# Patient Record
Sex: Male | Born: 1950 | Race: White | Hispanic: No | Smoking: Former smoker
Health system: Southern US, Community
[De-identification: ages and names within clinical notes are randomized; demographics above are authoritative.]

## PROBLEM LIST (undated history)

## (undated) DIAGNOSIS — I2510 Atherosclerotic heart disease of native coronary artery without angina pectoris: Secondary | ICD-10-CM

## (undated) DIAGNOSIS — Z9989 Dependence on other enabling machines and devices: Secondary | ICD-10-CM

## (undated) DIAGNOSIS — Z9981 Dependence on supplemental oxygen: Secondary | ICD-10-CM

## (undated) DIAGNOSIS — I255 Ischemic cardiomyopathy: Secondary | ICD-10-CM

## (undated) DIAGNOSIS — E785 Hyperlipidemia, unspecified: Secondary | ICD-10-CM

## (undated) DIAGNOSIS — E669 Obesity, unspecified: Secondary | ICD-10-CM

## (undated) DIAGNOSIS — I499 Cardiac arrhythmia, unspecified: Secondary | ICD-10-CM

## (undated) DIAGNOSIS — M1990 Unspecified osteoarthritis, unspecified site: Secondary | ICD-10-CM

## (undated) DIAGNOSIS — C801 Malignant (primary) neoplasm, unspecified: Secondary | ICD-10-CM

## (undated) DIAGNOSIS — Z720 Tobacco use: Secondary | ICD-10-CM

## (undated) DIAGNOSIS — K5090 Crohn's disease, unspecified, without complications: Secondary | ICD-10-CM

## (undated) DIAGNOSIS — E66812 Obesity, class 2: Secondary | ICD-10-CM

## (undated) DIAGNOSIS — G4733 Obstructive sleep apnea (adult) (pediatric): Secondary | ICD-10-CM

## (undated) DIAGNOSIS — Z951 Presence of aortocoronary bypass graft: Secondary | ICD-10-CM

## (undated) DIAGNOSIS — L409 Psoriasis, unspecified: Secondary | ICD-10-CM

## (undated) DIAGNOSIS — I4892 Unspecified atrial flutter: Secondary | ICD-10-CM

## (undated) DIAGNOSIS — Z9289 Personal history of other medical treatment: Secondary | ICD-10-CM

## (undated) DIAGNOSIS — I219 Acute myocardial infarction, unspecified: Secondary | ICD-10-CM

## (undated) DIAGNOSIS — I10 Essential (primary) hypertension: Secondary | ICD-10-CM

## (undated) DIAGNOSIS — J449 Chronic obstructive pulmonary disease, unspecified: Secondary | ICD-10-CM

## (undated) DIAGNOSIS — R7303 Prediabetes: Secondary | ICD-10-CM

## (undated) DIAGNOSIS — K219 Gastro-esophageal reflux disease without esophagitis: Secondary | ICD-10-CM

## (undated) HISTORY — DX: Dependence on other enabling machines and devices: Z99.89

## (undated) HISTORY — DX: Obesity, unspecified: E66.9

## (undated) HISTORY — DX: Tobacco use: Z72.0

## (undated) HISTORY — DX: Ischemic cardiomyopathy: I25.5

## (undated) HISTORY — DX: Psoriasis, unspecified: L40.9

## (undated) HISTORY — PX: XI ROBOTIC ASSISTED SIMPLE PROSTATECTOMY: SHX6713

## (undated) HISTORY — DX: Obstructive sleep apnea (adult) (pediatric): G47.33

## (undated) HISTORY — DX: Obesity, class 2: E66.812

## (undated) HISTORY — DX: Unspecified osteoarthritis, unspecified site: M19.90

## (undated) HISTORY — PX: CYSTOSCOPY: SHX5120

## (undated) HISTORY — DX: Presence of aortocoronary bypass graft: Z95.1

## (undated) HISTORY — DX: Hyperlipidemia, unspecified: E78.5

## (undated) HISTORY — DX: Atherosclerotic heart disease of native coronary artery without angina pectoris: I25.10

---

## 2000-04-04 ENCOUNTER — Ambulatory Visit (HOSPITAL_COMMUNITY): Admission: RE | Admit: 2000-04-04 | Discharge: 2000-04-04 | Payer: Self-pay | Admitting: Endocrinology

## 2000-04-04 ENCOUNTER — Encounter: Payer: Self-pay | Admitting: Endocrinology

## 2000-11-16 ENCOUNTER — Inpatient Hospital Stay (HOSPITAL_COMMUNITY): Admission: EM | Admit: 2000-11-16 | Discharge: 2000-11-17 | Payer: Self-pay | Admitting: Cardiology

## 2000-11-17 ENCOUNTER — Encounter: Payer: Self-pay | Admitting: Cardiology

## 2000-11-24 ENCOUNTER — Encounter: Payer: Self-pay | Admitting: Emergency Medicine

## 2000-11-24 ENCOUNTER — Inpatient Hospital Stay (HOSPITAL_COMMUNITY): Admission: EM | Admit: 2000-11-24 | Discharge: 2000-11-28 | Payer: Self-pay | Admitting: Emergency Medicine

## 2000-11-25 ENCOUNTER — Encounter: Payer: Self-pay | Admitting: *Deleted

## 2001-01-31 ENCOUNTER — Encounter: Payer: Self-pay | Admitting: *Deleted

## 2001-01-31 ENCOUNTER — Ambulatory Visit (HOSPITAL_COMMUNITY): Admission: RE | Admit: 2001-01-31 | Discharge: 2001-01-31 | Payer: Self-pay | Admitting: *Deleted

## 2002-07-08 ENCOUNTER — Encounter: Payer: Self-pay | Admitting: Emergency Medicine

## 2002-07-08 ENCOUNTER — Inpatient Hospital Stay (HOSPITAL_COMMUNITY): Admission: EM | Admit: 2002-07-08 | Discharge: 2002-07-12 | Payer: Self-pay | Admitting: Emergency Medicine

## 2002-07-09 ENCOUNTER — Encounter: Payer: Self-pay | Admitting: Internal Medicine

## 2005-04-30 DIAGNOSIS — Z951 Presence of aortocoronary bypass graft: Secondary | ICD-10-CM

## 2005-04-30 HISTORY — DX: Presence of aortocoronary bypass graft: Z95.1

## 2006-01-21 ENCOUNTER — Encounter: Admission: RE | Admit: 2006-01-21 | Discharge: 2006-01-21 | Payer: Self-pay | Admitting: *Deleted

## 2006-01-25 ENCOUNTER — Ambulatory Visit (HOSPITAL_COMMUNITY): Admission: RE | Admit: 2006-01-25 | Discharge: 2006-01-25 | Payer: Self-pay | Admitting: *Deleted

## 2006-02-14 ENCOUNTER — Inpatient Hospital Stay (HOSPITAL_COMMUNITY): Admission: RE | Admit: 2006-02-14 | Discharge: 2006-02-20 | Payer: Self-pay | Admitting: Cardiothoracic Surgery

## 2006-02-14 HISTORY — PX: CORONARY ARTERY BYPASS GRAFT: SHX141

## 2006-03-15 ENCOUNTER — Encounter: Admission: RE | Admit: 2006-03-15 | Discharge: 2006-03-15 | Payer: Self-pay | Admitting: Cardiothoracic Surgery

## 2006-03-28 ENCOUNTER — Encounter (HOSPITAL_COMMUNITY): Admission: RE | Admit: 2006-03-28 | Discharge: 2006-05-30 | Payer: Self-pay | Admitting: *Deleted

## 2006-04-28 ENCOUNTER — Inpatient Hospital Stay (HOSPITAL_COMMUNITY): Admission: EM | Admit: 2006-04-28 | Discharge: 2006-05-01 | Payer: Self-pay | Admitting: Emergency Medicine

## 2006-04-28 ENCOUNTER — Ambulatory Visit: Payer: Self-pay | Admitting: Internal Medicine

## 2006-04-30 DIAGNOSIS — C801 Malignant (primary) neoplasm, unspecified: Secondary | ICD-10-CM

## 2006-04-30 DIAGNOSIS — I219 Acute myocardial infarction, unspecified: Secondary | ICD-10-CM

## 2006-04-30 HISTORY — DX: Malignant (primary) neoplasm, unspecified: C80.1

## 2006-04-30 HISTORY — DX: Acute myocardial infarction, unspecified: I21.9

## 2006-05-30 ENCOUNTER — Inpatient Hospital Stay (HOSPITAL_COMMUNITY): Admission: RE | Admit: 2006-05-30 | Discharge: 2006-06-01 | Payer: Self-pay | Admitting: Urology

## 2006-07-04 ENCOUNTER — Ambulatory Visit (HOSPITAL_COMMUNITY): Admission: RE | Admit: 2006-07-04 | Discharge: 2006-07-04 | Payer: Self-pay | Admitting: *Deleted

## 2006-07-04 HISTORY — PX: CARDIAC CATHETERIZATION: SHX172

## 2010-05-21 ENCOUNTER — Encounter: Payer: Self-pay | Admitting: Cardiothoracic Surgery

## 2010-08-22 ENCOUNTER — Encounter (HOSPITAL_COMMUNITY): Payer: Self-pay

## 2010-08-29 ENCOUNTER — Ambulatory Visit (HOSPITAL_COMMUNITY)
Admission: RE | Admit: 2010-08-29 | Discharge: 2010-08-29 | Disposition: A | Discharge status: Home / Self Care | Source: Ambulatory Visit | Attending: Cardiovascular Disease | Admitting: Cardiovascular Disease

## 2010-08-29 DIAGNOSIS — R0602 Shortness of breath: Secondary | ICD-10-CM | POA: Insufficient documentation

## 2010-08-30 ENCOUNTER — Encounter (HOSPITAL_COMMUNITY): Payer: Self-pay

## 2012-04-10 ENCOUNTER — Other Ambulatory Visit (HOSPITAL_COMMUNITY): Payer: Self-pay | Admitting: Cardiovascular Disease

## 2012-04-10 DIAGNOSIS — I2510 Atherosclerotic heart disease of native coronary artery without angina pectoris: Secondary | ICD-10-CM

## 2012-05-01 ENCOUNTER — Ambulatory Visit (HOSPITAL_COMMUNITY)
Admission: RE | Admit: 2012-05-01 | Discharge: 2012-05-01 | Disposition: A | Discharge status: Home / Self Care | Source: Ambulatory Visit | Attending: Cardiovascular Disease | Admitting: Cardiovascular Disease

## 2012-05-01 DIAGNOSIS — I2510 Atherosclerotic heart disease of native coronary artery without angina pectoris: Secondary | ICD-10-CM | POA: Insufficient documentation

## 2012-05-01 MED ORDER — TECHNETIUM TC 99M SESTAMIBI GENERIC - CARDIOLITE
31.0000 | Freq: Once | INTRAVENOUS | Status: AC | PRN
  Administered 2012-05-01: 31 via INTRAVENOUS

## 2012-05-01 MED ORDER — REGADENOSON 0.4 MG/5ML IV SOLN
0.4000 mg | Freq: Once | INTRAVENOUS | Status: AC
  Administered 2012-05-01: 0.4 mg via INTRAVENOUS

## 2012-05-01 MED ORDER — TECHNETIUM TC 99M SESTAMIBI GENERIC - CARDIOLITE
10.5000 | Freq: Once | INTRAVENOUS | Status: AC | PRN
  Administered 2012-05-01: 11 via INTRAVENOUS

## 2012-05-01 NOTE — Procedures (Addendum)
[name] [address] [place] [address] [phone]  Cardiology Nuclear Med Study  Jay [name] is a 62 y.o. male     MRN : [id number]     DOB: 17-Mar-1951  Procedure Date: 05/01/2012  Nuclear Med Background Indication for Stress Test:  Graft Patency and Stent Patency History:   MI 2000, Stent 2000 and 2004, CABG 2007, CHF Cardiac Risk Factors: History of Smoking, Hypertension, Lipids and Obesity  Symptoms:  none   Nuclear Pre-Procedure Caffeine/Decaff Intake:  8:00pm NPO After: 6:00am   IV Site: R Antecubital  IV 0.9% NS with Angio Cath:  22g  Chest Size (in):  54 IV Started by: Koren Shiver, CNMT  Height: 5\' 9"  (1.753 m)  Cup Size: n/a  BMI:  Body mass index is 34.85 kg/(m^2). Weight:  236 lb (107.049 kg)   Tech Comments:  n/a    Nuclear Med Study 1 or 2 day study: 1 day  Stress Test Type:  Lexiscan  Order Authorizing Provider:  Susa Griffins, MD   Resting Radionuclide: Technetium 45m Sestamibi  Resting Radionuclide Dose: 10.5 mCi   Stress Radionuclide:  Technetium 22m Sestamibi  Stress Radionuclide Dose:31.0 mCi           Stress Protocol Rest HR: 52 Stress HR: 71  Rest BP: 129/76 Stress BP: 133/71  Exercise Time (min): n/a METS: n/a   Predicted Max HR: 161 bpm % Max HR: 44.1 bpm Rate Pressure Product: 9940   Dose of Adenosine (mg):  n/a Dose of Lexiscan: 0.4 mg  Dose of Atropine (mg): n/a Dose of Dobutamine: n/a mcg/kg/min (at max HR)  Stress Test Technologist: Esperanza Sheets, CCT Nuclear Technologist: Gonzella Lex, CNMT   Rest Procedure:  Myocardial perfusion imaging was performed at rest 45 minutes following the intravenous administration of Technetium 41m Sestamibi. Stress Procedure:  The patient received IV Lexiscan 0.4 mg over 15-seconds.  Technetium 48m Sestamibi injected at 30-seconds.  There were no significant changes with Lexiscan.  Quantitative spect images were obtained after a  45 minute delay.  Transient Ischemic Dilatation (Normal <1.22):  1.08 Lung/Heart Ratio (Normal <0.45):  0.27 QGS EDV: 157 ml QGS ESV:  96 ml LV Ejection Fraction: 39%  Signed by: Granjeno Cellar Vear Clock, CNMT  PHYSICIAN INTERPRETATION:  Rest ECG: NSR - Normal EKG  Stress ECG: No significant change from baseline ECG  QPS Raw Data Images:  There is interference from nuclear activity from structures below the diaphragm. This does not affect the ability to read the study. Patient motion noted.  Increased RV tracer uptake, suggests elevated pulmonary pressures. Stress Images:  There is decreased uptake in the lateral wall. -- basal to apical There is decreased uptake in the inferior - inferolateral wall mid to apical.  Moderate to large defect with moderate intensity. Rest Images:  Comparison with the stress images reveals no significant change.  Subtraction (SDS):  There is a fixed defect that is most consistent with a previous infarction. with perhaps trivial to mild peri-infarct ischemia in the apical inferolateral wall.  Impression Exercise Capacity:  Lexiscan with no exercise. BP Response:  Normal blood pressure response. Clinical Symptoms:  "Feeling a Bit Strange"; no Chest pain or SOB ECG Impression:  No significant ST segment change suggestive of ischemia. Comparison with Prior Nuclear Study: No significant change from previous study  Overall Impression:  Low risk stress nuclear study.  LV Wall Motion:  Global Hypokinesis with EF estimated at ~40%, decreased thickening  with mild to moderate hypokinesis in the lateral as well as apical inferior-inferolateral wall.   [name], M.D., M.S. [facility] [address] [place], [place]  [address]  [phone] Pager # [phone] 05/01/2012 1:55 PM

## 2012-08-13 ENCOUNTER — Ambulatory Visit (HOSPITAL_COMMUNITY)
Admission: RE | Admit: 2012-08-13 | Discharge: 2012-08-13 | Disposition: A | Discharge status: Home / Self Care | Source: Ambulatory Visit | Attending: Cardiovascular Disease | Admitting: Cardiovascular Disease

## 2012-08-13 ENCOUNTER — Other Ambulatory Visit (HOSPITAL_COMMUNITY): Payer: Self-pay | Admitting: Cardiovascular Disease

## 2012-08-13 DIAGNOSIS — R0602 Shortness of breath: Secondary | ICD-10-CM

## 2012-08-13 DIAGNOSIS — I509 Heart failure, unspecified: Secondary | ICD-10-CM

## 2012-08-13 DIAGNOSIS — I25810 Atherosclerosis of coronary artery bypass graft(s) without angina pectoris: Secondary | ICD-10-CM | POA: Insufficient documentation

## 2012-08-13 NOTE — Progress Notes (Signed)
Jay [name]   2D echo completed 08/13/2012.   Cindy Tashiba Timoney, RDCS  

## 2012-08-29 ENCOUNTER — Other Ambulatory Visit (HOSPITAL_COMMUNITY): Payer: Self-pay | Admitting: Cardiovascular Disease

## 2012-08-29 ENCOUNTER — Encounter: Payer: Self-pay | Admitting: Cardiovascular Disease

## 2012-08-29 DIAGNOSIS — I714 Abdominal aortic aneurysm, without rupture: Secondary | ICD-10-CM

## 2012-09-05 ENCOUNTER — Ambulatory Visit (HOSPITAL_COMMUNITY)
Admission: RE | Admit: 2012-09-05 | Discharge: 2012-09-05 | Disposition: A | Discharge status: Home / Self Care | Source: Ambulatory Visit | Attending: Cardiovascular Disease | Admitting: Cardiovascular Disease

## 2012-09-05 DIAGNOSIS — I714 Abdominal aortic aneurysm, without rupture, unspecified: Secondary | ICD-10-CM | POA: Insufficient documentation

## 2012-09-05 NOTE — Progress Notes (Signed)
Aorta Duplex Completed. Jay [name]  

## 2012-10-02 ENCOUNTER — Other Ambulatory Visit: Payer: Self-pay | Admitting: Cardiovascular Disease

## 2012-10-02 LAB — TSH: TSH: 0.421 u[IU]/mL (ref 0.350–4.500)

## 2013-01-22 ENCOUNTER — Encounter: Payer: Self-pay | Admitting: Cardiovascular Disease

## 2013-02-24 ENCOUNTER — Ambulatory Visit (INDEPENDENT_AMBULATORY_CARE_PROVIDER_SITE_OTHER): Admitting: Physician Assistant

## 2013-02-24 ENCOUNTER — Encounter: Payer: Self-pay | Admitting: Physician Assistant

## 2013-02-24 VITALS — BP 102/70 | HR 52 | Ht 69.0 in | Wt 250.0 lb

## 2013-02-24 DIAGNOSIS — R6889 Other general symptoms and signs: Secondary | ICD-10-CM

## 2013-02-24 DIAGNOSIS — I5041 Acute combined systolic (congestive) and diastolic (congestive) heart failure: Secondary | ICD-10-CM | POA: Insufficient documentation

## 2013-02-24 DIAGNOSIS — I255 Ischemic cardiomyopathy: Secondary | ICD-10-CM | POA: Insufficient documentation

## 2013-02-24 DIAGNOSIS — G4730 Sleep apnea, unspecified: Secondary | ICD-10-CM | POA: Insufficient documentation

## 2013-02-24 DIAGNOSIS — E669 Obesity, unspecified: Secondary | ICD-10-CM | POA: Insufficient documentation

## 2013-02-24 DIAGNOSIS — I5043 Acute on chronic combined systolic (congestive) and diastolic (congestive) heart failure: Secondary | ICD-10-CM

## 2013-02-24 DIAGNOSIS — I2589 Other forms of chronic ischemic heart disease: Secondary | ICD-10-CM

## 2013-02-24 DIAGNOSIS — I2510 Atherosclerotic heart disease of native coronary artery without angina pectoris: Secondary | ICD-10-CM

## 2013-02-24 DIAGNOSIS — Z87891 Personal history of nicotine dependence: Secondary | ICD-10-CM

## 2013-02-24 DIAGNOSIS — IMO0001 Reserved for inherently not codable concepts without codable children: Secondary | ICD-10-CM

## 2013-02-24 DIAGNOSIS — E785 Hyperlipidemia, unspecified: Secondary | ICD-10-CM

## 2013-02-24 DIAGNOSIS — Z951 Presence of aortocoronary bypass graft: Secondary | ICD-10-CM

## 2013-02-24 MED ORDER — POTASSIUM CHLORIDE ER 10 MEQ PO TBCR: 20.0000 meq | EXTENDED_RELEASE_TABLET | Freq: Two times a day (BID) | ORAL | Status: DC

## 2013-02-24 NOTE — Assessment & Plan Note (Signed)
I will increase patient's Lasix to 40 mg twice daily for the next week in the chart his weight loss every morning.  We'll check a basic metabolic panel next week and have him followup with [place] in one week.  Added potassium as well.

## 2013-02-24 NOTE — Progress Notes (Signed)
Date:  02/24/2013   ID:  Jay [name], DOB 12/06/1950, MRN [id number]  PCP:  Jay Mayotte, MD  Primary Cardiologist:  Jay [name]     History of Present Illness: Jay [name] is a 62 y.o. male history of tobacco abuse, obesity, sleep apnea on CPAP, syncopal episode, psoriasis and has been on long-term Enbrel, arthritis, hyperlipidemia, coronary artery disease with remote CABG Dr. Zenaida [name] trite x4 2007, ischemic cardiomyopathy with an ejection fraction of 40-45%, prostate cancer, cancer of the bladder, robotic prostatectomy 2008, GERD. His last catheterization was 2008 showed occluded SVG to the OM, otherwise patent grafts.  Patient presents today with worsening shortness of breath, orthopnea, lower extremity edema and decreased O2 saturation which he checks at home.  Clinic his O2 saturation was 88%. This is seen at [facility] the other day and they told him that his shortness of breath is related to heart failure. He did order checks x-ray and BMP apparently. Patient's weight is up approximately 25-30 pounds.  The patient currently denies nausea, vomiting, fever, chest pain, dizziness, cough, congestion, abdominal pain, hematochezia, melena, claudication.  Wt Readings from Last 3 Encounters:  02/24/13 250 lb (113.399 kg)  05/01/12 236 lb (107.049 kg)     Past Medical History  Diagnosis Date  . S/P CABG x 4 2007    Dr.  Donata [name]  . Coronary artery disease   . Obstructive sleep apnea on CPAP   . Hyperlipidemia   . Psoriasis   . Arthritis   . Ischemic cardiomyopathy     Ejection fraction 40-45%  . Tobacco abuse   . Obesity, Class II, BMI 35-39.9     Current Outpatient Prescriptions  Medication Sig Dispense Refill  . candesartan (ATACAND) 8 MG tablet Take 8 mg by mouth daily.      . clopidogrel (PLAVIX) 75 MG tablet Take 75 mg by mouth daily.      [name] escitalopram (LEXAPRO) 10 MG tablet Take 10 mg by mouth daily.      [name] esomeprazole (NEXIUM) 40 MG capsule Take  40 mg by mouth daily before breakfast.      . etanercept (ENBREL) 50 MG/ML injection Inject 50 mg into the skin once a week.      . furosemide (LASIX) 20 MG tablet Take 20 mg by mouth.      . metoprolol tartrate (LOPRESSOR) 25 MG tablet Take 25 mg by mouth 2 (two) times daily.      . mirabegron ER (MYRBETRIQ) 50 MG TB24 tablet Take 50 mg by mouth daily. Taking two po daily      . Multiple Vitamin (MULTIVITAMIN) tablet Take 1 tablet by mouth daily.      . rosuvastatin (CRESTOR) 10 MG tablet Take 10 mg by mouth daily.      . potassium chloride (K-DUR) 10 MEQ tablet Take 2 tablets (20 mEq total) by mouth 2 (two) times daily.  60 tablet  4   No current facility-administered medications for this visit.    Allergies:    Allergies  Allergen Reactions  . Lipitor [Atorvastatin]     Myalgia    Social History:  The patient     Family history:  No family history on file.  ROS:  Please see the history of present illness.  All other systems reviewed and negative.   PHYSICAL EXAM: VS:  BP 102/70  Pulse 52  Ht 5\' 9"  (1.753 m)  Wt 250 lb (113.399 kg)  BMI 36.9 kg/m2  SpO2 87%  Well nourished, well developed, in no acute distress HEENT: Pupils are equal round react to light accommodation extraocular movements are intact.  Neck: + JVDNo cervical lymphadenopathy. Cardiac: Regular rate and rhythm without murmurs rubs or gallops. Lungs:  clear to auscultation bilaterally, no wheezing, rhonchi or rales Abd: Distended, L. to percussion, nontender, positive bowel sounds all quadrants, Ext: 1+ lower extremity edema.  2+ radial and dorsalis pedis pulses. Skin: warm and dry Neuro:  Grossly normal  EKG:  Sinus rhythm T-wave changes in the tube through the 6 rate 63 beats per minute  ASSESSMENT AND PLAN:  Problem List Items Addressed This Visit   Sleep apnea   S/P CABG x 4, 2007   Obesity, Class II, BMI 35-39.9, with comorbidity   Hyperlipidemia   Relevant Medications      furosemide (LASIX) 20  MG tablet      candesartan (ATACAND) 8 MG tablet      metoprolol tartrate (LOPRESSOR) 25 MG tablet      rosuvastatin (CRESTOR) 10 MG tablet   History of tobacco use   Coronary artery disease   Relevant Medications      furosemide (LASIX) 20 MG tablet      candesartan (ATACAND) 8 MG tablet      metoprolol tartrate (LOPRESSOR) 25 MG tablet      rosuvastatin (CRESTOR) 10 MG tablet   Cardiomyopathy, ischemic, ejection fraction 40-45%   Relevant Medications      furosemide (LASIX) 20 MG tablet      candesartan (ATACAND) 8 MG tablet      metoprolol tartrate (LOPRESSOR) 25 MG tablet      rosuvastatin (CRESTOR) 10 MG tablet   Acute on chronic systolic and diastolic heart failure, NYHA class 2 - Primary     I will increase patient's Lasix to 40 mg twice daily for the next week in the chart his weight loss every morning.  We'll check a basic metabolic panel next week and have him followup with [place] in one week.  Added potassium as well.    Relevant Medications      furosemide (LASIX) 20 MG tablet      candesartan (ATACAND) 8 MG tablet      metoprolol tartrate (LOPRESSOR) 25 MG tablet      rosuvastatin (CRESTOR) 10 MG tablet    Other Visit Diagnoses   Other abnormal clinical finding        Relevant Orders       EKG 12-Lead       Basic Metabolic Panel (BMET)

## 2013-02-24 NOTE — Patient Instructions (Signed)
1.  Increase lasix(furosemide) to 40mg  twice daily.    2.  Weigh yourself every morning and track your weight loss.  When your weight decreases by 15 pounds, lower the dose to 40mg  daily.  If the weight does not come off, call the office.    3.  Decrease sodium intake to2000mg  daily.  4.  Follow up in one week.

## 2013-02-25 ENCOUNTER — Ambulatory Visit: Admitting: Cardiology

## 2013-02-27 ENCOUNTER — Telehealth: Payer: Self-pay | Admitting: Physician Assistant

## 2013-02-27 MED ORDER — FUROSEMIDE 20 MG PO TABS: ORAL_TABLET | ORAL | Status: DC

## 2013-02-27 MED ORDER — POTASSIUM CHLORIDE ER 10 MEQ PO TBCR: 20.0000 meq | EXTENDED_RELEASE_TABLET | Freq: Two times a day (BID) | ORAL | Status: DC

## 2013-02-27 NOTE — Telephone Encounter (Signed)
Called patient to clarify which medications needed refills. Patient stated that furosemide was increased and potassium was added and neither was at pharmacy. Informed patient would send in refills for furosemide, given the dosage increase, and potassium would be sent to pharmacy.

## 2013-02-27 NOTE — Telephone Encounter (Signed)
Did we call in his medications to [facility] on [address] in [place]?  He was seen the other day and was told that we would take care of this.

## 2013-03-04 ENCOUNTER — Encounter: Payer: Self-pay | Admitting: Cardiology

## 2013-03-04 ENCOUNTER — Ambulatory Visit (INDEPENDENT_AMBULATORY_CARE_PROVIDER_SITE_OTHER): Admitting: Cardiology

## 2013-03-04 VITALS — BP 100/60 | HR 80 | Ht 69.0 in | Wt 246.0 lb

## 2013-03-04 DIAGNOSIS — I255 Ischemic cardiomyopathy: Secondary | ICD-10-CM

## 2013-03-04 DIAGNOSIS — G4730 Sleep apnea, unspecified: Secondary | ICD-10-CM

## 2013-03-04 DIAGNOSIS — I5043 Acute on chronic combined systolic (congestive) and diastolic (congestive) heart failure: Secondary | ICD-10-CM

## 2013-03-04 DIAGNOSIS — I2589 Other forms of chronic ischemic heart disease: Secondary | ICD-10-CM

## 2013-03-04 DIAGNOSIS — IMO0001 Reserved for inherently not codable concepts without codable children: Secondary | ICD-10-CM

## 2013-03-04 DIAGNOSIS — Z951 Presence of aortocoronary bypass graft: Secondary | ICD-10-CM

## 2013-03-04 LAB — BASIC METABOLIC PANEL
BUN: 19 mg/dL (ref 6–23)
Calcium: 9.7 mg/dL (ref 8.4–10.5)
Glucose, Bld: 79 mg/dL (ref 70–99)
Potassium: 4.3 mEq/L (ref 3.5–5.3)
Sodium: 139 mEq/L (ref 135–145)

## 2013-03-04 MED ORDER — METOLAZONE 2.5 MG PO TABS: 2.5000 mg | ORAL_TABLET | Freq: Every day | ORAL | Status: DC

## 2013-03-04 NOTE — Assessment & Plan Note (Signed)
He has lost 5 lbs, still has DOE

## 2013-03-04 NOTE — Assessment & Plan Note (Signed)
Negative Myoview Jan 2014

## 2013-03-04 NOTE — Progress Notes (Signed)
03/04/2013 [name]   [birth year]  [id number]  Primary Physicia Dalbert Mayotte, MD Primary Cardiologist:   HPI:  Jay [name] is a 63 y.o. male history of tobacco abuse, obesity, CPAP intolerant,  psoriasis on long-term Enbrel, psoriatic arthritis, hyperlipidemia, coronary artery disease with CABG Dr. Zenaida Niece tright x4 2007. Cath in 2008 revealed an occluded SVG-OM with a patent LIMA-LAD and a patent SCG-PDA. Myoview done Jan 2014 was low risk. He has an ischemic cardiomyopathy with an ejection fraction of 40-45% and grade 1 diastolic dysfunction by echo April 2014.          He just saw Wilburt Finlay 02/24/13. He was sent to [place] by his pulmonologist at [facility] for increasing dyspnea on exertion and hypoxia with O2 sats in the 80s.  Judie Grieve felt he was volume overloaded. His Lasix was increased. He is here today for follow up. His weight is down 4 lbs and he his edema is improved. He feels better but he still has DOE. His office vist with Dr Leisa Lenz in Sep listed his wgt at 240. Previous wgts have been 230-235. I suspect he is still volume overloaded.   Current Outpatient Prescriptions  Medication Sig Dispense Refill  . candesartan (ATACAND) 8 MG tablet Take 8 mg by mouth daily.      . clopidogrel (PLAVIX) 75 MG tablet Take 75 mg by mouth daily.      [name] escitalopram (LEXAPRO) 10 MG tablet Take 10 mg by mouth daily.      [name] esomeprazole (NEXIUM) 40 MG capsule Take 40 mg by mouth daily before breakfast.      . etanercept (ENBREL) 50 MG/ML injection Inject 50 mg into the skin once a week.      . furosemide (LASIX) 20 MG tablet Take 2 tablets by mouth twice daily until your weight drops by 15lbs, then decrease to 40mg  daily.  60 tablet  4  . metoprolol tartrate (LOPRESSOR) 25 MG tablet Take 25 mg by mouth 2 (two) times daily.      . mirabegron ER (MYRBETRIQ) 50 MG TB24 tablet Take 50 mg by mouth daily. Taking two po daily      . Multiple Vitamin (MULTIVITAMIN) tablet Take 1 tablet by mouth daily.       . potassium chloride (K-DUR) 10 MEQ tablet Take 2 tablets (20 mEq total) by mouth 2 (two) times daily.  120 tablet  6  . rosuvastatin (CRESTOR) 10 MG tablet Take 10 mg by mouth daily.      . metolazone (ZAROXOLYN) 2.5 MG tablet Take 1 tablet (2.5 mg total) by mouth daily.  4 tablet  0   No current facility-administered medications for this visit.    Allergies  Allergen Reactions  . Lipitor [Atorvastatin]     Myalgia    History   Social History  . Marital Status: Married    Spouse Name: N/A    Number of Children: N/A  . Years of Education: N/A   Occupational History  . Not on file.   Social History Main Topics  . Smoking status: Former Games developer  . Smokeless tobacco: Not on file  . Alcohol Use: Not on file  . Drug Use: Not on file  . Sexual Activity: Not on file   Other Topics Concern  . Not on file   Social History Narrative  . No narrative on file     Review of Systems: General: negative for chills, fever, night sweats or weight changes.  Dermatological: negative for rash  Respiratory: negative for cough or wheezing Urologic: negative for hematuria Abdominal: negative for nausea, vomiting, diarrhea, bright red blood per rectum, melena, or hematemesis Neurologic: negative for visual changes, syncope, or dizziness All other systems reviewed and are otherwise negative except as noted above.    Blood pressure 100/60, pulse 80, height 5\' 9"  (1.753 m), weight 246 lb (111.585 kg).  General appearance: alert, cooperative, no distress and morbidly obese Lungs: clear to auscultation bilaterally Heart: regular rate and rhythm Extremities: no edema    ASSESSMENT AND PLAN:   Acute on chronic systolic and diastolic heart failure, NYHA class 2 He has lost 5 lbs, still has DOE  Cardiomyopathy, ischemic, ejection fraction 40-45% .  Obesity, Class II, BMI 35-39.9, with comorbidity .  Sleep apnea He has not used C-pap X 2 months  S/P CABG x 4, 2007 Negative Myoview  Jan 2014   PLAN  I added Zaroxolyn 2.5 mg daily for 3 days. He will come back Monday to see how he is doing. He will get his BMP drawn today. We discussed the importance of a low salt diet and he admits he eats a lot of pre prepared foods. He also told me he has been non compliant with C-pap for months.   [name] KPA-C 03/04/2013 3:45 PM

## 2013-03-04 NOTE — Assessment & Plan Note (Signed)
He has not used C-pap X 2 months

## 2013-03-04 NOTE — Patient Instructions (Signed)
Have BMP today. Take Zaroxolyn 2.5 mg a day for the next three days. Take 30 minutes prior to Lasix. Return for follow up on Monday- see Judie Grieve

## 2013-03-09 ENCOUNTER — Ambulatory Visit (INDEPENDENT_AMBULATORY_CARE_PROVIDER_SITE_OTHER): Admitting: Physician Assistant

## 2013-03-09 ENCOUNTER — Encounter: Payer: Self-pay | Admitting: Physician Assistant

## 2013-03-09 VITALS — BP 100/60 | HR 80 | Ht 69.0 in | Wt 245.0 lb

## 2013-03-09 DIAGNOSIS — I5043 Acute on chronic combined systolic (congestive) and diastolic (congestive) heart failure: Secondary | ICD-10-CM

## 2013-03-09 MED ORDER — FUROSEMIDE 20 MG PO TABS: 40.0000 mg | ORAL_TABLET | Freq: Every day | ORAL | Status: DC

## 2013-03-09 NOTE — Assessment & Plan Note (Signed)
The patient has had improvement in breathing and resolution of LEE.  I will reduce his Lasix to 40mg  daily and stop zaroxolyn. He is to continue to monitor his weight and follow a low sodium diet.  Follow up in 6 months.

## 2013-03-09 NOTE — Patient Instructions (Signed)
1.  Reduce lasix to 40mg  daily.  Continue to weigh yourself daily.  Call our office if you notice your weight increasing. 2.  Follow up with Dr. Rennis Golden in 6 months.

## 2013-03-09 NOTE — Progress Notes (Signed)
Date:  03/09/2013   ID:  Jay [name], DOB Sep 25, 1950, MRN [id number]  PCP:  Jay Mayotte, MD  Primary Cardiologist:  Jay [name]     History of Present Illness: Jay [name] is a 62 y.o. male with a history of tobacco abuse, obesity, sleep apnea on CPAP, syncopal episode, psoriasis and has been on long-term Enbrel, arthritis, hyperlipidemia, coronary artery disease with remote CABG Dr. Zenaida [name] trite x4 2007, ischemic cardiomyopathy with an ejection fraction of 40-45%, prostate cancer, cancer of the bladder, robotic prostatectomy 2008, GERD. His last catheterization was 2008 showed occluded SVG to the OM, otherwise patent grafts.   The patient presents today for follow up after seeing me on Oct 28 and Jay [name] on 11/5 for CHF exacerbation.  He has had a modest reduction in weight, resolution of LEE and improvement in breathing.  Jay [name] had added a short course of Zaroxolyn to the increased Lasix when seen in follow up.  The patient currently denies nausea, vomiting, fever, chest pain, shortness of breath, orthopnea, dizziness, PND, cough, congestion, abdominal pain, hematochezia, melena, lower extremity edema, claudication.  Wt Readings from Last 3 Encounters:  03/09/13 245 lb (111.131 kg)  03/04/13 246 lb (111.585 kg)  02/24/13 250 lb (113.399 kg)     Past Medical History  Diagnosis Date  . S/P CABG x 4 2007    Dr.  Donata [name]  . Coronary artery disease   . Obstructive sleep apnea on CPAP   . Hyperlipidemia   . Psoriasis   . Arthritis   . Ischemic cardiomyopathy     Ejection fraction 40-45%  . Tobacco abuse   . Obesity, Class II, BMI 35-39.9     Current Outpatient Prescriptions  Medication Sig Dispense Refill  . candesartan (ATACAND) 8 MG tablet Take 8 mg by mouth daily.      . clopidogrel (PLAVIX) 75 MG tablet Take 75 mg by mouth daily.      [name] escitalopram (LEXAPRO) 10 MG tablet Take 10 mg by mouth daily.      [name] esomeprazole (NEXIUM) 40 MG capsule Take 40 mg by  mouth daily before breakfast.      . etanercept (ENBREL) 50 MG/ML injection Inject 50 mg into the skin once a week.      . metolazone (ZAROXOLYN) 2.5 MG tablet Take 1 tablet (2.5 mg total) by mouth daily.  4 tablet  0  . metoprolol tartrate (LOPRESSOR) 25 MG tablet Take 25 mg by mouth 2 (two) times daily.      . mirabegron ER (MYRBETRIQ) 50 MG TB24 tablet Take 50 mg by mouth daily. Taking two po daily      . Multiple Vitamin (MULTIVITAMIN) tablet Take 1 tablet by mouth daily.      . potassium chloride (K-DUR) 10 MEQ tablet Take 2 tablets (20 mEq total) by mouth 2 (two) times daily.  120 tablet  6  . rosuvastatin (CRESTOR) 10 MG tablet Take 10 mg by mouth daily.      . furosemide (LASIX) 20 MG tablet Take 2 tablets (40 mg total) by mouth daily.  30 tablet  3   No current facility-administered medications for this visit.    Allergies:    Allergies  Allergen Reactions  . Lipitor [Atorvastatin]     Myalgia    Social History:  The patient  reports that he has quit smoking. He does not have any smokeless tobacco history on file.   Family history:  No family history on  file.  ROS:  Please see the history of present illness.  All other systems reviewed and negative.   PHYSICAL EXAM: VS:  BP 100/60  Pulse 80  Ht 5\' 9"  (1.753 m)  Wt 245 lb (111.131 kg)  BMI 36.16 kg/m2 Obese, well developed, in no acute distress HEENT: Pupils are equal round react to light accommodation extraocular movements are intact.  Neck: no JVDNo cervical lymphadenopathy. Cardiac: Regular rate and rhythm without murmurs rubs or gallops. Lungs:  clear to auscultation bilaterally, no wheezing, rhonchi or rales Abd: soft, nontender, positive bowel sounds all quadrants, no hepatosplenomegaly Ext: no lower extremity edema.  2+ radial and dorsalis pedis pulses. Skin: warm and dry Neuro:  Grossly normal     ASSESSMENT AND PLAN:  Problem List Items Addressed This Visit   Acute on chronic systolic and diastolic  heart failure, NYHA class 2 - Primary     The patient has had improvement in breathing and resolution of LEE.  I will reduce his Lasix to 40mg  daily and stop zaroxolyn. He is to continue to monitor his weight and follow a low sodium diet.  Follow up in 6 months.      Relevant Medications      furosemide (LASIX) tablet

## 2013-03-11 ENCOUNTER — Other Ambulatory Visit (HOSPITAL_COMMUNITY): Payer: Self-pay | Admitting: Internal Medicine

## 2013-03-11 DIAGNOSIS — I25810 Atherosclerosis of coronary artery bypass graft(s) without angina pectoris: Secondary | ICD-10-CM

## 2013-03-18 ENCOUNTER — Ambulatory Visit (HOSPITAL_COMMUNITY)
Admission: RE | Admit: 2013-03-18 | Discharge: 2013-03-18 | Disposition: A | Discharge status: Home / Self Care | Source: Ambulatory Visit | Attending: Cardiovascular Disease | Admitting: Cardiovascular Disease

## 2013-03-18 DIAGNOSIS — I6529 Occlusion and stenosis of unspecified carotid artery: Secondary | ICD-10-CM | POA: Insufficient documentation

## 2013-03-18 DIAGNOSIS — I2510 Atherosclerotic heart disease of native coronary artery without angina pectoris: Secondary | ICD-10-CM | POA: Insufficient documentation

## 2013-03-18 DIAGNOSIS — I25810 Atherosclerosis of coronary artery bypass graft(s) without angina pectoris: Secondary | ICD-10-CM

## 2013-03-18 DIAGNOSIS — I658 Occlusion and stenosis of other precerebral arteries: Secondary | ICD-10-CM | POA: Insufficient documentation

## 2013-03-18 NOTE — Progress Notes (Signed)
Carotid Duplex Completed. °Brianna L Mazza,RVT °

## 2013-11-12 ENCOUNTER — Other Ambulatory Visit: Payer: Self-pay | Admitting: *Deleted

## 2013-11-12 MED ORDER — FUROSEMIDE 20 MG PO TABS: 40.0000 mg | ORAL_TABLET | Freq: Every day | ORAL | Status: DC

## 2013-11-12 MED ORDER — POTASSIUM CHLORIDE ER 10 MEQ PO TBCR: 20.0000 meq | EXTENDED_RELEASE_TABLET | Freq: Two times a day (BID) | ORAL | Status: DC

## 2013-11-12 NOTE — Telephone Encounter (Signed)
Rx was sent to pharmacy electronically. 

## 2014-01-12 ENCOUNTER — Encounter: Payer: Self-pay | Admitting: *Deleted

## 2014-01-18 ENCOUNTER — Encounter: Payer: Self-pay | Admitting: Cardiovascular Disease

## 2014-01-18 ENCOUNTER — Telehealth: Payer: Self-pay | Admitting: *Deleted

## 2014-01-18 ENCOUNTER — Ambulatory Visit (INDEPENDENT_AMBULATORY_CARE_PROVIDER_SITE_OTHER): Admitting: Cardiovascular Disease

## 2014-01-18 VITALS — BP 106/60 | HR 84 | Ht 69.0 in | Wt 250.2 lb

## 2014-01-18 DIAGNOSIS — I2510 Atherosclerotic heart disease of native coronary artery without angina pectoris: Secondary | ICD-10-CM

## 2014-01-18 DIAGNOSIS — E785 Hyperlipidemia, unspecified: Secondary | ICD-10-CM

## 2014-01-18 DIAGNOSIS — R0609 Other forms of dyspnea: Secondary | ICD-10-CM

## 2014-01-18 DIAGNOSIS — Z951 Presence of aortocoronary bypass graft: Secondary | ICD-10-CM

## 2014-01-18 DIAGNOSIS — Z79899 Other long term (current) drug therapy: Secondary | ICD-10-CM

## 2014-01-18 DIAGNOSIS — R0989 Other specified symptoms and signs involving the circulatory and respiratory systems: Principal | ICD-10-CM

## 2014-01-18 DIAGNOSIS — I5042 Chronic combined systolic (congestive) and diastolic (congestive) heart failure: Secondary | ICD-10-CM

## 2014-01-18 DIAGNOSIS — I255 Ischemic cardiomyopathy: Secondary | ICD-10-CM

## 2014-01-18 DIAGNOSIS — IMO0001 Reserved for inherently not codable concepts without codable children: Secondary | ICD-10-CM

## 2014-01-18 DIAGNOSIS — G4730 Sleep apnea, unspecified: Secondary | ICD-10-CM

## 2014-01-18 DIAGNOSIS — I2589 Other forms of chronic ischemic heart disease: Secondary | ICD-10-CM

## 2014-01-18 NOTE — Progress Notes (Signed)
Patient ID: Jay [name], male   DOB: Aug 06, 1950, 63 y.o.   MRN: [id number]      Reason for office visit CAD, CHF  This is my first direct encounter with JayAustin, a long-time patient of Dr. Terance Ice. He has a history of coronary artery disease and underwent multivessel bypass surgery in 2007 (LIMA to LAD, SVG to PDA, SVG to OM, SVG to ramus intermedius) and underwent repeat cardiac catheterization in 2008: He had interim total occlusion of the vein graft to the OM and ramus intermedius, patent but mildly diseased SVG to PDA and a patent LIMA to the LAD. At that time his left ventricular ejection fraction was 40-45% and he had evidence of elevated left ventricular end-diastolic pressure. In 2014 he underwent a nuclear stress test that showed a large area of inferolateral scar without meaningful ischemia and an ejection fraction of 40%. His echocardiogram in April of 2014 showed a similar ejection fraction of 40-45% with a mitral inflow pattern suggestive of mild diastolic dysfunction. He did not have meaningful valvular abnormalities. His right ventricle was described as moderately dilated. The PA pressure was not calculated.  In November of 2014 he had acute on chronic heart failure exacerbation which was treated as an outpatient with brief increase in diuretics. He seemed to have worsening heart. Weight of 250 pounds, better when his weight decreased to 245 pounds. Today he is back up to 250 pounds.  He also has obstructive sleep apnea and reports only roughly 10-15% compliance with CPAP therapy. He is moderately obese with a BMI of about 37. He is on chronic treatment with Enbrel for psoriasis and psoriatic arthritis. He has compensated systemic hypertension and takes a statin for hyperlipidemia but does not have diabetes mellitus. He is on chronic low-dose loop diuretic therapy. He has a history of previous tobacco abuse and COPD but quit smoking more than 10 years ago. He had prostate  cancer and urinary bladder cancer and underwent a robotic prostatectomy in 2008.  His major complaint is exertional dyspnea, and like a functional class II. He comes short of breath after walking across the parking lot. He does not have dyspnea at rest and specifically paroxysmal atrial dyspnea or orthopnea. He has poor sleep which she attributed to go and to the bathroom frequently. He has chronic depression. He complains of daytime sleepiness and fatigue and has had inappropriate during the day, although he has never fallen asleep while driving.  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   Allergies  Allergen Reactions  . Lipitor [Atorvastatin]     Myalgia    Current Outpatient Prescriptions  Medication Sig Dispense Refill  . albuterol (PROAIR HFA) 108 (90 BASE) MCG/ACT inhaler Inhale 1 puff into the lungs daily.      . ARIPiprazole (ABILIFY) 5 MG tablet Take 1 tablet by mouth daily.      [name] atorvastatin (LIPITOR) 20 MG tablet Take 1 tablet by mouth daily.      . candesartan (ATACAND) 8 MG tablet Take 8 mg by mouth daily.      . clopidogrel (PLAVIX) 75 MG tablet Take 75 mg by mouth daily.      [name] darifenacin (ENABLEX) 15 MG 24 hr tablet Take 15 mg by mouth daily.      [name] escitalopram (LEXAPRO) 10 MG tablet Take 10 mg by mouth daily.      [name] esomeprazole (NEXIUM) 40 MG capsule  Take 40 mg by mouth daily before breakfast.      . etanercept (ENBREL) 50 MG/ML injection Inject 50 mg into the skin once a week.      [name] FLUTICASONE FUROATE IN Place 1 puff into the nose 2 (two) times daily.      . Fluticasone-Salmeterol (ADVAIR DISKUS) 250-50 MCG/DOSE AEPB Inhale 1 puff into the lungs daily.      . furosemide (LASIX) 20 MG tablet Take 2 tablets (40 mg total) by mouth daily.  180 tablet  1  . metoprolol succinate (TOPROL-XL) 25 MG 24 hr tablet Take 1 tablet by mouth daily.      . mirabegron ER (MYRBETRIQ) 50 MG TB24 tablet Take 50 mg by  mouth daily. Taking two po daily      . potassium chloride (K-DUR) 10 MEQ tablet Take 2 tablets (20 mEq total) by mouth 2 (two) times daily.  360 tablet  1  . tiotropium (SPIRIVA HANDIHALER) 18 MCG inhalation capsule Place 1 Inhaler into inhaler and inhale daily.       No current facility-administered medications for this visit.    Past Medical History  Diagnosis Date  . S/P CABG x 4 2007    Dr.  [name]  . Coronary artery disease   . Obstructive sleep apnea on CPAP   . Hyperlipidemia   . Psoriasis   . Arthritis   . Ischemic cardiomyopathy     Ejection fraction 40-45%  . Tobacco abuse   . Obesity, Class II, BMI 35-39.9     Past Surgical History  Procedure Laterality Date  . Coronary artery bypass graft  02/14/2006    LIMA to LAD,SVG to posterior descending,SVG to CX,SVG to ramus intermediate.  . Cardiac catheterization  07/04/2006    significant 3 vessel disease: totally vein graft to the ramus intermedius,toalled vein graft to the obtuse marginal, mildly diseased vein graft of the posterior descending artery, mildly depressed LV systolic fx.    Family History  Problem Relation Age of Onset  . Heart attack Father     History   Social History  . Marital Status: Married    Spouse Name: N/A    Number of Children: N/A  . Years of Education: N/A   Occupational History  . Not on file.   Social History Main Topics  . Smoking status: Former Research scientist (life sciences)  . Smokeless tobacco: Not on file  . Alcohol Use: No  . Drug Use: No  . Sexual Activity: Not on file   Other Topics Concern  . Not on file   Social History Narrative  . No narrative on file    Review of systems: The patient specifically denies any chest pain at rest or with exertion, dyspnea at rest, orthopnea, paroxysmal nocturnal dyspnea, syncope, palpitations, focal neurological deficits, intermittent claudication, lower extremity edema, unexplained weight gain, cough, hemoptysis or wheezing.  The patient also  denies abdominal pain, nausea, vomiting, dysphagia, diarrhea, constipation, polyuria, polydipsia, dysuria, hematuria, frequency, urgency, abnormal bleeding or bruising, fever, chills, unexpected weight changes, mood swings, change in skin or hair texture, change in voice quality, auditory or visual problems, allergic reactions or rashes, new musculoskeletal complaints other than usual "aches and pains".   PHYSICAL EXAM BP 106/60  Pulse 84  Ht 5\' 9"  (1.753 m)  Wt 113.49 kg (250 lb 3.2 oz)  BMI 36.93 kg/m2  General: Alert, oriented x3, no distress Head: no evidence of trauma, PERRL, EOMI, no exophtalmos or lid lag, no myxedema, no xanthelasma; normal  ears, nose and oropharynx Neck: normal jugular venous pulsations and no hepatojugular reflux; brisk carotid pulses without delay and no carotid bruits Chest: clear to auscultation, no signs of consolidation by percussion or palpation, normal fremitus, symmetrical and full respiratory excursions. Healed sternotomy scar Cardiovascular: normal position and quality of the apical impulse, regular rhythm, normal first and second heart sounds, no murmurs, rubs or gallops Abdomen: no tenderness or distention, no masses by palpation, no abnormal pulsatility or arterial bruits, normal bowel sounds, no hepatosplenomegaly Extremities: no clubbing, cyanosis or edema; 2+ radial, ulnar and brachial pulses bilaterally; 2+ right femoral, posterior tibial and dorsalis pedis pulses; 2+ left femoral, posterior tibial and dorsalis pedis pulses; no subclavian or femoral bruits Neurological: grossly nonfocal Skin: Small psoriatic plaques on his elbows  And in the periumbilical area   EKG: Sinus rhythm with frequent PACs, nonspecific ST-T. abnormality most obvious in the mid precordial leads  Lipid Panel  Most recent lipid profile total cholesterol 149, triglycerides 146, HDL 50, LDL 70. I think that since that lipid profile was performed he has switched from Crestor to  Lipitor. I am not sure if a repeat has been done since.  BMET    Component Value Date/Time   NA 139 03/04/2013 1512   K 4.3 03/04/2013 1512   CL 97 03/04/2013 1512   CO2 30 03/04/2013 1512   GLUCOSE 79 03/04/2013 1512   BUN 19 03/04/2013 1512   CREATININE 1.10 03/04/2013 1512   CALCIUM 9.7 03/04/2013 1512     ASSESSMENT AND PLAN  Mr. Langille does not have overt physical findings to suggest congestive heart failure, but his weight has increased back to a level at which he had heart failure that improved following diuretics the last fall. He probably has another episode of mild acute exacerbation of heart failure. On the other hand he also has significant chronic lung disease and essentially untreated obstructive sleep apnea which could be causing similar symptoms. I recommended that he increase compliance with CPAP and have a repeat echocardiogram. If the echo shows evidence of increased left atrial filling pressure, we will adjust his diuretics.   It is probably also time for him to have a repeat lipid profile and we will check his BUN and creatinine in anticipation of increased diuretics.   He does not have clear evidence of an acute coronary insufficiency problem, although he is known to have lost 2 of his 4 bypass grafts shortly after bypass surgery. His blood pressure is in the desirable range.  Orders Placed This Encounter  Procedures  . 2D Echocardiogram without contrast   Meds ordered this encounter  Medications  . ARIPiprazole (ABILIFY) 5 MG tablet    Sig: Take 1 tablet by mouth daily.  . metoprolol succinate (TOPROL-XL) 25 MG 24 hr tablet    Sig: Take 1 tablet by mouth daily.  [name] atorvastatin (LIPITOR) 20 MG tablet    Sig: Take 1 tablet by mouth daily.  [name] tiotropium (SPIRIVA HANDIHALER) 18 MCG inhalation capsule    Sig: Place 1 Inhaler into inhaler and inhale daily.  . Fluticasone-Salmeterol (ADVAIR DISKUS) 250-50 MCG/DOSE AEPB    Sig: Inhale 1 puff into the lungs daily.  [name]  albuterol (PROAIR HFA) 108 (90 BASE) MCG/ACT inhaler    Sig: Inhale 1 puff into the lungs daily.  [name] FLUTICASONE FUROATE IN    Sig: Place 1 puff into the nose 2 (two) times daily.  [name] darifenacin (ENABLEX) 15 MG 24 hr tablet    Sig:  Take 15 mg by mouth daily.    Holli Humbles, MD, [place] [phone] office [phone] pager

## 2014-01-18 NOTE — Telephone Encounter (Signed)
Patient notified of the lab order.  I have placed the order

## 2014-01-18 NOTE — Telephone Encounter (Signed)
Message copied by Chauncy Lean on Mon Jan 18, 2014  4:42 PM ------      Message from: [name]      Created: Mon Jan 18, 2014  2:42 PM       Please call him and tell him he also needs a lipid profile and CMET? He can pick up a lab slip and he comes in for his echo.      Thanks      MCr ------

## 2014-01-18 NOTE — Patient Instructions (Signed)
Your physician has requested that you have an echocardiogram. Echocardiography is a painless test that uses sound waves to create images of your heart. It provides your doctor with information about the size and shape of your heart and how well your heart's chambers and valves are working. This procedure takes approximately one hour. There are no restrictions for this procedure.  Your physician wants you to follow-up in: 1 year with Dr. Croitoru You will receive a reminder letter in the mail two months in advance. If you don't receive a letter, please call our office to schedule the follow-up appointment.  

## 2014-01-21 ENCOUNTER — Inpatient Hospital Stay (HOSPITAL_COMMUNITY): Admission: RE | Admit: 2014-01-21 | Source: Ambulatory Visit

## 2014-01-25 ENCOUNTER — Ambulatory Visit (HOSPITAL_COMMUNITY)
Admission: RE | Admit: 2014-01-25 | Discharge: 2014-01-25 | Disposition: A | Discharge status: Home / Self Care | Source: Ambulatory Visit | Attending: Internal Medicine | Admitting: Internal Medicine

## 2014-01-25 DIAGNOSIS — R0602 Shortness of breath: Secondary | ICD-10-CM | POA: Insufficient documentation

## 2014-01-25 DIAGNOSIS — I255 Ischemic cardiomyopathy: Secondary | ICD-10-CM

## 2014-01-25 DIAGNOSIS — R0609 Other forms of dyspnea: Secondary | ICD-10-CM

## 2014-01-25 DIAGNOSIS — R0989 Other specified symptoms and signs involving the circulatory and respiratory systems: Secondary | ICD-10-CM

## 2014-01-25 DIAGNOSIS — I517 Cardiomegaly: Secondary | ICD-10-CM

## 2014-01-25 DIAGNOSIS — Z8249 Family history of ischemic heart disease and other diseases of the circulatory system: Secondary | ICD-10-CM | POA: Insufficient documentation

## 2014-01-25 DIAGNOSIS — E785 Hyperlipidemia, unspecified: Secondary | ICD-10-CM | POA: Insufficient documentation

## 2014-01-25 DIAGNOSIS — Z87891 Personal history of nicotine dependence: Secondary | ICD-10-CM | POA: Insufficient documentation

## 2014-01-25 LAB — COMPLETE METABOLIC PANEL WITH GFR
ALT: 21 U/L (ref 0–53)
AST: 16 U/L (ref 0–37)
Albumin: 3.7 g/dL (ref 3.5–5.2)
Alkaline Phosphatase: 73 U/L (ref 39–117)
BUN: 18 mg/dL (ref 6–23)
CHLORIDE: 103 meq/L (ref 96–112)
CO2: 28 mEq/L (ref 19–32)
CREATININE: 1.31 mg/dL (ref 0.50–1.35)
Calcium: 8.9 mg/dL (ref 8.4–10.5)
GFR, Est African American: 67 mL/min
GFR, Est Non African American: 58 mL/min — ABNORMAL LOW
GLUCOSE: 102 mg/dL — AB (ref 70–99)
Potassium: 4.6 mEq/L (ref 3.5–5.3)
Sodium: 139 mEq/L (ref 135–145)
Total Bilirubin: 0.3 mg/dL (ref 0.2–1.2)
Total Protein: 6.4 g/dL (ref 6.0–8.3)

## 2014-01-25 LAB — LIPID PANEL
CHOLESTEROL: 108 mg/dL (ref 0–200)
HDL: 34 mg/dL — ABNORMAL LOW (ref >39)
LDL Cholesterol: 32 mg/dL (ref 0–99)
TRIGLYCERIDES: 211 mg/dL — AB (ref <150)
Total CHOL/HDL Ratio: 3.2 Ratio
VLDL: 42 mg/dL — AB (ref 0–40)

## 2014-01-25 NOTE — Progress Notes (Addendum)
2D Echocardiogram Complete.  01/25/2014   Takela Varden, RDCS  This was a technically difficult study due to patients inability to hold breath.

## 2014-02-01 ENCOUNTER — Telehealth: Payer: Self-pay | Admitting: *Deleted

## 2014-02-01 DIAGNOSIS — R6889 Other general symptoms and signs: Secondary | ICD-10-CM

## 2014-02-01 DIAGNOSIS — R0602 Shortness of breath: Secondary | ICD-10-CM

## 2014-02-01 NOTE — Telephone Encounter (Signed)
Patient called and instructed to increase Furosemide to 80mg  daily and K+ 1meq daily, continue checking daily weights and keep a log and have blood work done in 1 week.  F/U visit in 2-3 weeks. Patient voiced understanding.

## 2014-02-01 NOTE — Telephone Encounter (Signed)
Message copied by Tressa Busman on Mon Feb 01, 2014  1:22 PM ------      Message from: [name]      Created: Mon Feb 01, 2014 11:29 AM       Please ask him to:      increase furosemide to 80 mg daily      KDur to 40 mEq daily       continue checking daily weight      BMP and BNP in 1 week      office f/u in 2-3 weeks please ------

## 2014-02-03 ENCOUNTER — Ambulatory Visit: Admitting: Cardiovascular Disease

## 2014-02-11 ENCOUNTER — Encounter: Payer: Self-pay | Admitting: Cardiovascular Disease

## 2014-02-11 ENCOUNTER — Ambulatory Visit (INDEPENDENT_AMBULATORY_CARE_PROVIDER_SITE_OTHER): Admitting: Cardiovascular Disease

## 2014-02-11 VITALS — BP 106/52 | HR 68 | Resp 20 | Ht 69.0 in | Wt 250.3 lb

## 2014-02-11 DIAGNOSIS — I255 Ischemic cardiomyopathy: Secondary | ICD-10-CM | POA: Diagnosis not present

## 2014-02-11 DIAGNOSIS — I5042 Chronic combined systolic (congestive) and diastolic (congestive) heart failure: Secondary | ICD-10-CM

## 2014-02-11 DIAGNOSIS — G4730 Sleep apnea, unspecified: Secondary | ICD-10-CM

## 2014-02-11 DIAGNOSIS — I2510 Atherosclerotic heart disease of native coronary artery without angina pectoris: Secondary | ICD-10-CM | POA: Diagnosis not present

## 2014-02-11 DIAGNOSIS — E785 Hyperlipidemia, unspecified: Secondary | ICD-10-CM

## 2014-02-11 DIAGNOSIS — Z951 Presence of aortocoronary bypass graft: Secondary | ICD-10-CM

## 2014-02-11 DIAGNOSIS — IMO0001 Reserved for inherently not codable concepts without codable children: Secondary | ICD-10-CM

## 2014-02-11 DIAGNOSIS — Z87891 Personal history of nicotine dependence: Secondary | ICD-10-CM

## 2014-02-11 MED ORDER — METOLAZONE 2.5 MG PO TABS: ORAL_TABLET | ORAL | Status: DC

## 2014-02-11 NOTE — Progress Notes (Signed)
Patient ID: Jay [name], male   DOB: 1950-08-22, 63 y.o.   MRN: [id number]      Reason for office visit Shortness of breath, ischemic cardiomyopathy  Despite increasing his dose of loop diuretic, Jay [name] has not had any improvement in his dyspnea or any reduction in his weight. He still weighs 250 pounds on office scale (2 and 48 pounds on his home scale), roughly 18 pounds more than he did throughout 2013-2014. In November 2014 dyspnea improved when he was diuresed from a weight of 250 pounds down to 245 pounds. He does not have lower extremity edema. He describes functional class III exertional dyspnea (he becomes winded taking a shower).  His recent echocardiogram was interpreted as showing a drop in his left ventricular ejection fraction down to 35%, but on my review there is no visible change compared to his previous echo when the EF was interpreted as as 40-45%. He had a nuclear stress test in January 2014 showing an ejection fraction of approximately 40% with an extensive lateral and inferior wall scar with mild peri-infarct ischemia.  He has a history of coronary artery disease and underwent multivessel bypass surgery in 2007 (LIMA to LAD, SVG to PDA, SVG to OM, SVG to ramus intermedius) and underwent repeat cardiac catheterization in 2008: He had interim total occlusion of the vein graft to the OM and ramus intermedius, patent but mildly diseased SVG to PDA and a patent LIMA to the LAD. At that time his left ventricular ejection fraction was 40-45% and he had evidence of elevated left ventricular end-diastolic pressure. He has obstructive sleep apnea but is poorly compliant with CPAP (on his estimation roughly 20% of the time). He has well treated hypercholesterolemia with mild residual hypertriglyceridemia and a low HDL.   Allergies  Allergen Reactions  . Lipitor [Atorvastatin]     Myalgia    Current Outpatient Prescriptions  Medication Sig Dispense Refill  . albuterol  (PROAIR HFA) 108 (90 BASE) MCG/ACT inhaler Inhale 1 puff into the lungs daily.      . ARIPiprazole (ABILIFY) 5 MG tablet Take 1 tablet by mouth daily.      [name] atorvastatin (LIPITOR) 20 MG tablet Take 1 tablet by mouth daily.      . candesartan (ATACAND) 8 MG tablet Take 8 mg by mouth daily.      . clopidogrel (PLAVIX) 75 MG tablet Take 75 mg by mouth daily.      [name] darifenacin (ENABLEX) 15 MG 24 hr tablet Take 15 mg by mouth daily.      [name] escitalopram (LEXAPRO) 10 MG tablet Take 10 mg by mouth daily.      [name] esomeprazole (NEXIUM) 40 MG capsule Take 40 mg by mouth daily before breakfast.      . etanercept (ENBREL) 50 MG/ML injection Inject 50 mg into the skin once a week.      [name] FLUTICASONE FUROATE IN Place 1 puff into the nose 2 (two) times daily.      . Fluticasone-Salmeterol (ADVAIR DISKUS) 250-50 MCG/DOSE AEPB Inhale 1 puff into the lungs daily.      . furosemide (LASIX) 20 MG tablet Take 2 tablets (40 mg total) by mouth daily.  180 tablet  1  . metoprolol succinate (TOPROL-XL) 25 MG 24 hr tablet Take 1 tablet by mouth daily.      . mirabegron ER (MYRBETRIQ) 50 MG TB24 tablet Take 50 mg by mouth daily. Taking two po daily      . potassium  chloride (K-DUR) 10 MEQ tablet Take 2 tablets (20 mEq total) by mouth 2 (two) times daily.  360 tablet  1  . tiotropium (SPIRIVA HANDIHALER) 18 MCG inhalation capsule Place 1 Inhaler into inhaler and inhale daily.      . metolazone (ZAROXOLYN) 2.5 MG tablet TAKE ONE TABLET 30 MINUTES PRIOR TO FUROSEMIDE IF YOUR WEIGHT IS GREATER THAN 240 LBS.  30 tablet  5   No current facility-administered medications for this visit.    Past Medical History  Diagnosis Date  . S/P CABG x 4 2007    Dr.  [name]  . Coronary artery disease   . Obstructive sleep apnea on CPAP   . Hyperlipidemia   . Psoriasis   . Arthritis   . Ischemic cardiomyopathy     Ejection fraction 40-45%  . Tobacco abuse   . Obesity, Class II, BMI 35-39.9     Past Surgical History    Procedure Laterality Date  . Coronary artery bypass graft  02/14/2006    LIMA to LAD,SVG to posterior descending,SVG to CX,SVG to ramus intermediate.  . Cardiac catheterization  07/04/2006    significant 3 vessel disease: totally vein graft to the ramus intermedius,toalled vein graft to the obtuse marginal, mildly diseased vein graft of the posterior descending artery, mildly depressed LV systolic fx.    Family History  Problem Relation Age of Onset  . Heart attack Father     History   Social History  . Marital Status: Married    Spouse Name: N/A    Number of Children: N/A  . Years of Education: N/A   Occupational History  . Not on file.   Social History Main Topics  . Smoking status: Former Research scientist (life sciences)  . Smokeless tobacco: Not on file  . Alcohol Use: No  . Drug Use: No  . Sexual Activity: Not on file   Other Topics Concern  . Not on file   Social History Narrative  . No narrative on file    Review of systems: The patient specifically denies any chest pain at rest or with exertion, dyspnea at rest, orthopnea, paroxysmal nocturnal dyspnea, syncope, palpitations, focal neurological deficits, intermittent claudication, lower extremity edema, unexplained weight gain, cough, hemoptysis or wheezing.  The patient also denies abdominal pain, nausea, vomiting, dysphagia, diarrhea, constipation, polyuria, polydipsia, dysuria, hematuria, frequency, urgency, abnormal bleeding or bruising, fever, chills, unexpected weight changes, mood swings, change in skin or hair texture, change in voice quality, auditory or visual problems, allergic reactions or rashes, new musculoskeletal complaints other than usual "aches and pains".   PHYSICAL EXAM BP 106/52  Pulse 68  Ht 5\' 9"  (1.753 m)  Wt 113.535 kg (250 lb 4.8 oz)  BMI 36.95 kg/m2 General: Alert, oriented x3, no distress  Head: no evidence of trauma, PERRL, EOMI, no exophtalmos or lid lag, no myxedema, no xanthelasma; normal ears, nose and  oropharynx  Neck: normal jugular venous pulsations and no hepatojugular reflux; brisk carotid pulses without delay and no carotid bruits  Chest: clear to auscultation, no signs of consolidation by percussion or palpation, normal fremitus, symmetrical and full respiratory excursions. Healed sternotomy scar  Cardiovascular: normal position and quality of the apical impulse, regular rhythm, normal first and second heart sounds, no murmurs, rubs or gallops  Abdomen: no tenderness or distention, no masses by palpation, no abnormal pulsatility or arterial bruits, normal bowel sounds, no hepatosplenomegaly  Extremities: no clubbing, cyanosis or edema; 2+ radial, ulnar and brachial pulses bilaterally; 2+ right femoral, posterior  tibial and dorsalis pedis pulses; 2+ left femoral, posterior tibial and dorsalis pedis pulses; no subclavian or femoral bruits  Neurological: grossly nonfocal  Skin: Small psoriatic plaques on his elbows and in the periumbilical area   EKG: Sinus rhythm with frequent PACs, nonspecific ST-T. abnormality most obvious in the mid precordial leads   Lipid Panel     Component Value Date/Time   CHOL 108 01/25/2014 0829   TRIG 211* 01/25/2014 0829   HDL 34* 01/25/2014 0829   CHOLHDL 3.2 01/25/2014 0829   VLDL 42* 01/25/2014 0829   LDLCALC 32 01/25/2014 0829    BMET    Component Value Date/Time   NA 139 01/25/2014 0829   K 4.6 01/25/2014 0829   CL 103 01/25/2014 0829   CO2 28 01/25/2014 0829   GLUCOSE 102* 01/25/2014 0829   BUN 18 01/25/2014 0829   CREATININE 1.31 01/25/2014 0829   CALCIUM 8.9 01/25/2014 0829   GFRNONAA 58* 01/25/2014 0829   GFRAA 67 01/25/2014 0829     ASSESSMENT AND PLAN  Known next diuresis and no improvement in symptoms of dyspnea since we increased his dose of loop diuretic. Will add metolazone before his furosemide. Target a "dry weight" of 240 pounds. He will only take metolazone on days when his weight is greater than 240.  If diuresis is poorly tolerated  with hypotension and renal insufficiency, or if his symptoms are not improved, he should undergo right and left heart catheterization. While he does not have angina pectoris, he has lost 2 of his previous 4 bypass grafts already. He may have new coronary obstruction that is causing increased heart failure symptoms. On the other hand he has essentially untreated obstructive sleep apnea and this may be an alternative explanation for his complaints.  His LDL cholesterol is excellent. His triglycerides and HDL levels reflect a pattern of insulin resistance, confirmed by his borderline elevated glucose. They're unlikely to improve without substantial weight loss.  Blood pressure control is excellent.  Meds ordered this encounter  Medications  . metolazone (ZAROXOLYN) 2.5 MG tablet    Sig: TAKE ONE TABLET 30 MINUTES PRIOR TO FUROSEMIDE IF YOUR WEIGHT IS GREATER THAN 240 LBS.    Dispense:  30 tablet    Refill:  [place] Kenzi Bardwell, MD, [facility] [phone] office [phone] pager

## 2014-02-11 NOTE — Patient Instructions (Signed)
TAKE ZAROXOLYN 2.5MG  30 MINUTES PRIOR TO FUROSEMIDE IF YOUR HOME WEIGHT IS GREATER THAN 240 LBS.  A PRESCRIPTION HAS BEEN SENT TO [facility].  Your physician recommends that you schedule a follow-up appointment in: 3-4 WEEKS WITH AN EXTENDER.  Dr. Sallyanne Kuster recommends that you schedule a follow-up appointment in: 3 MONTHS.

## 2014-02-24 ENCOUNTER — Other Ambulatory Visit (HOSPITAL_COMMUNITY): Payer: Self-pay | Admitting: Cardiovascular Disease

## 2014-02-24 DIAGNOSIS — I25709 Atherosclerosis of coronary artery bypass graft(s), unspecified, with unspecified angina pectoris: Secondary | ICD-10-CM

## 2014-02-26 ENCOUNTER — Encounter (HOSPITAL_COMMUNITY): Payer: Self-pay | Admitting: *Deleted

## 2014-02-26 ENCOUNTER — Ambulatory Visit: Admitting: Cardiovascular Disease

## 2014-03-10 ENCOUNTER — Inpatient Hospital Stay (HOSPITAL_COMMUNITY): Admission: RE | Admit: 2014-03-10 | Source: Ambulatory Visit

## 2014-03-11 ENCOUNTER — Ambulatory Visit: Admitting: Physician Assistant

## 2014-03-23 ENCOUNTER — Ambulatory Visit (INDEPENDENT_AMBULATORY_CARE_PROVIDER_SITE_OTHER): Admitting: Physician Assistant

## 2014-03-23 ENCOUNTER — Encounter: Payer: Self-pay | Admitting: Physician Assistant

## 2014-03-23 VITALS — BP 110/58 | HR 62 | Ht 69.0 in | Wt 248.8 lb

## 2014-03-23 DIAGNOSIS — G4730 Sleep apnea, unspecified: Secondary | ICD-10-CM

## 2014-03-23 DIAGNOSIS — E785 Hyperlipidemia, unspecified: Secondary | ICD-10-CM

## 2014-03-23 DIAGNOSIS — I5042 Chronic combined systolic (congestive) and diastolic (congestive) heart failure: Secondary | ICD-10-CM

## 2014-03-23 DIAGNOSIS — Z951 Presence of aortocoronary bypass graft: Secondary | ICD-10-CM

## 2014-03-23 DIAGNOSIS — I25709 Atherosclerosis of coronary artery bypass graft(s), unspecified, with unspecified angina pectoris: Secondary | ICD-10-CM

## 2014-03-23 LAB — BASIC METABOLIC PANEL WITH GFR
BUN: 14 mg/dL (ref 6–23)
CHLORIDE: 101 meq/L (ref 96–112)
CO2: 30 mEq/L (ref 19–32)
CREATININE: 1.21 mg/dL (ref 0.50–1.35)
Calcium: 8.3 mg/dL — ABNORMAL LOW (ref 8.4–10.5)
GFR, EST NON AFRICAN AMERICAN: 64 mL/min
GFR, Est African American: 74 mL/min
Glucose, Bld: 81 mg/dL (ref 70–99)
POTASSIUM: 4.2 meq/L (ref 3.5–5.3)
Sodium: 138 mEq/L (ref 135–145)

## 2014-03-23 NOTE — Patient Instructions (Signed)
Your physician recommends that you schedule a follow-up appointment in: [place]  Your physician recommends that you HAVE [facility]

## 2014-03-23 NOTE — Assessment & Plan Note (Signed)
No complaints of angina. 

## 2014-03-23 NOTE — Assessment & Plan Note (Signed)
Patient is now on metolazone 2.5 mg she is taking every day along with 80 mg of Lasix. He appears euvolemic. No reports of dizziness. This echocardiogram shows a further reduction in his ejection fraction 35%. Continue current dosing of diuretics. I am going to check a basic metabolic panel to make sure his kidney function is stable. Will make further adjustments as needed based on the labs.

## 2014-03-23 NOTE — Progress Notes (Signed)
Patient ID: Jay [name], male   DOB: [birth year], 63 y.o.   MRN: [id number]     Date:  03/23/2014   ID:  Jay [name], DOB [birth year], MRN [id number]  PCP:  Raeanne Gathers, MD  Primary Cardiologist:  Croitoru    History of Present Illness: Jay [name] is a 63 y.o. male with a history of coronary artery disease and underwent multivessel bypass surgery in 2007 (LIMA to LAD, SVG to PDA, SVG to OM, SVG to ramus intermedius) and underwent repeat cardiac catheterization in 2008: He had interim total occlusion of the vein graft to the OM and ramus intermedius, patent but mildly diseased SVG to PDA and a patent LIMA to the LAD. At that time his left ventricular ejection fraction was 40-45% and he had evidence of elevated left ventricular end-diastolic pressure. In 2014 he underwent a nuclear stress test that showed a large area of inferolateral scar without meaningful ischemia and an ejection fraction of 40%. His echocardiogram in April of 2014 showed a similar ejection fraction of 40-45% with a mitral inflow pattern suggestive of mild diastolic dysfunction. He did not have meaningful valvular abnormalities. His right ventricle was described as moderately dilated. The PA pressure was not calculated.  In November of 2014 he had acute on chronic heart failure exacerbation which was treated as an outpatient with brief increase in diuretics. He seemed to have worsening heart. Weight of 250 pounds, better when his weight decreased to 245 pounds. Today he is back up to 250 pounds.  He also has obstructive sleep apnea and reports only roughly 10-15% compliance with CPAP therapy. He is moderately obese with a BMI of about 37. He is on chronic treatment with Enbrel for psoriasis and psoriatic arthritis. He has compensated systemic hypertension and takes a statin for hyperlipidemia but does not have diabetes mellitus. He is on chronic low-dose loop diuretic therapy. He has a history of previous tobacco  abuse and COPD but quit smoking more than 10 years ago. He had prostate cancer and urinary bladder cancer and underwent a robotic prostatectomy in 2008.  Patient was seen by Dr.Croitoru on 01/18/2014 at that time he recommended increase compliance with his CPAP. He had a 2-D echocardiogram which revealed an ejection fraction of 35% with trivial aortic regurgitation. The left atrium was moderately dilated. Right ventricle was mildly dilated and systolic function was moderately reduced. The right atrium was moderately dilated.   Previous echo in April 2014 reported as ejection fraction of 40-45%.  Lipid panel results are below.  His furosemide was increased to 80 mg daily and increase in potassium.  He is taking metolazone 2.5 mg daily prior to Lasix. States that his weight is fluctuating between about 244-50. He has some lower extremity edema improves by morning. He is trying to be more compliant with his CPAP. Shortness of breath is about at baseline.  He currently denies nausea, vomiting, fever, chest pain, orthopnea, dizziness, PND, cough, congestion, abdominal pain, hematochezia, melena, claudication.  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   Lipid Panel     Component Value Date/Time   CHOL 108 01/25/2014 0829   TRIG 211* 01/25/2014 0829   HDL 34* 01/25/2014 0829   CHOLHDL 3.2 01/25/2014 0829   VLDL 42* 01/25/2014 0829   LDLCALC 32 01/25/2014 0829     Wt Readings from Last 3 Encounters:  03/23/14 248 lb 12.8 oz (112.855 kg)  02/11/14 250 lb 4.8 oz (113.535 kg)  01/18/14 250 lb 3.2 oz (113.49 kg)     Past Medical History  Diagnosis Date  . S/P CABG x 4 2007    Dr.  [name]  . Coronary artery disease   . Obstructive sleep apnea on CPAP   . Hyperlipidemia   . Psoriasis   . Arthritis   . Ischemic cardiomyopathy     Ejection fraction 40-45%  . Tobacco abuse   . Obesity, Class II, BMI 35-39.9     Current  Outpatient Prescriptions  Medication Sig Dispense Refill  . albuterol (PROAIR HFA) 108 (90 BASE) MCG/ACT inhaler Inhale 1 puff into the lungs daily.    . ARIPiprazole (ABILIFY) 5 MG tablet Take 1 tablet by mouth daily.    [name] atorvastatin (LIPITOR) 20 MG tablet Take 1 tablet by mouth daily.    . candesartan (ATACAND) 8 MG tablet Take 8 mg by mouth daily.    . clopidogrel (PLAVIX) 75 MG tablet Take 75 mg by mouth daily.    [name] darifenacin (ENABLEX) 15 MG 24 hr tablet Take 15 mg by mouth daily.    [name] escitalopram (LEXAPRO) 10 MG tablet Take 10 mg by mouth daily.    [name] esomeprazole (NEXIUM) 40 MG capsule Take 40 mg by mouth daily before breakfast.    . etanercept (ENBREL) 50 MG/ML injection Inject 50 mg into the skin once a week.    [name] FLUTICASONE FUROATE IN Place 1 puff into the nose 2 (two) times daily.    . Fluticasone-Salmeterol (ADVAIR DISKUS) 250-50 MCG/DOSE AEPB Inhale 1 puff into the lungs daily.    . furosemide (LASIX) 20 MG tablet Take 2 tablets (40 mg total) by mouth daily. 180 tablet 1  . meloxicam (MOBIC) 15 MG tablet Take 15 mg by mouth.    . metolazone (ZAROXOLYN) 2.5 MG tablet TAKE ONE TABLET 30 MINUTES PRIOR TO FUROSEMIDE IF YOUR WEIGHT IS GREATER THAN 240 LBS. 30 tablet 5  . metoprolol succinate (TOPROL-XL) 25 MG 24 hr tablet Take 1 tablet by mouth daily.    [name] nystatin cream (MYCOSTATIN) Apply 1 application topically daily.  0  . Potassium 99 MG TABS Take 1 tablet by mouth daily.    . potassium chloride (K-DUR) 10 MEQ tablet Take 2 tablets (20 mEq total) by mouth 2 (two) times daily. 360 tablet 1  . sildenafil (VIAGRA) 100 MG tablet Take 100 mg by mouth as needed.    . tiotropium (SPIRIVA HANDIHALER) 18 MCG inhalation capsule Place 1 Inhaler into inhaler and inhale daily.    . Urea 50 % CREA Apply 1 application topically 3 (three) times daily.  0  . mirabegron ER (MYRBETRIQ) 50 MG TB24 tablet Take 50 mg by mouth daily. Taking two po daily    . MYRBETRIQ 25 MG TB24 tablet Take 25 mg by  mouth daily.  0   No current facility-administered medications for this visit.    Allergies:    Allergies  Allergen Reactions  . Lipitor [Atorvastatin]     Myalgia    Social History:  The patient  reports that he has quit smoking. He does not have any smokeless tobacco history on file. He reports that he does not drink alcohol or use illicit drugs.   Family history:   Family History  Problem Relation Age of Onset  . Heart attack Father     ROS:  Please see the history of present illness.  All other systems reviewed and negative.  PHYSICAL EXAM: VS:  BP 110/58 mmHg  Pulse 62  Ht 5\' 9"  (1.753 m)  Wt 248 lb 12.8 oz (112.855 kg)  BMI 36.72 kg/m2 Obese, well developed, in no acute distress HEENT: Pupils are equal round react to light accommodation extraocular movements are intact.  Neck: no JVD.  + hepatojugular reflux. Cardiac: Regular rate and rhythm without murmurs rubs or gallops. Lungs:  clear to auscultation bilaterally, no wheezing, rhonchi or rales Abd: soft, nontender, positive bowel sounds all quadrants,  Ext: Trace lower extremity edema.  2+ radial and dorsalis pedis pulses. Skin: warm and dry Neuro:  Grossly normal   ASSESSMENT AND PLAN:  Problem List Items Addressed This Visit    Chronic combined systolic and diastolic heart failure, NYHA class 2    Patient is now on metolazone 2.5 mg she is taking every day along with 80 mg of Lasix. He appears euvolemic. No reports of dizziness. This echocardiogram shows a further reduction in his ejection fraction 35%. Continue current dosing of diuretics. I am going to check a basic metabolic panel to make sure his kidney function is stable. Will make further adjustments as needed based on the labs.    Relevant Medications      sildenafil (VIAGRA) 100 MG tablet   Coronary artery disease - Primary    No complaints of angina    Relevant Medications      sildenafil (VIAGRA) 100 MG tablet   Other Relevant Orders      BMP  with Estimated GFR ([id number])   Hyperlipidemia    Lipid Panel     Component Value Date/Time   CHOL 108 01/25/2014 0829   TRIG 211* 01/25/2014 0829   HDL 34* 01/25/2014 0829   CHOLHDL 3.2 01/25/2014 0829   VLDL 42* 01/25/2014 0829   LDLCALC 32 01/25/2014 0829    We discussed cutting back on carbohydrate intake and eating meats higher in omega-3 fatty acids.      Relevant Medications      sildenafil (VIAGRA) 100 MG tablet   S/P CABG x 4, 2007   Sleep apnea    He is trying to be more compliant with his CPAP.

## 2014-03-23 NOTE — Assessment & Plan Note (Signed)
He is trying to be more compliant with his CPAP.

## 2014-03-23 NOTE — Assessment & Plan Note (Signed)
Lipid Panel     Component Value Date/Time   CHOL 108 01/25/2014 0829   TRIG 211* 01/25/2014 0829   HDL 34* 01/25/2014 0829   CHOLHDL 3.2 01/25/2014 0829   VLDL 42* 01/25/2014 0829   LDLCALC 32 01/25/2014 0829    We discussed cutting back on carbohydrate intake and eating meats higher in omega-3 fatty acids.

## 2014-04-05 ENCOUNTER — Other Ambulatory Visit: Payer: Self-pay | Admitting: Internal Medicine

## 2014-04-05 NOTE — Telephone Encounter (Signed)
Rx has been sent to the pharmacy electronically. ° °

## 2014-06-21 ENCOUNTER — Ambulatory Visit (INDEPENDENT_AMBULATORY_CARE_PROVIDER_SITE_OTHER): Admitting: Cardiovascular Disease

## 2014-06-21 ENCOUNTER — Encounter: Payer: Self-pay | Admitting: Cardiovascular Disease

## 2014-06-21 VITALS — BP 140/66 | HR 66 | Resp 22 | Ht 69.0 in | Wt 248.3 lb

## 2014-06-21 DIAGNOSIS — IMO0001 Reserved for inherently not codable concepts without codable children: Secondary | ICD-10-CM

## 2014-06-21 DIAGNOSIS — I25709 Atherosclerosis of coronary artery bypass graft(s), unspecified, with unspecified angina pectoris: Secondary | ICD-10-CM

## 2014-06-21 DIAGNOSIS — I5042 Chronic combined systolic (congestive) and diastolic (congestive) heart failure: Secondary | ICD-10-CM

## 2014-06-21 DIAGNOSIS — G4730 Sleep apnea, unspecified: Secondary | ICD-10-CM

## 2014-06-21 DIAGNOSIS — E785 Hyperlipidemia, unspecified: Secondary | ICD-10-CM

## 2014-06-21 DIAGNOSIS — Z951 Presence of aortocoronary bypass graft: Secondary | ICD-10-CM

## 2014-06-21 DIAGNOSIS — J449 Chronic obstructive pulmonary disease, unspecified: Secondary | ICD-10-CM | POA: Insufficient documentation

## 2014-06-21 DIAGNOSIS — J439 Emphysema, unspecified: Secondary | ICD-10-CM

## 2014-06-21 DIAGNOSIS — I255 Ischemic cardiomyopathy: Secondary | ICD-10-CM

## 2014-06-21 NOTE — Patient Instructions (Signed)
Dr. Sallyanne Kuster recommends that you schedule a follow-up appointment in: 6 months  Your physician recommends that you weigh, daily, at the same time every day, and in the same amount of clothing. Please record your daily weights on the handout provided and bring it to your next appointment.

## 2014-06-21 NOTE — Progress Notes (Signed)
Patient ID: Jay [name], male   DOB: 1950-05-16, 64 y.o.   MRN: [id number]     Reason for office visit Chronic systolic and diastolic heart failure, ischemic cardiomyopathy, CADs/p CABG, OSA, COPD  Since his last appointment, Jay [name] has not had any cardiac problems. He has not required any doses of metolazone for heart failure. He did have an episode of COPD exacerbation that required steroids and antibiotics, but this has now resolved. He continues to have NYHA functional class III exertional dyspnea. He denies angina pectoris and has not had lower extremity edema. He has become more lax with his daily weights and has not weighed himself in a few days. Our office scale shows exactly the same weight from last November. He is using CPAP more frequently, but his compliance remains mediocre.  He has a history of coronary artery disease and underwent multivessel bypass surgery in 2007 (LIMA to LAD, SVG to PDA, SVG to OM, SVG to ramus intermedius) and underwent repeat cardiac catheterization in 2008: He had interim total occlusion of the vein graft to the OM and ramus intermedius, patent but mildly diseased SVG to PDA and a patent LIMA to the LAD. At that time his left ventricular ejection fraction was 40-45% and he had evidence of elevated left ventricular end-diastolic pressure. Most recent echo shows an ejection fraction of around 40% in my review. He has obstructive sleep apnea but is poorly compliant with CPAP. He has well treated hypercholesterolemia with mild residual hypertriglyceridemia and a low HDL. He has psoriasis and psoriatic arthritis on treatment with Enbrel.  Allergies  Allergen Reactions  . Lipitor [Atorvastatin]     Myalgia    Current Outpatient Prescriptions  Medication Sig Dispense Refill  . Fluticasone-Salmeterol (ADVAIR) 500-50 MCG/DOSE AEPB Inhale 2 puffs into the lungs 2 (two) times daily.    [name] albuterol (PROAIR HFA) 108 (90 BASE) MCG/ACT inhaler Inhale 1 puff into  the lungs daily.    . ARIPiprazole (ABILIFY) 5 MG tablet Take 1 tablet by mouth daily.    [name] atorvastatin (LIPITOR) 20 MG tablet Take 1 tablet by mouth daily.    . candesartan (ATACAND) 8 MG tablet Take 8 mg by mouth daily.    . clopidogrel (PLAVIX) 75 MG tablet Take 75 mg by mouth daily.    [name] darifenacin (ENABLEX) 15 MG 24 hr tablet Take 15 mg by mouth daily.    [name] escitalopram (LEXAPRO) 10 MG tablet Take 10 mg by mouth daily.    [name] esomeprazole (NEXIUM) 40 MG capsule Take 40 mg by mouth daily before breakfast.    . etanercept (ENBREL) 50 MG/ML injection Inject 50 mg into the skin once a week.    [name] FLUTICASONE FUROATE IN Place 1 puff into the nose 2 (two) times daily.    . furosemide (LASIX) 20 MG tablet TAKE 2 TABLETS DAILY 180 tablet 1  . KLOR-CON M10 10 MEQ tablet TAKE 2 TABLETS TWICE A DAY 360 tablet 1  . meloxicam (MOBIC) 15 MG tablet Take 15 mg by mouth.    . metolazone (ZAROXOLYN) 2.5 MG tablet TAKE ONE TABLET 30 MINUTES PRIOR TO FUROSEMIDE IF YOUR WEIGHT IS GREATER THAN 240 LBS. 30 tablet 5  . metoprolol succinate (TOPROL-XL) 25 MG 24 hr tablet Take 1 tablet by mouth daily.    . mirabegron ER (MYRBETRIQ) 50 MG TB24 tablet Take 50 mg by mouth daily. Taking two po daily    . MYRBETRIQ 25 MG TB24 tablet Take 25 mg by mouth  daily.  0  . nystatin cream (MYCOSTATIN) Apply 1 application topically daily.  0  . Potassium 99 MG TABS Take 1 tablet by mouth daily.    . sildenafil (VIAGRA) 100 MG tablet Take 100 mg by mouth as needed.    . tiotropium (SPIRIVA HANDIHALER) 18 MCG inhalation capsule Place 1 Inhaler into inhaler and inhale daily.    . Urea 50 % CREA Apply 1 application topically 3 (three) times daily.  0   No current facility-administered medications for this visit.    Past Medical History  Diagnosis Date  . S/P CABG x 4 2007    Dr.  [name]  . Coronary artery disease   . Obstructive sleep apnea on CPAP   . Hyperlipidemia   . Psoriasis   . Arthritis   . Ischemic  cardiomyopathy     Ejection fraction 40-45%  . Tobacco abuse   . Obesity, Class II, BMI 35-39.9     Past Surgical History  Procedure Laterality Date  . Coronary artery bypass graft  02/14/2006    LIMA to LAD,SVG to posterior descending,SVG to CX,SVG to ramus intermediate.  . Cardiac catheterization  07/04/2006    significant 3 vessel disease: totally vein graft to the ramus intermedius,toalled vein graft to the obtuse marginal, mildly diseased vein graft of the posterior descending artery, mildly depressed LV systolic fx.    Family History  Problem Relation Age of Onset  . Heart attack Father     History   Social History  . Marital Status: Married    Spouse Name: N/A  . Number of Children: N/A  . Years of Education: N/A   Occupational History  . Not on file.   Social History Main Topics  . Smoking status: Former Research scientist (life sciences)  . Smokeless tobacco: Not on file  . Alcohol Use: No  . Drug Use: No  . Sexual Activity: Not on file   Other Topics Concern  . Not on file   Social History Narrative    Review of systems: The patient specifically denies any chest pain at rest or with exertion, dyspnea at rest, orthopnea, paroxysmal nocturnal dyspnea, syncope, palpitations, focal neurological deficits, intermittent claudication, lower extremity edema, unexplained weight gain, cough, hemoptysis or wheezing.  The patient also denies abdominal pain, nausea, vomiting, dysphagia, diarrhea, constipation, polyuria, polydipsia, dysuria, hematuria, frequency, urgency, abnormal bleeding or bruising, fever, chills, unexpected weight changes, mood swings, change in skin or hair texture, change in voice quality, auditory or visual problems, allergic reactions or rashes, new musculoskeletal complaints other than usual "aches and pains".   PHYSICAL EXAM BP 140/66 mmHg  Pulse 66  Resp 22  Ht 5\' 9"  (1.753 m)  Wt 112.628 kg (248 lb 4.8 oz)  BMI 36.65 kg/m2 General: Alert, oriented x3, no distress   Head: no evidence of trauma, PERRL, EOMI, no exophtalmos or lid lag, no myxedema, no xanthelasma; normal ears, nose and oropharynx  Neck: normal jugular venous pulsations and no hepatojugular reflux; brisk carotid pulses without delay and no carotid bruits  Chest: Reduced breath sounds throughout and very few end expiratory wheezes, no signs of consolidation by percussion or palpation, normal fremitus, symmetrical and full respiratory excursions. Healed sternotomy scar  Cardiovascular: normal position and quality of the apical impulse, regular rhythm, normal first and second heart sounds, no murmurs, rubs or gallops  Abdomen: no tenderness or distention, no masses by palpation, no abnormal pulsatility or arterial bruits, normal bowel sounds, no hepatosplenomegaly  Extremities: no clubbing, cyanosis or  edema; 2+ radial, ulnar and brachial pulses bilaterally; 2+ right femoral, posterior tibial and dorsalis pedis pulses; 2+ left femoral, posterior tibial and dorsalis pedis pulses; no subclavian or femoral bruits  Neurological: grossly nonfocal  Skin: Small psoriatic plaques on his elbows and in the periumbilical area   EKG: Atrial fibrillation   Lipid Panel     Component Value Date/Time   CHOL 108 01/25/2014 0829   TRIG 211* 01/25/2014 0829   HDL 34* 01/25/2014 0829   CHOLHDL 3.2 01/25/2014 0829   VLDL 42* 01/25/2014 0829   LDLCALC 32 01/25/2014 0829    BMET    Component Value Date/Time   NA 138 03/23/2014 1104   K 4.2 03/23/2014 1104   CL 101 03/23/2014 1104   CO2 30 03/23/2014 1104   GLUCOSE 81 03/23/2014 1104   BUN 14 03/23/2014 1104   CREATININE 1.21 03/23/2014 1104   CALCIUM 8.3* 03/23/2014 1104   GFRNONAA 64 03/23/2014 1104   GFRAA 74 03/23/2014 1104     ASSESSMENT AND PLAN  Chronic combined systolic and diastolic heart failure  As far as I can tell, he is euvolemic. On chronic loop diuretic. Has not required metolazone and at least a couple of months. Reminded  him about the importance of daily weight monitoring and sodium restriction. Especially cautious with sodium intake when receiving steroids or taking nonsteroidal anti-inflammatory drugs. Reviewed ways to distinguish heart failure from COPD exacerbation, which often is a very difficult challenge. Moderate ischemic cardiomyopathy  EF approximate 40%. On angiotensin receptor blocker and beta blocker in maximum tolerated doses.  CAD s/p CABG with extensive native and graft disease  No angina. Most recent nuclear stress test showed a large inferolateral scar but no reversible ischemia. COPD with recent exacerbation  No longer smokes. Obstructive sleep apnea  Discussed importance of compliance with CPAP to avoid progression of cardiac and pulmonary disease. Obesity  Weight loss would be highly beneficial. Hypertension  Well-controlled Hyperlipidemia  Excellent recent lipid profile as far as the LDL is concerned. Triglycerides and HDL consistent with metabolic syndrome Psoriasis  Consider an alternative biological agent, seems to be hemodynamically stable, need to keep in mind the fact that Enbrel can worsen heart failure   Meds ordered this encounter  Medications  . Fluticasone-Salmeterol (ADVAIR) 500-50 MCG/DOSE AEPB    Sig: Inhale 2 puffs into the lungs 2 (two) times daily.    Holli Humbles, MD, [place] [phone] office [phone] pager

## 2014-08-26 ENCOUNTER — Ambulatory Visit (INDEPENDENT_AMBULATORY_CARE_PROVIDER_SITE_OTHER): Payer: Medicare Other | Admitting: Cardiology

## 2014-08-26 ENCOUNTER — Encounter: Payer: Self-pay | Admitting: Cardiology

## 2014-08-26 VITALS — BP 130/70 | HR 79 | Ht 69.0 in | Wt 248.5 lb

## 2014-08-26 DIAGNOSIS — I255 Ischemic cardiomyopathy: Secondary | ICD-10-CM

## 2014-08-26 DIAGNOSIS — R55 Syncope and collapse: Secondary | ICD-10-CM | POA: Diagnosis not present

## 2014-08-26 DIAGNOSIS — R0602 Shortness of breath: Secondary | ICD-10-CM

## 2014-08-26 DIAGNOSIS — R0789 Other chest pain: Secondary | ICD-10-CM | POA: Diagnosis not present

## 2014-08-26 DIAGNOSIS — R002 Palpitations: Secondary | ICD-10-CM

## 2014-08-26 NOTE — Progress Notes (Signed)
Cardiology Office Note   Date:  08/26/2014   ID:  Jay [name], DOB May 06, 1950, MRN [id number]  PCP:  [name], MD  Cardiologist:  Dr. Bertrum Sol     Chief Complaint  Patient presents with  . Shortness of Breath    patient complains of dizziness and SOB      History of Present Illness: Jay [name] is a 64 y.o. male who presents for Shortness of breath and near syncope.  He has had several- 5 episodes in last month of feeling he may pass out.  These occur more with standing or bending over and getting up.  No associated nausea or diaphoresis and no chest pain.  The only thing he can relate this to is when he believes he had an MI and with that he passed out.  He has no palpitations but does have PVCs on EKG.  He has oxygen at home that he should wear but does not- he does not like to wear.  Today with ambulation his sp02 dropped to 80.     He has a history of coronary artery disease and underwent multivessel bypass surgery in 2007 (LIMA to LAD, SVG to PDA, SVG to OM, SVG to ramus intermedius) and underwent repeat cardiac catheterization in 2008: He had interim total occlusion of the vein graft to the OM and ramus intermedius, patent but mildly diseased SVG to PDA and a patent LIMA to the LAD. At that time his left ventricular ejection fraction was 40-45% and he had evidence of elevated left ventricular end-diastolic pressure. Most recent echo shows an ejection fraction of around 40% in my review. He has obstructive sleep apnea but is poorly compliant with CPAP. He has well treated hypercholesterolemia with mild residual hypertriglyceridemia and a low HDL. He has psoriasis and psoriatic arthritis..    Past Medical History  Diagnosis Date  . S/P CABG x 4 2007    Dr.  [name]  . Coronary artery disease   . Obstructive sleep apnea on CPAP   . Hyperlipidemia   . Psoriasis   . Arthritis   . Ischemic cardiomyopathy     Ejection fraction 40-45%  . Tobacco abuse    . Obesity, Class II, BMI 35-39.9     Past Surgical History  Procedure Laterality Date  . Coronary artery bypass graft  02/14/2006    LIMA to LAD,SVG to posterior descending,SVG to CX,SVG to ramus intermediate.  . Cardiac catheterization  07/04/2006    significant 3 vessel disease: totally vein graft to the ramus intermedius,toalled vein graft to the obtuse marginal, mildly diseased vein graft of the posterior descending artery, mildly depressed LV systolic fx.     Current Outpatient Prescriptions  Medication Sig Dispense Refill  . albuterol (PROAIR HFA) 108 (90 BASE) MCG/ACT inhaler Inhale 1 puff into the lungs daily.    . ARIPiprazole (ABILIFY) 5 MG tablet Take 1 tablet by mouth daily.    [name] atorvastatin (LIPITOR) 20 MG tablet Take 1 tablet by mouth daily.    . candesartan (ATACAND) 8 MG tablet Take 8 mg by mouth daily.    . clopidogrel (PLAVIX) 75 MG tablet Take 75 mg by mouth daily.    [name] darifenacin (ENABLEX) 15 MG 24 hr tablet Take 15 mg by mouth daily.    [name] escitalopram (LEXAPRO) 10 MG tablet Take 10 mg by mouth daily.    [name] esomeprazole (NEXIUM) 40 MG capsule Take 40 mg by mouth daily before breakfast.    .  FLUTICASONE FUROATE IN Place 1 puff into the nose 2 (two) times daily.    . Fluticasone-Salmeterol (ADVAIR) 500-50 MCG/DOSE AEPB Inhale 2 puffs into the lungs 2 (two) times daily.    . furosemide (LASIX) 20 MG tablet TAKE 2 TABLETS DAILY 180 tablet 1  . KLOR-CON M10 10 MEQ tablet TAKE 2 TABLETS TWICE A DAY 360 tablet 1  . metoprolol succinate (TOPROL-XL) 25 MG 24 hr tablet Take 1 tablet by mouth daily.    . mirabegron ER (MYRBETRIQ) 50 MG TB24 tablet Take 50 mg by mouth daily. Taking two po daily    . MYRBETRIQ 25 MG TB24 tablet Take 25 mg by mouth daily.  0  . nystatin cream (MYCOSTATIN) Apply 1 application topically daily.  0  . sildenafil (VIAGRA) 100 MG tablet Take 100 mg by mouth as needed.    . tiotropium (SPIRIVA HANDIHALER) 18 MCG inhalation capsule Place 1 Inhaler into  inhaler and inhale daily.    . Urea 50 % CREA Apply 1 application topically 3 (three) times daily.  0   No current facility-administered medications for this visit.    Allergies:   Lipitor    Social History:  The patient  reports that he has quit smoking. He does not have any smokeless tobacco history on file. He reports that he does not drink alcohol or use illicit drugs.  Family History:  The patient's family history includes Heart attack in his father.    ROS:  General:no colds or fevers, no weight changes Skin:no rashes or ulcers HEENT:no blurred vision, no congestion CV:see HPI PUL:see HPI GI:no diarrhea constipation or melena, no indigestion GU:no hematuria, no dysuria MS:no joint pain, no claudication Neuro:near syncope, + lightheadedness Endo:no diabetes, no thyroid disease  Wt Readings from Last 3 Encounters:  08/26/14 248 lb 8 oz (112.719 kg)  06/21/14 248 lb 4.8 oz (112.628 kg)  03/23/14 248 lb 12.8 oz (112.855 kg)     PHYSICAL EXAM: VS:  BP 130/70 mmHg  Pulse 79  Ht 5\' 9"  (1.753 m)  Wt 248 lb 8 oz (112.719 kg)  BMI 36.68 kg/m2  SpO2 92% , BMI Body mass index is 36.68 kg/(m^2).  SP02 with ambulation RA =80% General:Pleasant affect, NAD Skin:Warm and dry, brisk capillary refill HEENT:normocephalic, sclera clear, mucus membranes moist Neck:supple, no JVD, no bruits  Heart:S1S2 RRR without murmur, gallup, rub or click Lungs:clear without rales, rhonchi, or wheezes TWS:FKCL, non tender, + BS, do not palpate liver spleen or masses Ext:no lower ext edema, 2+ pedal pulses, 2+ radial pulses Neuro:alert and oriented, MAE, follows commands, + facial symmetry    EKG:  EKG is ordered today. The ekg ordered today demonstrates SR with PVC no acute changes from previous EKG except PVC   Recent Labs: 01/25/2014: ALT 21 03/23/2014: BUN 14; Creatinine 1.21; Potassium 4.2; Sodium 138    Lipid Panel    Component Value Date/Time   CHOL 108 01/25/2014 0829   TRIG  211* 01/25/2014 0829   HDL 34* 01/25/2014 0829   CHOLHDL 3.2 01/25/2014 0829   VLDL 42* 01/25/2014 0829   LDLCALC 32 01/25/2014 0829       Other studies Reviewed: Additional studies/ records that were reviewed today include: previous notes.   ASSESSMENT AND PLAN:  1.  Near syncope with PVC on EKG - previous syncope with MI.  Will obtain lexiscan myoview,  Event monitor for 30 days. He will follow up with Dr. Jerilynn Mages. Croitoru.  2. Hypoxia, he has home oxygen but does  not use.  He does not like to wear, we discussed importance of wearing that low oxygen could cause symptoms.    3. PVCs - arrhthymias may  Be cause on near syncope.    Current medicines are reviewed with the patient today.  The patient Has no concerns regarding medicines.  The following changes have been made:  See above Labs/ tests ordered today include:see above  Disposition:   FU:  see above  Signed, Isaiah Serge, NP  08/26/2014 11:19 AM    [facility], [place], [place], [place] Phone: [phone]; Fax: [phone]

## 2014-08-26 NOTE — Patient Instructions (Signed)
Your physician has recommended that you wear an event monitor. Event monitors are medical devices that record the heart's electrical activity. Doctors most often [place] these monitors to diagnose arrhythmias. Arrhythmias are problems with the speed or rhythm of the heartbeat. The monitor is a small, portable device. You can wear one while you do your normal daily activities. This is usually used to diagnose what is causing palpitations/syncope (passing out).  Your physician has requested that you have a lexiscan myoview. For further information please visit [email or web address]. Please follow instruction sheet, as given.  Your physician recommends that you schedule a follow-up appointment in: 3-4 weeks with Mickel Baas on a day Dr. Sallyanne Kuster is in the office.

## 2014-08-28 DIAGNOSIS — J441 Chronic obstructive pulmonary disease with (acute) exacerbation: Secondary | ICD-10-CM | POA: Diagnosis not present

## 2014-08-28 DIAGNOSIS — R069 Unspecified abnormalities of breathing: Secondary | ICD-10-CM | POA: Diagnosis not present

## 2014-08-28 DIAGNOSIS — R509 Fever, unspecified: Secondary | ICD-10-CM | POA: Diagnosis not present

## 2014-08-28 DIAGNOSIS — R0602 Shortness of breath: Secondary | ICD-10-CM | POA: Diagnosis not present

## 2014-08-28 DIAGNOSIS — J40 Bronchitis, not specified as acute or chronic: Secondary | ICD-10-CM | POA: Diagnosis not present

## 2014-08-28 DIAGNOSIS — R197 Diarrhea, unspecified: Secondary | ICD-10-CM | POA: Diagnosis not present

## 2014-08-28 DIAGNOSIS — J45909 Unspecified asthma, uncomplicated: Secondary | ICD-10-CM | POA: Diagnosis not present

## 2014-08-28 DIAGNOSIS — J449 Chronic obstructive pulmonary disease, unspecified: Secondary | ICD-10-CM | POA: Diagnosis not present

## 2014-08-28 DIAGNOSIS — J9811 Atelectasis: Secondary | ICD-10-CM | POA: Diagnosis not present

## 2014-08-28 DIAGNOSIS — I10 Essential (primary) hypertension: Secondary | ICD-10-CM | POA: Diagnosis not present

## 2014-08-28 DIAGNOSIS — R0902 Hypoxemia: Secondary | ICD-10-CM | POA: Diagnosis not present

## 2014-08-30 NOTE — Addendum Note (Signed)
Addended by: Vear Clock on: 08/30/2014 03:45 PM   Modules accepted: Orders

## 2014-08-31 DIAGNOSIS — I2510 Atherosclerotic heart disease of native coronary artery without angina pectoris: Secondary | ICD-10-CM | POA: Diagnosis not present

## 2014-08-31 DIAGNOSIS — I5042 Chronic combined systolic (congestive) and diastolic (congestive) heart failure: Secondary | ICD-10-CM | POA: Diagnosis not present

## 2014-08-31 DIAGNOSIS — J441 Chronic obstructive pulmonary disease with (acute) exacerbation: Secondary | ICD-10-CM | POA: Diagnosis not present

## 2014-08-31 DIAGNOSIS — R0902 Hypoxemia: Secondary | ICD-10-CM | POA: Diagnosis not present

## 2014-08-31 DIAGNOSIS — J189 Pneumonia, unspecified organism: Secondary | ICD-10-CM | POA: Diagnosis not present

## 2014-08-31 DIAGNOSIS — R0602 Shortness of breath: Secondary | ICD-10-CM | POA: Diagnosis not present

## 2014-08-31 DIAGNOSIS — J440 Chronic obstructive pulmonary disease with acute lower respiratory infection: Secondary | ICD-10-CM | POA: Diagnosis not present

## 2014-08-31 DIAGNOSIS — J9611 Chronic respiratory failure with hypoxia: Secondary | ICD-10-CM | POA: Diagnosis not present

## 2014-08-31 DIAGNOSIS — R05 Cough: Secondary | ICD-10-CM | POA: Diagnosis not present

## 2014-09-03 ENCOUNTER — Telehealth (HOSPITAL_COMMUNITY): Payer: Self-pay

## 2014-09-03 NOTE — Telephone Encounter (Signed)
Encounter complete. 

## 2014-09-08 ENCOUNTER — Ambulatory Visit (HOSPITAL_COMMUNITY)
Admission: RE | Admit: 2014-09-08 | Discharge: 2014-09-08 | Disposition: A | Discharge status: Home / Self Care | Payer: Medicare Other | Source: Ambulatory Visit | Attending: Internal Medicine | Admitting: Internal Medicine

## 2014-09-08 ENCOUNTER — Telehealth: Payer: Self-pay | Admitting: *Deleted

## 2014-09-08 ENCOUNTER — Encounter (HOSPITAL_COMMUNITY): Payer: Medicare Other

## 2014-09-08 DIAGNOSIS — R0789 Other chest pain: Secondary | ICD-10-CM | POA: Diagnosis not present

## 2014-09-08 DIAGNOSIS — R0602 Shortness of breath: Secondary | ICD-10-CM | POA: Insufficient documentation

## 2014-09-08 DIAGNOSIS — R002 Palpitations: Secondary | ICD-10-CM | POA: Insufficient documentation

## 2014-09-08 LAB — MYOCARDIAL PERFUSION IMAGING
CHL CUP NUCLEAR SSS: 12
CHL CUP RESTING HR STRESS: 71 {beats}/min
CHL CUP STRESS STAGE 1 HR: 65 {beats}/min
CHL CUP STRESS STAGE 1 SBP: 116 mmHg
CHL CUP STRESS STAGE 2 HR: 65 {beats}/min
CHL CUP STRESS STAGE 2 SPEED: 0 mph
CHL CUP STRESS STAGE 3 DBP: 60 mmHg
CHL CUP STRESS STAGE 3 HR: 68 {beats}/min
CHL CUP STRESS STAGE 3 SBP: 111 mmHg
CSEPEW: 1 METS
LV dias vol: 136 mL
LVSYSVOL: 73 mL
NUC STRESS TID: 1.15
Nuc Stress EF: 46 %
Peak BP: 111 mmHg
Peak HR: 68 {beats}/min
Percent of predicted max HR: 43 %
SDS: 1
SRS: 11
Stage 1 DBP: 66 mmHg
Stage 1 Grade: 0 %
Stage 1 Speed: 0 mph
Stage 2 Grade: 0 %
Stage 3 Grade: 0 %
Stage 3 Speed: 0 mph
Stage 4 Grade: 0 %
Stage 4 HR: 70 {beats}/min
Stage 4 Speed: 0 mph

## 2014-09-08 MED ORDER — REGADENOSON 0.4 MG/5ML IV SOLN
0.4000 mg | Freq: Once | INTRAVENOUS | Status: AC
  Administered 2014-09-08: 0.4 mg via INTRAVENOUS

## 2014-09-08 MED ORDER — TECHNETIUM TC 99M SESTAMIBI GENERIC - CARDIOLITE
11.0000 | Freq: Once | INTRAVENOUS | Status: AC | PRN
  Administered 2014-09-08: 11 via INTRAVENOUS

## 2014-09-08 MED ORDER — METOPROLOL SUCCINATE ER 50 MG PO TB24: 50.0000 mg | ORAL_TABLET | Freq: Every day | ORAL | Status: DC

## 2014-09-08 MED ORDER — AMINOPHYLLINE 25 MG/ML IV SOLN
100.0000 mg | Freq: Once | INTRAVENOUS | Status: AC
  Administered 2014-09-08: 100 mg via INTRAVENOUS

## 2014-09-08 MED ORDER — TECHNETIUM TC 99M SESTAMIBI GENERIC - CARDIOLITE
30.8000 | Freq: Once | INTRAVENOUS | Status: AC | PRN
  Administered 2014-09-08: 30.8 via INTRAVENOUS

## 2014-09-08 NOTE — Telephone Encounter (Signed)
[facility] with Event Monitor results.  Dr. Loletha Grayer wants patient to increase Metoprolol ER to 50mg  daily.  New Rx sent to pharmacy.

## 2014-09-08 NOTE — Telephone Encounter (Signed)
[name] is returning your call .[name] Please call

## 2014-09-08 NOTE — Telephone Encounter (Signed)
Patient returned call.  Instructed to increase Metoprolol ER to 50mg  daily due to episodes of increased/irregular heart beats on Event Monitor.  Voiced understanding.  Will double up on current Rx and new Rx sent to [facility].

## 2014-09-09 DIAGNOSIS — R0602 Shortness of breath: Secondary | ICD-10-CM | POA: Diagnosis not present

## 2014-09-10 DIAGNOSIS — C61 Malignant neoplasm of prostate: Secondary | ICD-10-CM | POA: Diagnosis not present

## 2014-09-13 ENCOUNTER — Other Ambulatory Visit: Payer: Self-pay | Admitting: Internal Medicine

## 2014-09-13 NOTE — Telephone Encounter (Signed)
Rx has been sent to the pharmacy electronically. ° °

## 2014-09-16 DIAGNOSIS — L400 Psoriasis vulgaris: Secondary | ICD-10-CM | POA: Diagnosis not present

## 2014-09-16 DIAGNOSIS — Z79899 Other long term (current) drug therapy: Secondary | ICD-10-CM | POA: Diagnosis not present

## 2014-09-21 ENCOUNTER — Encounter: Payer: Self-pay | Admitting: Cardiology

## 2014-09-21 ENCOUNTER — Ambulatory Visit (INDEPENDENT_AMBULATORY_CARE_PROVIDER_SITE_OTHER): Payer: Medicare Other | Admitting: Cardiology

## 2014-09-21 VITALS — BP 124/64 | HR 72 | Wt 249.4 lb

## 2014-09-21 DIAGNOSIS — I255 Ischemic cardiomyopathy: Secondary | ICD-10-CM | POA: Diagnosis not present

## 2014-09-21 DIAGNOSIS — R0602 Shortness of breath: Secondary | ICD-10-CM

## 2014-09-21 DIAGNOSIS — R55 Syncope and collapse: Secondary | ICD-10-CM

## 2014-09-21 DIAGNOSIS — E785 Hyperlipidemia, unspecified: Secondary | ICD-10-CM

## 2014-09-21 DIAGNOSIS — Z951 Presence of aortocoronary bypass graft: Secondary | ICD-10-CM | POA: Diagnosis not present

## 2014-09-21 DIAGNOSIS — G4730 Sleep apnea, unspecified: Secondary | ICD-10-CM

## 2014-09-21 DIAGNOSIS — I25709 Atherosclerosis of coronary artery bypass graft(s), unspecified, with unspecified angina pectoris: Secondary | ICD-10-CM | POA: Diagnosis not present

## 2014-09-21 NOTE — Progress Notes (Signed)
Cardiology Office Note   Date:  09/21/2014   ID:  Jay [name], DOB Nov 03, 1950, MRN [id number]  PCP:  [name], MD  Cardiologist:  Dr. Bertrum Sol     Chief Complaint  Patient presents with  . Follow-up    no chest pain/slight sob/occassional dizziness/no liughtheadedness/no cramping/slight edema in lower exermeties      History of Present Illness: Jay [name] is a 64 y.o. male who presents for evaluation of his near syncope.    He was seen 08/26/14 for shortness of breath and near syncope. He has had several- 5 episodes in last month of feeling he may pass out. These occur more with standing or bending over and getting up. No associated nausea or diaphoresis and no chest pain. The only thing he can relate this to is when he believes he had an MI and with that he passed out. He has no palpitations but does have PVCs on EKG. He has oxygen at home that he should wear but does not- he does not like to wear. Today with ambulation his sp02 dropped to 80.   He has a history of coronary artery disease and underwent multivessel bypass surgery in 2007 (LIMA to LAD, SVG to PDA, SVG to OM, SVG to ramus intermedius) and underwent repeat cardiac catheterization in 2008: He had interim total occlusion of the vein graft to the OM and ramus intermedius, patent but mildly diseased SVG to PDA and a patent LIMA to the LAD. At that time his left ventricular ejection fraction was 40-45% and he had evidence of elevated left ventricular end-diastolic pressure. Most recent echo shows an ejection fraction of around 40% in my review. He has obstructive sleep apnea but is poorly compliant with CPAP. He has well treated hypercholesterolemia with mild residual hypertriglyceridemia and a low HDL. He has psoriasis and psoriatic arthritis..  We did a nuc study with fixed defect but no changes.  Low risk. And event monitor with SR and PACs only.  Pt states he became worse and saw PCP  And he  was treated with ABX.  Now much better.  He was reassured, recheck of sp02 was 92% on room air.  Past Medical History  Diagnosis Date  . S/P CABG x 4 2007    Dr.  [name]  . Coronary artery disease   . Obstructive sleep apnea on CPAP   . Hyperlipidemia   . Psoriasis   . Arthritis   . Ischemic cardiomyopathy     Ejection fraction 40-45%  . Tobacco abuse   . Obesity, Class II, BMI 35-39.9     Past Surgical History  Procedure Laterality Date  . Coronary artery bypass graft  02/14/2006    LIMA to LAD,SVG to posterior descending,SVG to CX,SVG to ramus intermediate.  . Cardiac catheterization  07/04/2006    significant 3 vessel disease: totally vein graft to the ramus intermedius,toalled vein graft to the obtuse marginal, mildly diseased vein graft of the posterior descending artery, mildly depressed LV systolic fx.     Current Outpatient Prescriptions  Medication Sig Dispense Refill  . albuterol (PROAIR HFA) 108 (90 BASE) MCG/ACT inhaler Inhale 1 puff into the lungs daily.    . ARIPiprazole (ABILIFY) 5 MG tablet Take 1 tablet by mouth daily.    [name] atorvastatin (LIPITOR) 20 MG tablet Take 1 tablet by mouth daily.    . candesartan (ATACAND) 8 MG tablet Take 8 mg by mouth daily.    . clopidogrel (PLAVIX) 75  MG tablet Take 75 mg by mouth daily.    [name] darifenacin (ENABLEX) 15 MG 24 hr tablet Take 15 mg by mouth daily.    [name] escitalopram (LEXAPRO) 10 MG tablet Take 10 mg by mouth daily.    [name] esomeprazole (NEXIUM) 40 MG capsule Take 40 mg by mouth daily before breakfast.    . FLUTICASONE FUROATE IN Place 1 puff into the nose 2 (two) times daily.    . Fluticasone-Salmeterol (ADVAIR) 500-50 MCG/DOSE AEPB Inhale 2 puffs into the lungs 2 (two) times daily.    . furosemide (LASIX) 20 MG tablet TAKE 2 TABLETS DAILY 60 tablet 0  . KLOR-CON M10 10 MEQ tablet TAKE 2 TABLETS TWICE A DAY 120 tablet 0  . metoprolol succinate (TOPROL-XL) 50 MG 24 hr tablet Take 1 tablet (50 mg total) by mouth daily. Take  with or immediately following a meal. 90 tablet 2  . mirabegron ER (MYRBETRIQ) 50 MG TB24 tablet Take 50 mg by mouth daily. Taking two po daily    . nystatin cream (MYCOSTATIN) Apply 1 application topically daily.  0  . sildenafil (VIAGRA) 100 MG tablet Take 100 mg by mouth as needed.    . tiotropium (SPIRIVA HANDIHALER) 18 MCG inhalation capsule Place 1 Inhaler into inhaler and inhale daily.    . Urea 50 % CREA Apply 1 application topically 3 (three) times daily.  0   No current facility-administered medications for this visit.    Allergies:   Lipitor    Social History:  The patient  reports that he has quit smoking. He does not have any smokeless tobacco history on file. He reports that he does not drink alcohol or use illicit drugs.   Family History:  The patient's family history includes Heart attack in his father.    ROS:  General:+ colds no fevers, no weight changes Skin:no rashes or ulcers CV:see HPI PUL:see HPI GI:no diarrhea constipation or melena, no indigestion GU:no hematuria, no dysuria Neuro:no syncope, no lightheadedness   Wt Readings from Last 3 Encounters:  09/21/14 249 lb 6.4 oz (113.127 kg)  09/08/14 248 lb (112.492 kg)  08/26/14 248 lb 8 oz (112.719 kg)     PHYSICAL EXAM: VS:  BP 124/64 mmHg  Pulse 72  Wt 249 lb 6.4 oz (113.127 kg) , BMI Body mass index is 36.81 kg/(m^2). General:Pleasant affect, NAD Skin:Warm and dry, brisk capillary refill HEENT:normocephalic, sclera clear, mucus membranes moist Heart:S1S2 RRR without murmur, gallup, rub or click Lungs:clear without rales, rhonchi, or wheezes OYD:XAJO, non tender, + BS, do not palpate liver spleen or masses Ext:no lower ext edema, 2+ pedal pulses, 2+ radial pulses Neuro:alert and oriented, MAE, follows commands, + facial symmetry    EKG:  EKG is NOT ordered today.   Recent Labs: 01/25/2014: ALT 21 03/23/2014: BUN 14; Creatinine 1.21; Potassium 4.2; Sodium 138    Lipid Panel    Component  Value Date/Time   CHOL 108 01/25/2014 0829   TRIG 211* 01/25/2014 0829   HDL 34* 01/25/2014 0829   CHOLHDL 3.2 01/25/2014 0829   VLDL 42* 01/25/2014 0829   LDLCALC 32 01/25/2014 0829       Other studies Reviewed: Additional studies/ records that were reviewed today include: .   ASSESSMENT AND PLAN: 1. Near syncope with PVC on EKG - previous syncope with MI. Lexiscan myoview, with fixed defect but no ischemia and no changes.    He will follow up with Dr. Jerilynn Mages. Croitoru in August.   2. Hypoxia, he  has home oxygen but does not use. He does not like to wear, we discussed on last visit,  importance of wearing that low oxygen could cause symptoms.  He tells me he developed congestion and was treated with Levaquin and now feels much better with no further near syncope episodes.     3. PVCs - arrhthymias may Be cause on near syncope. Wore monitor for 30 days, SR with PACs.   4. CAD with hx CABG.  5. HLD continue current meds.    Current medicines are reviewed with the patient today.  The patient Has no concerns regarding medicines.  The following changes have been made:  See above Labs/ tests ordered today include:see above  Disposition:   FU:  see above  Signed, Isaiah Serge, NP  09/21/2014 11:13 AM    [facility] [place], [place], [place], [place] Phone: [phone]; Fax: [phone]

## 2014-09-21 NOTE — Patient Instructions (Signed)
CONTINUE WITH CURRENT MEDICATIONS  YOU WILL COMPLETE WEARING THE MONITOR 09/24/14.RETURN TO COMPANY  KEEP APPOINTMENT WITH DR Sallyanne Kuster 12/13/2014.

## 2014-09-22 DIAGNOSIS — Z79899 Other long term (current) drug therapy: Secondary | ICD-10-CM | POA: Diagnosis not present

## 2014-09-22 DIAGNOSIS — D509 Iron deficiency anemia, unspecified: Secondary | ICD-10-CM | POA: Diagnosis not present

## 2014-09-22 DIAGNOSIS — R739 Hyperglycemia, unspecified: Secondary | ICD-10-CM | POA: Diagnosis not present

## 2014-09-22 DIAGNOSIS — E785 Hyperlipidemia, unspecified: Secondary | ICD-10-CM | POA: Diagnosis not present

## 2014-09-28 ENCOUNTER — Ambulatory Visit (INDEPENDENT_AMBULATORY_CARE_PROVIDER_SITE_OTHER): Payer: Medicare Other

## 2014-09-28 ENCOUNTER — Other Ambulatory Visit: Payer: Self-pay

## 2014-09-28 DIAGNOSIS — R55 Syncope and collapse: Secondary | ICD-10-CM

## 2014-09-29 ENCOUNTER — Other Ambulatory Visit: Payer: Self-pay

## 2014-09-30 ENCOUNTER — Other Ambulatory Visit: Payer: Self-pay | Admitting: Internal Medicine

## 2014-09-30 MED ORDER — POTASSIUM CHLORIDE CRYS ER 10 MEQ PO TBCR: 20.0000 meq | EXTENDED_RELEASE_TABLET | Freq: Two times a day (BID) | ORAL | Status: DC

## 2014-09-30 MED ORDER — POTASSIUM CHLORIDE CRYS ER 10 MEQ PO TBCR: 20.0000 meq | EXTENDED_RELEASE_TABLET | Freq: Two times a day (BID) | ORAL | Status: AC

## 2014-09-30 MED ORDER — FUROSEMIDE 20 MG PO TABS: 40.0000 mg | ORAL_TABLET | Freq: Every day | ORAL | Status: DC

## 2014-10-01 DIAGNOSIS — C61 Malignant neoplasm of prostate: Secondary | ICD-10-CM | POA: Diagnosis not present

## 2014-10-06 ENCOUNTER — Telehealth: Payer: Self-pay | Admitting: *Deleted

## 2014-10-06 DIAGNOSIS — R55 Syncope and collapse: Secondary | ICD-10-CM

## 2014-10-06 DIAGNOSIS — G4733 Obstructive sleep apnea (adult) (pediatric): Secondary | ICD-10-CM | POA: Diagnosis not present

## 2014-10-06 DIAGNOSIS — I5042 Chronic combined systolic (congestive) and diastolic (congestive) heart failure: Secondary | ICD-10-CM | POA: Diagnosis not present

## 2014-10-06 DIAGNOSIS — I493 Ventricular premature depolarization: Secondary | ICD-10-CM

## 2014-10-06 DIAGNOSIS — J449 Chronic obstructive pulmonary disease, unspecified: Secondary | ICD-10-CM | POA: Diagnosis not present

## 2014-10-06 DIAGNOSIS — J9611 Chronic respiratory failure with hypoxia: Secondary | ICD-10-CM | POA: Diagnosis not present

## 2014-10-06 DIAGNOSIS — I255 Ischemic cardiomyopathy: Secondary | ICD-10-CM

## 2014-10-06 NOTE — Telephone Encounter (Signed)
Patient informed Dr. Loletha Grayer thinks he needs to be evaluated by one of the EP physicians.  Patient is in agreement with this.  Order placed and message sent to scheduling.

## 2014-10-06 NOTE — Telephone Encounter (Signed)
-----   Message from [name], MD sent at 09/28/2014  4:32 PM EDT ----- With his permission, I would like to refer him for EP evaluation. He has had syncope, has frequent PVCs and a brief run of nonsustained VT on his monitor and mild to moderately depressed LVEF due to ischemic cardiomyopathy. May need to consider EP study.

## 2014-10-13 ENCOUNTER — Ambulatory Visit (INDEPENDENT_AMBULATORY_CARE_PROVIDER_SITE_OTHER): Payer: Medicare Other | Admitting: Internal Medicine

## 2014-10-13 ENCOUNTER — Encounter: Payer: Self-pay | Admitting: Internal Medicine

## 2014-10-13 VITALS — BP 108/62 | HR 68 | Ht 69.0 in | Wt 252.0 lb

## 2014-10-13 DIAGNOSIS — E785 Hyperlipidemia, unspecified: Secondary | ICD-10-CM

## 2014-10-13 DIAGNOSIS — I493 Ventricular premature depolarization: Secondary | ICD-10-CM

## 2014-10-13 DIAGNOSIS — I472 Ventricular tachycardia: Secondary | ICD-10-CM | POA: Diagnosis not present

## 2014-10-13 DIAGNOSIS — J438 Other emphysema: Secondary | ICD-10-CM | POA: Diagnosis not present

## 2014-10-13 DIAGNOSIS — I5042 Chronic combined systolic (congestive) and diastolic (congestive) heart failure: Secondary | ICD-10-CM | POA: Diagnosis not present

## 2014-10-13 DIAGNOSIS — I4729 Other ventricular tachycardia: Secondary | ICD-10-CM | POA: Insufficient documentation

## 2014-10-13 NOTE — Patient Instructions (Signed)
Medication Instructions: - no changes  Labwork: - none  Procedures/Testing: - none  Follow-Up: - Dr. Lovena Le will see you back on an as needed basis.  Any Additional Special Instructions Will Be Listed Below (If Applicable). - wear your home oxygen with exertion

## 2014-10-13 NOTE — Assessment & Plan Note (Addendum)
His symptoms are class 2 and he will continue his current meds. A low sodium diet is recommended. I have asked the patient to increase his physical activity. His EF has varied from 35% to 50%. Will follow.

## 2014-10-13 NOTE — Progress Notes (Signed)
HPI [name] is referred today by Cecilie Kicks for evaluation of NSVT, and LV dysfunction  And palpitations. His history dates back to 2007 when he had CABG. He has mild LV dysfunction and underlying lung disease. He has oxygen therapy prescribed but he often does not use it. He has a h/o palpitations and dizzy spells and wore a cardiac monitor which demonstrated NSVT and PVC's.  He reports an episode of frank syncope which occurred over a year ago. During that episode he fell out and does not have any idea what happened. He has had no episodes in over a year.   Allergies  Allergen Reactions  . Lipitor [Atorvastatin]     Myalgia     Current Outpatient Prescriptions  Medication Sig Dispense Refill  . albuterol (PROAIR HFA) 108 (90 BASE) MCG/ACT inhaler Inhale 1 puff into the lungs daily.    . ARIPiprazole (ABILIFY) 5 MG tablet Take 1 tablet by mouth daily.    [name] atorvastatin (LIPITOR) 20 MG tablet Take 1 tablet by mouth daily.    . candesartan (ATACAND) 8 MG tablet Take 8 mg by mouth daily.    . clopidogrel (PLAVIX) 75 MG tablet Take 75 mg by mouth daily.    [name] darifenacin (ENABLEX) 15 MG 24 hr tablet Take 15 mg by mouth daily.    [name] escitalopram (LEXAPRO) 10 MG tablet Take 10 mg by mouth daily.    [name] esomeprazole (NEXIUM) 40 MG capsule Take 40 mg by mouth daily before breakfast.    . Fluticasone-Salmeterol (ADVAIR) 500-50 MCG/DOSE AEPB Inhale 2 puffs into the lungs 2 (two) times daily.    . furosemide (LASIX) 20 MG tablet Take 2 tablets (40 mg total) by mouth daily. 180 tablet 3  . metoprolol succinate (TOPROL-XL) 50 MG 24 hr tablet Take 1 tablet (50 mg total) by mouth daily. Take with or immediately following a meal. 90 tablet 2  . mirabegron ER (MYRBETRIQ) 50 MG TB24 tablet Take 50 mg by mouth daily. Taking two po daily    . potassium chloride (KLOR-CON M10) 10 MEQ tablet Take 2 tablets (20 mEq total) by mouth 2 (two) times daily. 360 tablet 3  . sildenafil (VIAGRA) 100 MG tablet  Take 100 mg by mouth as needed.    . tiotropium (SPIRIVA HANDIHALER) 18 MCG inhalation capsule Place 1 Inhaler into inhaler and inhale daily.    . Urea 50 % CREA Apply 1 application topically 3 (three) times daily.  0   No current facility-administered medications for this visit.     Past Medical History  Diagnosis Date  . S/P CABG x 4 2007    Dr.  [name]  . Coronary artery disease   . Obstructive sleep apnea on CPAP   . Hyperlipidemia   . Psoriasis   . Arthritis   . Ischemic cardiomyopathy     Ejection fraction 40-45%  . Tobacco abuse   . Obesity, Class II, BMI 35-39.9     ROS:   All systems reviewed and negative except as noted in the HPI.   Past Surgical History  Procedure Laterality Date  . Coronary artery bypass graft  02/14/2006    LIMA to LAD,SVG to posterior descending,SVG to CX,SVG to ramus intermediate.  . Cardiac catheterization  07/04/2006    significant 3 vessel disease: totally vein graft to the ramus intermedius,toalled vein graft to the obtuse marginal, mildly diseased vein graft of the posterior descending artery, mildly depressed LV systolic fx.  Family History  Problem Relation Age of Onset  . Heart attack Father      History   Social History  . Marital Status: Married    Spouse Name: N/A  . Number of Children: N/A  . Years of Education: N/A   Occupational History  . Not on file.   Social History Main Topics  . Smoking status: Former Research scientist (life sciences)  . Smokeless tobacco: Not on file  . Alcohol Use: No  . Drug Use: No  . Sexual Activity: Not on file   Other Topics Concern  . Not on file   Social History Narrative     BP 108/62 mmHg  Pulse 68  Ht 5\' 9"  (1.753 m)  Wt 252 lb (114.306 kg)  BMI 37.20 kg/m2  Physical Exam:  Well appearing NAD HEENT: Unremarkable Neck:  No JVD, no thyromegally Lymphatics:  No adenopathy Back:  No CVA tenderness Lungs:  Clear with reduced breath sounds throughout. HEART:  Regular rate rhythm, no  murmurs, no rubs, no clicks Abd:  soft, positive bowel sounds, no organomegally, no rebound, no guarding Ext:  2 plus pulses, no edema, no cyanosis, no clubbing Skin:  No rashes no nodules Neuro:  CN II through XII intact, motor grossly intact  EKG NSR with inferior MI  Assess/Plan:

## 2014-10-13 NOTE — Assessment & Plan Note (Signed)
He is minimally symptomatic on my questioning. 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 I have recommended a period of watchful waiting. We considered invasive EP testing as well as ILR insertion but both likely low yield. Will follow clinicially.

## 2014-10-13 NOTE — Assessment & Plan Note (Signed)
He does not like to use oxygen therapy. I have encouraged him to do so, especially when he is physically active.

## 2014-10-13 NOTE — Assessment & Plan Note (Signed)
He will continue his stating therapy. A low fat diet is recommended.

## 2014-10-21 DIAGNOSIS — Z8546 Personal history of malignant neoplasm of prostate: Secondary | ICD-10-CM | POA: Diagnosis not present

## 2014-10-21 DIAGNOSIS — J449 Chronic obstructive pulmonary disease, unspecified: Secondary | ICD-10-CM | POA: Diagnosis not present

## 2014-10-21 DIAGNOSIS — D649 Anemia, unspecified: Secondary | ICD-10-CM | POA: Diagnosis not present

## 2014-10-21 DIAGNOSIS — K219 Gastro-esophageal reflux disease without esophagitis: Secondary | ICD-10-CM | POA: Diagnosis not present

## 2014-10-21 DIAGNOSIS — R7989 Other specified abnormal findings of blood chemistry: Secondary | ICD-10-CM | POA: Diagnosis not present

## 2014-10-21 DIAGNOSIS — E538 Deficiency of other specified B group vitamins: Secondary | ICD-10-CM | POA: Diagnosis not present

## 2014-10-21 DIAGNOSIS — Z87891 Personal history of nicotine dependence: Secondary | ICD-10-CM | POA: Diagnosis not present

## 2014-12-13 ENCOUNTER — Encounter: Payer: Self-pay | Admitting: Cardiovascular Disease

## 2014-12-13 ENCOUNTER — Ambulatory Visit (INDEPENDENT_AMBULATORY_CARE_PROVIDER_SITE_OTHER): Payer: Medicare HMO | Admitting: Cardiovascular Disease

## 2014-12-13 VITALS — BP 128/68 | HR 70 | Ht 69.0 in | Wt 253.3 lb

## 2014-12-13 DIAGNOSIS — I255 Ischemic cardiomyopathy: Secondary | ICD-10-CM

## 2014-12-13 DIAGNOSIS — I472 Ventricular tachycardia: Secondary | ICD-10-CM | POA: Diagnosis not present

## 2014-12-13 DIAGNOSIS — I5042 Chronic combined systolic (congestive) and diastolic (congestive) heart failure: Secondary | ICD-10-CM

## 2014-12-13 DIAGNOSIS — Z87891 Personal history of nicotine dependence: Secondary | ICD-10-CM

## 2014-12-13 DIAGNOSIS — I25709 Atherosclerosis of coronary artery bypass graft(s), unspecified, with unspecified angina pectoris: Secondary | ICD-10-CM | POA: Diagnosis not present

## 2014-12-13 DIAGNOSIS — E785 Hyperlipidemia, unspecified: Secondary | ICD-10-CM

## 2014-12-13 DIAGNOSIS — IMO0001 Reserved for inherently not codable concepts without codable children: Secondary | ICD-10-CM

## 2014-12-13 DIAGNOSIS — J438 Other emphysema: Secondary | ICD-10-CM

## 2014-12-13 DIAGNOSIS — I4729 Other ventricular tachycardia: Secondary | ICD-10-CM

## 2014-12-13 DIAGNOSIS — G4730 Sleep apnea, unspecified: Secondary | ICD-10-CM

## 2014-12-13 NOTE — Progress Notes (Signed)
Patient ID: Jay [name], male   DOB: [birth year], 64 y.o.   MRN: [id number]     Cardiology Office Note   Date:  12/13/2014   ID:  Jay [name], DOB 1950-12-23, MRN [id number]  PCP:  Jay Geralds, MD  Cardiologist:  Jay Peru, MD;  Jay Klein, MD   No chief complaint on file.     History of Present Illness: Jay [name] is a 64 y.o. male who presents for  Follow-up for coronary artery disease, ischemic cardiomyopathy a recent episodes of near syncope and nonsustained ventricular tachycardia. Although the relationship between ventricular arrhythmia and his episodes of near syncope was not proven, I referred him to electrophysiology in consultation. By the time he saw Dr. Lovena Le his symptoms had essentially resolved. The decision was made to proceed conservatively, without an EP study.   He has not had angina pectoris or any change in his chronic dyspnea. He has moderate to severe (functional class III) exertional dyspnea that may be partly related to heart failure, but mostly to chronic lung disease ( "my lungs are at 46%").  He has a history of coronary artery disease and underwent multivessel bypass surgery in 2007 (LIMA to LAD, SVG to PDA, SVG to OM, SVG to ramus intermedius) and underwent repeat cardiac catheterization in 2008: He had interim total occlusion of the vein graft to the OM and ramus intermedius, patent but mildly diseased SVG to PDA and a patent LIMA to the LAD. At that time his left ventricular ejection fraction was 40-45% and he had evidence of elevated left ventricular end-diastolic pressure. Most recent echo shows an ejection fraction of around 40% in my review. He has obstructive sleep apnea but is poorly compliant with CPAP. He has well treated hypercholesterolemia with mild residual hypertriglyceridemia and a low HDL. He has psoriasis and psoriatic arthritis on treatment with Enbrel.  Past Medical History  Diagnosis Date  . S/P CABG x 4 2007   Dr.  [name]  . Coronary artery disease   . Obstructive sleep apnea on CPAP   . Hyperlipidemia   . Psoriasis   . Arthritis   . Ischemic cardiomyopathy     Ejection fraction 40-45%  . Tobacco abuse   . Obesity, Class II, BMI 35-39.9     Past Surgical History  Procedure Laterality Date  . Coronary artery bypass graft  02/14/2006    LIMA to LAD,SVG to posterior descending,SVG to CX,SVG to ramus intermediate.  . Cardiac catheterization  07/04/2006    significant 3 vessel disease: totally vein graft to the ramus intermedius,toalled vein graft to the obtuse marginal, mildly diseased vein graft of the posterior descending artery, mildly depressed LV systolic fx.     Current Outpatient Prescriptions  Medication Sig Dispense Refill  . albuterol (PROAIR HFA) 108 (90 BASE) MCG/ACT inhaler Inhale 1 puff into the lungs daily.    [name] atorvastatin (LIPITOR) 20 MG tablet Take 1 tablet by mouth daily.    . candesartan (ATACAND) 8 MG tablet Take 8 mg by mouth daily.    . clopidogrel (PLAVIX) 75 MG tablet Take 75 mg by mouth daily.    [name] darifenacin (ENABLEX) 15 MG 24 hr tablet Take 15 mg by mouth daily.    [name] escitalopram (LEXAPRO) 10 MG tablet Take 10 mg by mouth daily.    [name] esomeprazole (NEXIUM) 40 MG capsule Take 40 mg by mouth daily before breakfast.    . Fluticasone-Salmeterol (ADVAIR) 500-50 MCG/DOSE AEPB Inhale 2 puffs into the lungs  2 (two) times daily.    . furosemide (LASIX) 20 MG tablet Take 2 tablets (40 mg total) by mouth daily. 180 tablet 3  . metoprolol succinate (TOPROL-XL) 50 MG 24 hr tablet Take 1 tablet (50 mg total) by mouth daily. Take with or immediately following a meal. 90 tablet 2  . mirabegron ER (MYRBETRIQ) 50 MG TB24 tablet Take 50 mg by mouth daily. Taking two po daily    . potassium chloride (KLOR-CON M10) 10 MEQ tablet Take 2 tablets (20 mEq total) by mouth 2 (two) times daily. 360 tablet 3  . sildenafil (VIAGRA) 100 MG tablet Take 100 mg by mouth as needed.    .  tiotropium (SPIRIVA HANDIHALER) 18 MCG inhalation capsule Place 1 Inhaler into inhaler and inhale daily.    . Urea 50 % CREA Apply 1 application topically 3 (three) times daily.  0   No current facility-administered medications for this visit.    Allergies:   Lipitor    Social History:  The patient  reports that he has quit smoking. He does not have any smokeless tobacco history on file. He reports that he does not drink alcohol or use illicit drugs.   Family History:  The patient's family history includes Heart attack in his father.    ROS:  Please see the history of present illness.    Otherwise, review of systems positive for  Skin rash from psoriasis, generalized arthralgia especially in his hands and knees , chronic dyspnea.   All other systems are reviewed and negative.    PHYSICAL EXAM: VS:  BP 128/68 mmHg  Pulse 70  Ht 5\' 9"  (1.753 m)  Wt 253 lb 4.8 oz (114.896 kg)  BMI 37.39 kg/m2 , BMI Body mass index is 37.39 kg/(m^2).  General: Alert, oriented x3, no distress Head: no evidence of trauma, PERRL, EOMI, no exophtalmos or lid lag, no myxedema, no xanthelasma; normal ears, nose and oropharynx Neck: normal jugular venous pulsations and no hepatojugular reflux; brisk carotid pulses without delay and no carotid bruits Chest: clear to auscultation, no signs of consolidation by percussion or palpation, normal fremitus, symmetrical and full respiratory excursions Cardiovascular: normal position and quality of the apical impulse, regular rhythm, normal first and second heart sounds, no murmurs, rubs or gallops Abdomen: no tenderness or distention, no masses by palpation, no abnormal pulsatility or arterial bruits, normal bowel sounds, no hepatosplenomegaly Extremities: no clubbing, cyanosis or edema; 2+ radial, ulnar and brachial pulses bilaterally; 2+ right femoral, posterior tibial and dorsalis pedis pulses; 2+ left femoral, posterior tibial and dorsalis pedis pulses; no subclavian  or femoral bruits Neurological: grossly nonfocal Psych: euthymic mood, full affect  small psoriatic plaques in the periumbilical area and on both elbows   EKG:  EKG is not ordered today.   Recent Labs: 01/25/2014: ALT 21 03/23/2014: BUN 14; Creat 1.21; Potassium 4.2; Sodium 138    Lipid Panel    Component Value Date/Time   CHOL 108 01/25/2014 0829   TRIG 211* 01/25/2014 0829   HDL 34* 01/25/2014 0829   CHOLHDL 3.2 01/25/2014 0829   VLDL 42* 01/25/2014 0829   LDLCALC 32 01/25/2014 0829      Wt Readings from Last 3 Encounters:  12/13/14 253 lb 4.8 oz (114.896 kg)  10/13/14 252 lb (114.306 kg)  09/21/14 249 lb 6.4 oz (113.127 kg)    .   ASSESSMENT AND PLAN:  Chronic combined systolic and diastolic heart failure  As far as I can tell, he is euvolemic.  On chronic loop diuretic. Has not required metolazone in at least 6 months. Reminded him about the importance of daily weight monitoring and sodium restriction. Especially cautious with sodium intake when receiving steroids or taking nonsteroidal anti-inflammatory drugs. Reviewed ways to distinguish heart failure from COPD exacerbation, which often is a very difficult challenge.  Moderate ischemic cardiomyopathy  EF approximate 40%. On angiotensin receptor blocker and beta blocker in maximum tolerated doses.   CAD s/p CABG with extensive native and graft disease  No angina. Most recent nuclear stress test showed a large inferolateral scar but no reversible ischemia.  COPD   No longer smokes.  Obstructive sleep apnea  Discussed importance of compliance with CPAP to avoid progression of cardiac and pulmonary disease.  Obesity  Weight loss would be highly beneficial.  Hypertension  Well-controlled  Hyperlipidemia  Excellent recent lipid profile as far as the LDL is concerned. Triglycerides and HDL consistent with metabolic syndrome.  Psoriasis  Seems to be hemodynamically stable, need to keep in mind the  fact that Enbrel can worsen heart failure   Near syncope  possibly symptoms related to hypovolemia. They have now resolved. Brief episode of nonsustained ventricular tachycardia was recorded on the event monitor, but no clear correlation with symptoms.   Current medicines are reviewed at length with the patient today.  The patient does not have concerns regarding medicines.  The following changes have been made:  no change  Labs/ tests ordered today include:  No orders of the defined types were placed in this encounter.     Patient Instructions  Dr. Sallyanne Kuster recommends that you schedule a follow-up appointment in: ONE YEAR       Signed, Jay Klein, MD  12/13/2014 1:25 PM    Jay Klein, MD, [facility] [phone] office [phone] pager

## 2014-12-13 NOTE — Patient Instructions (Signed)
Dr. Croitoru recommends that you schedule a follow-up appointment in: ONE YEAR   

## 2015-04-07 DIAGNOSIS — I5042 Chronic combined systolic (congestive) and diastolic (congestive) heart failure: Secondary | ICD-10-CM | POA: Diagnosis not present

## 2015-04-07 DIAGNOSIS — I2510 Atherosclerotic heart disease of native coronary artery without angina pectoris: Secondary | ICD-10-CM | POA: Diagnosis not present

## 2015-04-07 DIAGNOSIS — J449 Chronic obstructive pulmonary disease, unspecified: Secondary | ICD-10-CM | POA: Diagnosis not present

## 2015-04-07 DIAGNOSIS — G4733 Obstructive sleep apnea (adult) (pediatric): Secondary | ICD-10-CM | POA: Diagnosis not present

## 2015-04-07 DIAGNOSIS — J9611 Chronic respiratory failure with hypoxia: Secondary | ICD-10-CM | POA: Diagnosis not present

## 2015-09-24 ENCOUNTER — Other Ambulatory Visit: Payer: Self-pay | Admitting: Internal Medicine

## 2015-09-27 NOTE — Telephone Encounter (Signed)
Rx has been sent to the pharmacy electronically. ° °

## 2015-10-07 DIAGNOSIS — G4733 Obstructive sleep apnea (adult) (pediatric): Secondary | ICD-10-CM | POA: Diagnosis not present

## 2015-10-07 DIAGNOSIS — J449 Chronic obstructive pulmonary disease, unspecified: Secondary | ICD-10-CM | POA: Diagnosis not present

## 2015-10-07 DIAGNOSIS — J9611 Chronic respiratory failure with hypoxia: Secondary | ICD-10-CM | POA: Diagnosis not present

## 2015-10-07 DIAGNOSIS — Z9989 Dependence on other enabling machines and devices: Secondary | ICD-10-CM | POA: Diagnosis not present

## 2015-10-17 ENCOUNTER — Other Ambulatory Visit: Payer: Self-pay | Admitting: Gastroenterology

## 2015-10-18 ENCOUNTER — Telehealth: Payer: Self-pay

## 2015-10-18 NOTE — Telephone Encounter (Signed)
lmtcb  Patient needs cardiac clearance prior to a colonoscopy scheduled for 10/28/15.

## 2015-10-18 NOTE — Telephone Encounter (Signed)
Yes .. Ok to hold plavix for 5 days prior to colonoscopy and resume 24 hours afterwards.  Dr. Lemmie Evens (for Dr. Loletha Grayer)

## 2015-10-18 NOTE — Telephone Encounter (Signed)
Returned call to patient Dr.Hilty's recommendations given. 

## 2015-10-18 NOTE — Telephone Encounter (Signed)
Follow-up     Returning the nurses phone call

## 2015-10-18 NOTE — Telephone Encounter (Signed)
Returned call to patient.He stated he is scheduled for colonoscopy 10/28/15.Stated he wanted to ask Dr.Croitoru if ok to hold plavix.Advised Dr.Croitoru out of office.I will send message to Dr.Hilty who is covering his calls for advice.

## 2015-10-19 ENCOUNTER — Encounter (HOSPITAL_COMMUNITY): Payer: Self-pay | Admitting: *Deleted

## 2015-10-26 ENCOUNTER — Telehealth: Payer: Self-pay

## 2015-10-26 NOTE — Telephone Encounter (Signed)
Opened in error

## 2015-10-27 NOTE — Anesthesia Preprocedure Evaluation (Signed)
Anesthesia Evaluation  Patient identified by MRN, date of birth, ID band Patient awake    Reviewed: Allergy & Precautions, H&P , NPO status , Patient's Chart, lab work & pertinent test results, reviewed documented beta blocker date and time   Airway Mallampati: II  TM Distance: >3 FB Neck ROM: full    Dental no notable dental hx. (+) Dental Advisory Given, Teeth Intact   Pulmonary sleep apnea and Continuous Positive Airway Pressure Ventilation , COPD,  COPD inhaler, former smoker,    Pulmonary exam normal breath sounds clear to auscultation       Cardiovascular hypertension, Pt. on home beta blockers + CAD, + Past MI, + CABG and +CHF  Normal cardiovascular exam+ dysrhythmias Ventricular Tachycardia  Rhythm:regular Rate:Normal  Ischemic cardiomyopathy EF 40%. Diastolic dysfunction and CHF. Syncope. Non-sustained VT   Neuro/Psych negative neurological ROS  negative psych ROS   GI/Hepatic negative GI ROS, Neg liver ROS, GERD  Medicated and Controlled,  Endo/Other  diabetes, Well Controlled, Type 2Diet controlled DM  Renal/GU negative Renal ROS  negative genitourinary   Musculoskeletal   Abdominal   Peds  (+) Delivery details - Hematology negative hematology ROS (+)   Anesthesia Other Findings   Reproductive/Obstetrics negative OB ROS                             Anesthesia Physical Anesthesia Plan  ASA: III  Anesthesia Plan: MAC   Post-op Pain Management:    Induction:   Airway Management Planned:   Additional Equipment:   Intra-op Plan:   Post-operative Plan:   Informed Consent: I have reviewed the patients History and Physical, chart, labs and discussed the procedure including the risks, benefits and alternatives for the proposed anesthesia with the patient or authorized representative who has indicated his/her understanding and acceptance.   Dental Advisory Given  Plan  Discussed with: CRNA  Anesthesia Plan Comments:         Anesthesia Quick Evaluation

## 2015-10-28 ENCOUNTER — Ambulatory Visit (HOSPITAL_COMMUNITY)
Admission: RE | Admit: 2015-10-28 | Discharge: 2015-10-28 | Disposition: A | Discharge status: Home / Self Care | Payer: Medicare HMO | Source: Ambulatory Visit | Attending: Gastroenterology | Admitting: Gastroenterology

## 2015-10-28 ENCOUNTER — Ambulatory Visit (HOSPITAL_COMMUNITY): Payer: Medicare HMO | Admitting: Anesthesiology

## 2015-10-28 ENCOUNTER — Encounter (HOSPITAL_COMMUNITY): Payer: Self-pay | Admitting: *Deleted

## 2015-10-28 ENCOUNTER — Encounter (HOSPITAL_COMMUNITY)
Admission: RE | Disposition: A | Discharge status: Home / Self Care | Payer: Self-pay | Source: Ambulatory Visit | Attending: Gastroenterology

## 2015-10-28 DIAGNOSIS — J449 Chronic obstructive pulmonary disease, unspecified: Secondary | ICD-10-CM | POA: Diagnosis not present

## 2015-10-28 DIAGNOSIS — I472 Ventricular tachycardia: Secondary | ICD-10-CM | POA: Diagnosis not present

## 2015-10-28 DIAGNOSIS — Z87891 Personal history of nicotine dependence: Secondary | ICD-10-CM | POA: Insufficient documentation

## 2015-10-28 DIAGNOSIS — D509 Iron deficiency anemia, unspecified: Secondary | ICD-10-CM | POA: Diagnosis not present

## 2015-10-28 DIAGNOSIS — Z9079 Acquired absence of other genital organ(s): Secondary | ICD-10-CM | POA: Diagnosis not present

## 2015-10-28 DIAGNOSIS — Z79899 Other long term (current) drug therapy: Secondary | ICD-10-CM | POA: Insufficient documentation

## 2015-10-28 DIAGNOSIS — Z7902 Long term (current) use of antithrombotics/antiplatelets: Secondary | ICD-10-CM | POA: Insufficient documentation

## 2015-10-28 DIAGNOSIS — G4733 Obstructive sleep apnea (adult) (pediatric): Secondary | ICD-10-CM | POA: Diagnosis not present

## 2015-10-28 DIAGNOSIS — E669 Obesity, unspecified: Secondary | ICD-10-CM | POA: Diagnosis not present

## 2015-10-28 DIAGNOSIS — K529 Noninfective gastroenteritis and colitis, unspecified: Secondary | ICD-10-CM | POA: Diagnosis not present

## 2015-10-28 DIAGNOSIS — I255 Ischemic cardiomyopathy: Secondary | ICD-10-CM | POA: Diagnosis not present

## 2015-10-28 DIAGNOSIS — Z8546 Personal history of malignant neoplasm of prostate: Secondary | ICD-10-CM | POA: Insufficient documentation

## 2015-10-28 DIAGNOSIS — I2510 Atherosclerotic heart disease of native coronary artery without angina pectoris: Secondary | ICD-10-CM | POA: Insufficient documentation

## 2015-10-28 DIAGNOSIS — I110 Hypertensive heart disease with heart failure: Secondary | ICD-10-CM | POA: Diagnosis not present

## 2015-10-28 DIAGNOSIS — E785 Hyperlipidemia, unspecified: Secondary | ICD-10-CM | POA: Diagnosis not present

## 2015-10-28 DIAGNOSIS — Z951 Presence of aortocoronary bypass graft: Secondary | ICD-10-CM | POA: Diagnosis not present

## 2015-10-28 DIAGNOSIS — I252 Old myocardial infarction: Secondary | ICD-10-CM | POA: Diagnosis not present

## 2015-10-28 DIAGNOSIS — I509 Heart failure, unspecified: Secondary | ICD-10-CM | POA: Diagnosis not present

## 2015-10-28 DIAGNOSIS — Z6831 Body mass index (BMI) 31.0-31.9, adult: Secondary | ICD-10-CM | POA: Insufficient documentation

## 2015-10-28 DIAGNOSIS — K219 Gastro-esophageal reflux disease without esophagitis: Secondary | ICD-10-CM | POA: Diagnosis not present

## 2015-10-28 HISTORY — DX: Personal history of other medical treatment: Z92.89

## 2015-10-28 HISTORY — DX: Malignant (primary) neoplasm, unspecified: C80.1

## 2015-10-28 HISTORY — DX: Gastro-esophageal reflux disease without esophagitis: K21.9

## 2015-10-28 HISTORY — DX: Acute myocardial infarction, unspecified: I21.9

## 2015-10-28 HISTORY — DX: Cardiac arrhythmia, unspecified: I49.9

## 2015-10-28 HISTORY — PX: ESOPHAGOGASTRODUODENOSCOPY (EGD) WITH PROPOFOL: SHX5813

## 2015-10-28 HISTORY — DX: Essential (primary) hypertension: I10

## 2015-10-28 HISTORY — DX: Chronic obstructive pulmonary disease, unspecified: J44.9

## 2015-10-28 HISTORY — PX: COLONOSCOPY WITH PROPOFOL: SHX5780

## 2015-10-28 LAB — POCT I-STAT 4, (NA,K, GLUC, HGB,HCT)
Glucose, Bld: 108 mg/dL — ABNORMAL HIGH (ref 65–99)
HEMATOCRIT: 35 % — AB (ref 39.0–52.0)
HEMOGLOBIN: 11.9 g/dL — AB (ref 13.0–17.0)
Potassium: 4.6 mmol/L (ref 3.5–5.1)
Sodium: 137 mmol/L (ref 135–145)

## 2015-10-28 SURGERY — ESOPHAGOGASTRODUODENOSCOPY (EGD) WITH PROPOFOL
Anesthesia: Monitor Anesthesia Care

## 2015-10-28 MED ORDER — EPHEDRINE SULFATE 50 MG/ML IJ SOLN
INTRAMUSCULAR | Status: AC
  Filled 2015-10-28: qty 2

## 2015-10-28 MED ORDER — PHENYLEPHRINE HCL 10 MG/ML IJ SOLN
INTRAMUSCULAR | Status: AC
  Filled 2015-10-28: qty 1

## 2015-10-28 MED ORDER — PROPOFOL 10 MG/ML IV BOLUS
INTRAVENOUS | Status: AC
  Filled 2015-10-28: qty 40

## 2015-10-28 MED ORDER — PROPOFOL 10 MG/ML IV BOLUS
INTRAVENOUS | Status: DC | PRN
  Administered 2015-10-28: 20 mg via INTRAVENOUS
  Administered 2015-10-28 (×5): 40 mg via INTRAVENOUS
  Administered 2015-10-28 (×2): 20 mg via INTRAVENOUS
  Administered 2015-10-28: 40 mg via INTRAVENOUS
  Administered 2015-10-28: 20 mg via INTRAVENOUS
  Administered 2015-10-28: 40 mg via INTRAVENOUS

## 2015-10-28 MED ORDER — SUGAMMADEX SODIUM 200 MG/2ML IV SOLN
INTRAVENOUS | Status: AC
  Filled 2015-10-28: qty 4

## 2015-10-28 MED ORDER — ONDANSETRON HCL 4 MG/2ML IJ SOLN
INTRAMUSCULAR | Status: AC
  Filled 2015-10-28: qty 2

## 2015-10-28 MED ORDER — SODIUM CHLORIDE 0.9 % IV SOLN: INTRAVENOUS | Status: DC

## 2015-10-28 MED ORDER — LACTATED RINGERS IV SOLN
INTRAVENOUS | Status: DC
  Administered 2015-10-28: 1000 mL via INTRAVENOUS

## 2015-10-28 MED ORDER — SODIUM CHLORIDE 0.9 % IJ SOLN
INTRAMUSCULAR | Status: AC
  Filled 2015-10-28: qty 20

## 2015-10-28 SURGICAL SUPPLY — 25 items

## 2015-10-28 NOTE — H&P (Signed)
Jay [name] HPI: The patient was noted to have a drop in his HGB from 12 to 11 and now down to 9.9 g/dL over the past 8 months.  His colonoscopy in 2009 was significant for a mild NSAID-induced ileitis.  This was identified on routine testing and he denies any prior history of an anemia.  He was evaluated by Hematology, but a source cannot be identified.  The patient was started on Plavix in 2009 shortly after his CABG.  Past Medical History  Diagnosis Date  . S/P CABG x 4 2007    Dr.  [name]  . Coronary artery disease   . Obstructive sleep apnea on CPAP   . Hyperlipidemia   . Psoriasis   . Arthritis   . Ischemic cardiomyopathy     Ejection fraction 40-45%  . Tobacco abuse   . Obesity, Class II, BMI 35-39.9   . Hypertension   . COPD (chronic obstructive pulmonary disease) ([place])   . Diabetes mellitus without complication ([place])     borderline recently told.-no meds  . GERD (gastroesophageal reflux disease)   . Cancer [facility])     Prostate cancer- s/p Rob. prostate surgery '08  . Transfusion history     x 1  -s/p CABPG  . Myocardial infarction ([place])     x1  . Dysrhythmia     ? near syncope episodes, arhythmia -not proven cause- symptoms had stop when monitored with continuous rhythm monitor.    Past Surgical History  Procedure Laterality Date  . Coronary artery bypass graft  02/14/2006    LIMA to LAD,SVG to posterior descending,SVG to CX,SVG to ramus intermediate.  . Cardiac catheterization  07/04/2006    significant 3 vessel disease: totally vein graft to the ramus intermedius,toalled vein graft to the obtuse marginal, mildly diseased vein graft of the posterior descending artery, mildly depressed LV systolic fx.  Mechele Claude robotic assisted simple prostatectomy    . Cystoscopy      Family History  Problem Relation Age of Onset  . Heart attack Father     Social History:  reports that he has quit smoking. He does not have any smokeless tobacco history on file. He reports that  he drinks alcohol. He reports that he does not use illicit drugs.  Allergies:  Allergies  Allergen Reactions  . Lipitor [Atorvastatin]     Myalgia    Medications:  Scheduled:  Continuous: . sodium chloride    . lactated ringers 1,000 mL (10/28/15 0659)    Results for orders placed or performed during the hospital encounter of 10/28/15 (from the past 24 hour(s))  I-STAT 4, (NA,K, GLUC, HGB,HCT)     Status: Abnormal   Collection Time: 10/28/15  7:15 AM  Result Value Ref Range   Sodium 137 135 - 145 mmol/L   Potassium 4.6 3.5 - 5.1 mmol/L   Glucose, Bld 108 (H) 65 - 99 mg/dL   HCT 35.0 (L) 39.0 - 52.0 %   Hemoglobin 11.9 (L) 13.0 - 17.0 g/dL     No results found.  ROS:  As stated above in the HPI otherwise negative.  Blood pressure 126/67, pulse 72, temperature 98.2 F (36.8 C), temperature source Oral, resp. rate 12, height 5\' 9"  (1.753 m), weight 97.07 kg (214 lb), SpO2 99 %.    PE: Gen: NAD, Alert and Oriented HEENT:  [place]/AT, EOMI Neck: Supple, no LAD Lungs: CTA Bilaterally CV: RRR without M/G/R ABM: Soft, NTND, +BS Ext: No C/C/E  Assessment/Plan: 1)  IDA - EGD/Colonoscopy.  [name] D 10/28/2015, 8:13 AM

## 2015-10-28 NOTE — Anesthesia Postprocedure Evaluation (Signed)
Anesthesia Post Note  Patient: Jay [name]  Procedure(s) Performed: Procedure(s) (LRB): ESOPHAGOGASTRODUODENOSCOPY (EGD) WITH PROPOFOL (N/A) COLONOSCOPY WITH PROPOFOL (N/A)  Patient location during evaluation: PACU Anesthesia Type: MAC Level of consciousness: awake and alert Pain management: pain level controlled Vital Signs Assessment: post-procedure vital signs reviewed and stable Respiratory status: spontaneous breathing, nonlabored ventilation, respiratory function stable and patient connected to nasal cannula oxygen Cardiovascular status: blood pressure returned to baseline and stable Postop Assessment: no signs of nausea or vomiting Anesthetic complications: no    Last Vitals:  Filed Vitals:   10/28/15 0920 10/28/15 0930  BP: 120/62 130/52  Pulse: 67   Temp:    Resp: 14     Last Pain: There were no vitals filed for this visit.               Matasha Smigelski L

## 2015-10-28 NOTE — Op Note (Signed)
[facility] Patient Name: Breckon Houser Procedure Date: 10/28/2015 MRN: [id number] Attending MD: Carol Ada , MD Date of Birth: 1950/05/09 CSN: [id number] Age: 65 Admit Type: Outpatient Procedure:                Colonoscopy Indications:              Iron deficiency anemia Providers:                Carol Ada, MD, Laverta Baltimore, RN, Cherylynn Ridges, Technician, Glenis Smoker, CRNA Referring MD:              Medicines:                Propofol per Anesthesia Complications:            No immediate complications. Estimated Blood Loss:     Estimated blood loss was minimal. Procedure:                Pre-Anesthesia Assessment:                           - Prior to the procedure, a History and Physical                            was performed, and patient medications and                            allergies were reviewed. The patient's tolerance of                            previous anesthesia was also reviewed. The risks                            and benefits of the procedure and the sedation                            options and risks were discussed with the patient.                            All questions were answered, and informed consent                            was obtained. Prior Anticoagulants: The patient has                            taken Plavix (clopidogrel), last dose was 5 days                            prior to procedure. ASA Grade Assessment: III - A                            patient with severe systemic disease. After  reviewing the risks and benefits, the patient was                            deemed in satisfactory condition to undergo the                            procedure.                           - Sedation was administered by an anesthesia                            professional. Deep sedation was attained.                           After obtaining informed consent, the colonoscope                             was passed under direct vision. Throughout the                            procedure, the patient's blood pressure, pulse, and                            oxygen saturations were monitored continuously. The                            Colonoscope was introduced through the anus and                            advanced to the the terminal ileum. The colonoscopy                            was performed without difficulty. The patient                            tolerated the procedure well. The quality of the                            bowel preparation was excellent. The terminal                            ileum, ileocecal valve, appendiceal orifice, and                            rectum were photographed. Scope In: 8:29:10 AM Scope Out: 8:47:47 AM Scope Withdrawal Time: 0 hours 13 minutes 44 seconds  Total Procedure Duration: 0 hours 18 minutes 37 seconds  Findings:      Scattered inflammation, moderate in severity and characterized by       erythema, friability, granularity and shallow ulcerations was found in       the terminal ileum. Biopsies were taken with a cold forceps for       histology.      The entire examined colon appeared normal.      Approximately  10-15 cm of the terminal ileum exhibited ulcerations. This       may be secondary to NSAIDs versus Crohn's disease. Impression:               - Ileitis. Biopsied.                           - The entire examined colon is normal. Moderate Sedation:      N/A- Per Anesthesia Care Recommendation:           - Patient has a contact number available for                            emergencies. The signs and symptoms of potential                            delayed complications were discussed with the                            patient. Return to normal activities tomorrow.                            Written discharge instructions were provided to the                            patient.                           -  Resume previous diet.                           - Continue present medications.                           - Await pathology results.                           - Repeat colonoscopy in 10 years for surveillance. Procedure Code(s):        --- Professional ---                           [phone], Colonoscopy, flexible; with biopsy, single                            or multiple Diagnosis Code(s):        --- Professional ---                           K52.9, Noninfective gastroenteritis and colitis,                            unspecified                           D50.9, Iron deficiency anemia, unspecified CPT copyright 2016 [employer]. All rights reserved. The codes documented in this report are preliminary and upon coder review may  be revised to meet current compliance requirements. Carol Ada, MD Saralyn Pilar  Benson Norway, MD 10/28/2015 8:54:59 AM This report has been signed electronically. Number of Addenda: 0

## 2015-10-28 NOTE — Op Note (Signed)
[facility] Patient Name: Jay [name] Procedure Date: 10/28/2015 MRN: [id number] Attending MD: Carol Ada , MD Date of Birth: Mar 23, 1951 CSN: [id number] Age: 65 Admit Type: Outpatient Procedure:                Upper GI endoscopy Indications:              Iron deficiency anemia Providers:                Carol Ada, MD, Laverta Baltimore, RN, Janie                            Billups, Technician, Glenis Smoker, CRNA Referring MD:              Medicines:                Propofol per Anesthesia Complications:            No immediate complications. Estimated Blood Loss:     Estimated blood loss was minimal. Procedure:                Pre-Anesthesia Assessment:                           - Prior to the procedure, a History and Physical                            was performed, and patient medications and                            allergies were reviewed. The patient's tolerance of                            previous anesthesia was also reviewed. The risks                            and benefits of the procedure and the sedation                            options and risks were discussed with the patient.                            All questions were answered, and informed consent                            was obtained. Prior Anticoagulants: The patient has                            taken Plavix (clopidogrel), last dose was 5 days                            prior to procedure. ASA Grade Assessment: III - A                            patient with severe systemic disease. After  reviewing the risks and benefits, the patient was                            deemed in satisfactory condition to undergo the                            procedure.                           After obtaining informed consent, the endoscope was                            passed under direct vision. Throughout the                            procedure, the patient's blood  pressure, pulse, and                            oxygen saturations were monitored continuously. The                            Endosonoscope was introduced through the mouth, and                            advanced to the third part of duodenum. The                            Endosonoscope was introduced through the and                            advanced to the. The upper GI endoscopy was                            accomplished without difficulty. The patient                            tolerated the procedure well. Findings:      The esophagus was normal.      The stomach was normal.      The examined duodenum was normal.      With the findings of the ileitis, which was the source of bleeding, the       proximal small bowel biopsies were discarded. Impression:               - Normal esophagus.                           - Normal stomach.                           - Normal examined duodenum.                           - No specimens collected. Moderate Sedation:      N/A- Per Anesthesia Care Recommendation:           - Patient has a contact number available for  emergencies. The signs and symptoms of potential                            delayed complications were discussed with the                            patient. Return to normal activities tomorrow.                            Written discharge instructions were provided to the                            patient.                           - Resume previous diet.                           - Continue present medications. Procedure Code(s):        --- Professional ---                           [phone], Esophagogastroduodenoscopy, flexible,                            transoral; diagnostic, including collection of                            specimen(s) by brushing or washing, when performed                            (separate procedure) Diagnosis Code(s):        --- Professional ---                            D50.9, Iron deficiency anemia, unspecified CPT copyright 2016 American Medical Association. All rights reserved. The codes documented in this report are preliminary and upon coder review may  be revised to meet current compliance requirements. Carol Ada, MD Carol Ada, MD 10/28/2015 8:57:18 AM This report has been signed electronically. Number of Addenda: 0

## 2015-10-28 NOTE — Transfer of Care (Signed)
Immediate Anesthesia Transfer of Care Note  Patient: Jay [name]  Procedure(s) Performed: Procedure(s): ESOPHAGOGASTRODUODENOSCOPY (EGD) WITH PROPOFOL (N/A) COLONOSCOPY WITH PROPOFOL (N/A)  Patient Location: PACU  Anesthesia Type:MAC  Level of Consciousness: awake  Airway & Oxygen Therapy: Patient Spontanous Breathing and Patient connected to nasal cannula oxygen  Post-op Assessment: Report given to RN and Post -op Vital signs reviewed and stable  Post vital signs: Reviewed and stable  Last Vitals:  Filed Vitals:   10/28/15 0657  BP: 126/67  Pulse: 72  Temp: 36.8 C  Resp: 12    Last Pain: There were no vitals filed for this visit.       Complications: No apparent anesthesia complications

## 2015-10-30 ENCOUNTER — Encounter (HOSPITAL_COMMUNITY): Payer: Self-pay | Admitting: Gastroenterology

## 2015-11-28 ENCOUNTER — Telehealth: Payer: Self-pay | Admitting: Cardiovascular Disease

## 2015-11-28 NOTE — Telephone Encounter (Signed)
Received records from [facility] for appointment on 8/181/17 with Dr Sallyanne Kuster.  Records given to [facility] records for Dr Croitoru's schedule on 12/16/15. lp

## 2015-12-06 ENCOUNTER — Telehealth: Payer: Self-pay | Admitting: Cardiovascular Disease

## 2015-12-06 NOTE — Telephone Encounter (Signed)
Received records from [facility] for appointment on 12/16/15 with Dr Sallyanne Kuster.  Records given to [facility] (medical records) for Dr Croitoru's schedule on 12/16/15. lp

## 2015-12-12 ENCOUNTER — Other Ambulatory Visit: Payer: Self-pay | Admitting: Cardiovascular Disease

## 2015-12-16 ENCOUNTER — Encounter: Payer: Self-pay | Admitting: Cardiovascular Disease

## 2015-12-16 ENCOUNTER — Ambulatory Visit (INDEPENDENT_AMBULATORY_CARE_PROVIDER_SITE_OTHER): Payer: Medicare HMO | Admitting: Cardiovascular Disease

## 2015-12-16 VITALS — BP 99/54 | HR 78 | Ht 69.0 in | Wt 220.8 lb

## 2015-12-16 DIAGNOSIS — I5042 Chronic combined systolic (congestive) and diastolic (congestive) heart failure: Secondary | ICD-10-CM | POA: Diagnosis not present

## 2015-12-16 DIAGNOSIS — E785 Hyperlipidemia, unspecified: Secondary | ICD-10-CM

## 2015-12-16 DIAGNOSIS — E669 Obesity, unspecified: Secondary | ICD-10-CM

## 2015-12-16 DIAGNOSIS — J438 Other emphysema: Secondary | ICD-10-CM

## 2015-12-16 DIAGNOSIS — K5000 Crohn's disease of small intestine without complications: Secondary | ICD-10-CM

## 2015-12-16 DIAGNOSIS — I472 Ventricular tachycardia: Secondary | ICD-10-CM | POA: Diagnosis not present

## 2015-12-16 DIAGNOSIS — I25709 Atherosclerosis of coronary artery bypass graft(s), unspecified, with unspecified angina pectoris: Secondary | ICD-10-CM

## 2015-12-16 DIAGNOSIS — G4730 Sleep apnea, unspecified: Secondary | ICD-10-CM

## 2015-12-16 DIAGNOSIS — I4729 Other ventricular tachycardia: Secondary | ICD-10-CM

## 2015-12-16 NOTE — Patient Instructions (Signed)
Medication Instructions:  Your physician recommends that you continue on your current medications as directed. Please refer to the Current Medication list given to you today.  Labwork: NON ORDERED  Testing/Procedures: NONE ORDERED  Follow-Up: Your physician wants you to follow-up in: [place]. You will receive a reminder letter in the mail two months in advance. If you don't receive a letter, please call our office to schedule the follow-up appointment.  Any Other Special Instructions Will Be Listed Below (If Applicable).     If you need a refill on your cardiac medications before your next appointment, please call your pharmacy.

## 2015-12-16 NOTE — Progress Notes (Signed)
Cardiology Office Note    Date:  12/16/2015   ID:  [name], DOB [birth year], MRN [id number]  PCP:  [name], MD  Cardiologist:   [name], MD   No chief complaint: CHF and CAD follow up   History of Present Illness:  Jay [name] is a 65 y.o. male with coronary artery disease, chronic systolic and diastolic heart failure, history of near-syncope related to nonsustained ventricular tachycardia, severe chronic lung disease returning for follow-up.  He has generally done well over the last year and has not had any problems with angina pectoris or shortness of breath until a couple of weeks ago. He abruptly developed a 15 pound weight gain, severe ankle edema and worsening shortness of breath with activity. He does not recall missing medications or any particularly serious dietary sodium indiscretions. He doubled his dose of furosemide for a few days and took metolazone and within 3 days was back to normal. He increased his potassium dose when he was taking the extra diuretics He has been conscientiously following his weight both before and after these events.   He is back to his usual pattern of moderate to severe exertional dyspnea related to chronic lung disease. He denies palpitations, near syncope or syncope, claudication or focal neurological complaints. He has been working in his yard and does not feel that there has been a real change in his chronic stamina. Labs performed on July 7 show potassium of 5.1 and a creatinine of 1.2, sodium 138.   Hemoglobin on June 30 was 11.9, reportedly his hemoglobin had been down to 9.9 earlier in the year. A couple of months ago he underwent upper and lower endoscopy for worsening anemia, performed by Dr. Benson Norway. No abnormalities were seen in the stomach or esophagus or large intestine,but he was found to have terminal ileitis with ulcerations, possibly related to NSAID versus Crohn's disease use. Biopsy confirmed active  ileitis but did not show any granulomas. He has psoriasis and psoriatic arthritis. Dr. Benson Norway suggested possible treatment with a biological doubt cover both the arthritis and any possible component of Crohn's.  He has a history of coronary artery disease and underwent multivessel bypass surgery in 2007 (LIMA to LAD, SVG to PDA, SVG to OM, SVG to ramus intermedius, [name]) and underwent repeat cardiac catheterization in 2008: He had interim total occlusion of the vein graft to the OM and ramus intermedius, patent but mildly diseased SVG to PDA and a patent LIMA to the LAD. At that time his left ventricular ejection fraction was 40-45% and he had evidence of elevated left ventricular end-diastolic pressure. Most recent echo shows an ejection fraction of around 40% in my review. He has obstructive sleep apnea but is poorly compliant with CPAP. He has well treated hypercholesterolemia with mild residual hypertriglyceridemia and a low HDL.  Past Medical History:  Diagnosis Date  . Arthritis   . Cancer [facility])    Prostate cancer- s/p Rob. prostate surgery '08  . COPD (chronic obstructive pulmonary disease) ([place])   . Coronary artery disease   . Diabetes mellitus without complication ([place])    borderline recently told.-no meds  . Dysrhythmia    ? near syncope episodes, arhythmia -not proven cause- symptoms had stop when monitored with continuous rhythm monitor.  [name] GERD (gastroesophageal reflux disease)   . Hyperlipidemia   . Hypertension   . Ischemic cardiomyopathy    Ejection fraction 40-45%  . Myocardial infarction ([place])    x1  .  Obesity, Class II, BMI 35-39.9   . Obstructive sleep apnea on CPAP   . Psoriasis   . S/P CABG x 4 2007   Dr.  [name]  . Tobacco abuse   . Transfusion history    x 1  -s/p CABPG    Past Surgical History:  Procedure Laterality Date  . CARDIAC CATHETERIZATION  07/04/2006   significant 3 vessel disease: totally vein graft to the ramus intermedius,toalled vein graft  to the obtuse marginal, mildly diseased vein graft of the posterior descending artery, mildly depressed LV systolic fx.  . COLONOSCOPY WITH PROPOFOL N/A 10/28/2015   Procedure: COLONOSCOPY WITH PROPOFOL;  Surgeon: Carol Ada, MD;  Location: [facility];  Service: Endoscopy;  Laterality: N/A;  . CORONARY ARTERY BYPASS GRAFT  02/14/2006   LIMA to LAD,SVG to posterior descending,SVG to CX,SVG to ramus intermediate.  . CYSTOSCOPY    . ESOPHAGOGASTRODUODENOSCOPY (EGD) WITH PROPOFOL N/A 10/28/2015   Procedure: ESOPHAGOGASTRODUODENOSCOPY (EGD) WITH PROPOFOL;  Surgeon: Carol Ada, MD;  Location: [facility];  Service: Endoscopy;  Laterality: N/A;  . XI ROBOTIC ASSISTED SIMPLE PROSTATECTOMY      Current Medications: Outpatient Medications Prior to Visit  Medication Sig Dispense Refill  . albuterol (PROAIR HFA) 108 (90 BASE) MCG/ACT inhaler Inhale 1 puff into the lungs daily.    [name] atorvastatin (LIPITOR) 20 MG tablet Take 1 tablet by mouth daily.    . candesartan (ATACAND) 8 MG tablet Take 8 mg by mouth daily.    . clopidogrel (PLAVIX) 75 MG tablet Take 75 mg by mouth daily.    . Cyanocobalamin (VITAMIN B 12 PO) Take 1 tablet by mouth daily.    [name] esomeprazole (NEXIUM) 40 MG capsule Take 40 mg by mouth daily before breakfast.    . Fluticasone-Salmeterol (ADVAIR) 250-50 MCG/DOSE AEPB Inhale 1 puff into the lungs daily.    . metolazone (ZAROXOLYN) 2.5 MG tablet take 1 tablet by mouth 30 MINUTES PRIOR TO FUROSEMIDE IF WEIGHT IS GREATER THAN 240 LBS 30 tablet 5  . metoprolol succinate (TOPROL-XL) 50 MG 24 hr tablet Take 1 tablet (50 mg total) by mouth daily. Take with or immediately following a meal. 90 tablet 2  . montelukast (SINGULAIR) 10 MG tablet Take 10 mg by mouth at bedtime.    . potassium chloride (KLOR-CON M10) 10 MEQ tablet Take 2 tablets (20 mEq total) by mouth 2 (two) times daily. 360 tablet 3  . tiotropium (SPIRIVA HANDIHALER) 18 MCG inhalation capsule Place 1 Inhaler into inhaler and  inhale daily.    . Urea 50 % CREA Apply 1 application topically 3 (three) times daily.  0  . furosemide (LASIX) 20 MG tablet TAKE 2 TABLETS DAILY 180 tablet 0  . ferrous sulfate 325 (65 FE) MG tablet Take 325 mg by mouth daily with breakfast.     No facility-administered medications prior to visit.      Allergies:   Lipitor [atorvastatin] and Angiotensin receptor blockers   Social History   Social History  . Marital status: Married    Spouse name: N/A  . Number of children: N/A  . Years of education: N/A   Social History Main Topics  . Smoking status: Former Research scientist (life sciences)  . Smokeless tobacco: None  . Alcohol use 0.0 oz/week     Comment: occ. to rare  . Drug use: No  . Sexual activity: Not Asked   Other Topics Concern  . None   Social History Narrative  . None     Family History:  The patient's family history includes Heart attack in his father.   ROS:   Please see the history of present illness.    ROS All other systems reviewed and are negative.   PHYSICAL EXAM:   VS:  BP (!) 99/54   Pulse 78   Ht 5\' 9"  (1.753 m)   Wt 220 lb 12.8 oz (100.2 kg)   BMI 32.61 kg/m    GEN: Well nourished, well developed, in no acute distress  HEENT: normal  Neck: no JVD, carotid bruits, or masses Cardiac: RRR; no murmurs, rubs, or gallops,no edema  Respiratory:  clear to auscultation bilaterally, normal work of breathing GI: soft, nontender, nondistended, + BS MS: no deformity or atrophy  Skin: warm and dry, no rash Neuro:  Alert and Oriented x 3, Strength and sensation are intact Psych: euthymic mood, full affect  Wt Readings from Last 3 Encounters:  12/16/15 220 lb 12.8 oz (100.2 kg)  10/28/15 214 lb (97.1 kg)  12/13/14 253 lb 4.8 oz (114.9 kg)      Studies/Labs Reviewed:   EKG:  EKG is ordered today.  The ekg ordered today demonstrates Sinus rhythm with PVCs in a pattern of trigeminy, incomplete right bundle branch block, inferior and anterolateral ST segment depression and  T-wave inversion, QTC 428 ms. The ECG similar with previous tracings.  Recent Labs: 10/28/2015: Hemoglobin 11.9; Potassium 4.6; Sodium 137   Lipid Panel    Component Value Date/Time   CHOL 108 01/25/2014 0829   TRIG 211 (H) 01/25/2014 0829   HDL 34 (L) 01/25/2014 0829   CHOLHDL 3.2 01/25/2014 0829   VLDL 42 (H) 01/25/2014 0829   LDLCALC 32 01/25/2014 0829    Additional studies/ records that were reviewed today include:  Notes from Dr. Carol Ada    ASSESSMENT:    1. Chronic combined systolic and diastolic heart failure, NYHA class 2 ([place])   2. NSVT (nonsustained ventricular tachycardia) ([place])   3. Coronary artery disease involving coronary bypass graft of native heart with unspecified angina pectoris   4. Other emphysema ([place])   5. Hyperlipidemia   6. Sleep apnea   7. Mild obesity      PLAN:  In order of problems listed above:  1. CHF: He had a recent episode of acute exacerbation that he successfully treated by himself with increased doses of diuretics and potassium supplements. Today he appears euvolemic and has good functional status. He is essentially back to his baseline. His blood pressure is borderline low but he denies dizziness or lightheadedness. He is on appropriate treatment with beta blockers and maximum tolerated dose, angiotensin receptor blocker and diuretics. I have asked him to call should he need to take boosted doses of diuretics for a period longer than about 2 or 3 days so that we may check his potassium and magnesium levels and renal function. Most recent estimated EF 40%. 2. NSVT: No episodes of presyncope or syncope. Frequent PVCs on today's tracing. Continue beta blocker. Also beta blocker limited by COPD. 3. CAD: No angina pectoris on current medical regimen 4. COPD: Unchanged 5. HLP: Reports compliance with statin, need to get updated labs from his primary care provider 6. OSA: Not compliant with CPAP 7. Advised to continue efforts at weight  loss. 8. Not due to Crohn's disease but presumably secondary to NSAIDs. He is advised to avoid nonsteroidal anti-inflammatory drugs due to this diagnosis as well as due to the risk of heart failure    Medication Adjustments/Labs and Tests  Ordered: Current medicines are reviewed at length with the patient today.  Concerns regarding medicines are outlined above.  Medication changes, Labs and Tests ordered today are listed in the Patient Instructions below. Patient Instructions  Medication Instructions:  Your physician recommends that you continue on your current medications as directed. Please refer to the Current Medication list given to you today.  Labwork: NON ORDERED  Testing/Procedures: NONE ORDERED  Follow-Up: Your physician wants you to follow-up in: [place]. You will receive a reminder letter in the mail two months in advance. If you don't receive a letter, please call our office to schedule the follow-up appointment.  Any Other Special Instructions Will Be Listed Below (If Applicable).     If you need a refill on your cardiac medications before your next appointment, please call your pharmacy.     Signed, [name], MD  12/16/2015 1:41 PM    [facility] [place], [place], [place]  [address] Phone: [phone]; Fax: [phone]

## 2015-12-26 ENCOUNTER — Other Ambulatory Visit (HOSPITAL_COMMUNITY): Payer: Self-pay | Admitting: Internal Medicine

## 2015-12-26 NOTE — Telephone Encounter (Signed)
Rx request sent to pharmacy.  

## 2016-01-05 ENCOUNTER — Telehealth: Payer: Self-pay | Admitting: Cardiovascular Disease

## 2016-01-05 NOTE — Telephone Encounter (Signed)
It's okay for him to stop taking the Plavix, at least temporarily until the bleeding resolves.

## 2016-01-05 NOTE — Telephone Encounter (Signed)
Spoke with patient and he has recently been diagnosed with Chron's Disease  He has been having a lot of bleeding with his bowel movements especially on Monday. Is now feeling dizzy and tired Saw Dr Benson Norway his gastroenterologist this am and had labs done, not resulted yet Patient concerned since he is taking Plavix, he has taken today Will forward to Dr Sallyanne Kuster for review

## 2016-01-05 NOTE — Telephone Encounter (Signed)
Advised patient, verbalized understanding  

## 2016-01-05 NOTE — Telephone Encounter (Signed)
New message    Pt c/o medication issue:  1. Name of Medication: plavix   2. How are you currently taking this medication (dosage and times per day)? 75 mg once a day   3. Are you having a reaction (difficulty breathing--STAT)? Starting on Monday x 6 -8 times   4. What is your medication issue? Please advise - just dx with cohn disease.

## 2016-02-14 ENCOUNTER — Encounter (HOSPITAL_COMMUNITY): Payer: Self-pay | Admitting: *Deleted

## 2016-02-16 NOTE — H&P (Signed)
[name] HPI: This is a 65 year old male with Crohn's disease of the TI.  He continues to have bleeding despite being on treatment.  He required a 3 unit PRBC at [facility] recently and he also had an iron infusion.  The patient was discontinued from Plavix several weeks ago as a result of the bleeding.  Past Medical History:  Diagnosis Date  . Arthritis   . Borderline diabetes   . Cancer [facility]) 2008   Prostate cancer- s/p Rob. prostate surgery '08  . COPD (chronic obstructive pulmonary disease) ([place])   . Coronary artery disease   . Crohn's disease ([place])   . Dysrhythmia    ? near syncope episodes, arhythmia -not proven cause- symptoms had stop when monitored with continuous rhythm monitor.  [name] GERD (gastroesophageal reflux disease)   . History of home oxygen therapy    2 liters prn and at hs  . Hyperlipidemia   . Hypertension   . Ischemic cardiomyopathy    Ejection fraction 40-45%  . Myocardial infarction 2008   x1  . Obesity, Class II, BMI 35-39.9   . Obstructive sleep apnea on CPAP    uses cpap setting of 10  . Psoriasis   . S/P CABG x 4 2007   Dr.  [name]   . Tobacco abuse   . Transfusion history    x 1  -s/p CABPG    Past Surgical History:  Procedure Laterality Date  . CARDIAC CATHETERIZATION  07/04/2006   significant 3 vessel disease: totally vein graft to the ramus intermedius,toalled vein graft to the obtuse marginal, mildly diseased vein graft of the posterior descending artery, mildly depressed LV systolic fx.  . COLONOSCOPY WITH PROPOFOL N/A 10/28/2015   Procedure: COLONOSCOPY WITH PROPOFOL;  Surgeon: Carol Ada, MD;  [facility];  Service: Endoscopy;  Laterality: N/A;  . CORONARY ARTERY BYPASS GRAFT  02/14/2006   LIMA to LAD,SVG to posterior descending,SVG to CX,SVG to ramus intermediate.  . CYSTOSCOPY    . ESOPHAGOGASTRODUODENOSCOPY (EGD) WITH PROPOFOL N/A 10/28/2015   Procedure: ESOPHAGOGASTRODUODENOSCOPY (EGD) WITH PROPOFOL;   Surgeon: Carol Ada, MD;  [facility];  Service: Endoscopy;  Laterality: N/A;  . XI ROBOTIC ASSISTED SIMPLE PROSTATECTOMY      Family History  Problem Relation Age of Onset  . Heart attack Father     Social History:  reports that he quit smoking about 15 years ago. His smoking use included Cigarettes. He has a 60.00 pack-year smoking history. He has never used smokeless tobacco. He reports that he drinks alcohol. He reports that he does not use drugs.  Allergies:  Allergies  Allergen Reactions  . Lipitor [Atorvastatin] Other (See Comments)    Myalgia Myalgia  . Angiotensin Receptor Blockers Other (See Comments)    Hyperkalemia    Medications: Scheduled: Continuous:  No results found for this or any previous visit (from the past 24 hour(s)).   No results found.  ROS:  As stated above in the HPI otherwise negative.  There were no vitals taken for this visit.    PE: Gen: NAD, Alert and Oriented HEENT:  [place]/AT, EOMI Neck: Supple, no LAD Lungs: CTA Bilaterally CV: RRR without M/G/R ABM: Soft, NTND, +BS Ext: No C/C/E  Assessment/Plan: 1) Crohn's disease - reevaluate of his TI and colon is necessary to discern if he requires more aggressive treatment.  Anely Spiewak D 02/16/2016, 1:15 PM

## 2016-02-17 ENCOUNTER — Ambulatory Visit (HOSPITAL_COMMUNITY): Admit: 2016-02-17 | Payer: Medicare HMO | Admitting: Gastroenterology

## 2016-02-17 ENCOUNTER — Ambulatory Visit (HOSPITAL_COMMUNITY): Payer: Medicare HMO | Admitting: Anesthesiology

## 2016-02-17 ENCOUNTER — Ambulatory Visit (HOSPITAL_COMMUNITY)
Admission: RE | Admit: 2016-02-17 | Discharge: 2016-02-17 | Disposition: A | Discharge status: Home / Self Care | Payer: Medicare HMO | Source: Ambulatory Visit | Attending: Gastroenterology | Admitting: Gastroenterology

## 2016-02-17 ENCOUNTER — Encounter (HOSPITAL_COMMUNITY): Payer: Self-pay

## 2016-02-17 ENCOUNTER — Encounter (HOSPITAL_COMMUNITY): Payer: Self-pay | Admitting: *Deleted

## 2016-02-17 ENCOUNTER — Encounter (HOSPITAL_COMMUNITY)
Admission: RE | Disposition: A | Discharge status: Home / Self Care | Payer: Self-pay | Source: Ambulatory Visit | Attending: Gastroenterology

## 2016-02-17 DIAGNOSIS — D509 Iron deficiency anemia, unspecified: Secondary | ICD-10-CM | POA: Diagnosis not present

## 2016-02-17 DIAGNOSIS — K219 Gastro-esophageal reflux disease without esophagitis: Secondary | ICD-10-CM | POA: Diagnosis not present

## 2016-02-17 DIAGNOSIS — M1990 Unspecified osteoarthritis, unspecified site: Secondary | ICD-10-CM | POA: Insufficient documentation

## 2016-02-17 DIAGNOSIS — K50011 Crohn's disease of small intestine with rectal bleeding: Secondary | ICD-10-CM | POA: Diagnosis not present

## 2016-02-17 DIAGNOSIS — Z951 Presence of aortocoronary bypass graft: Secondary | ICD-10-CM | POA: Insufficient documentation

## 2016-02-17 DIAGNOSIS — E669 Obesity, unspecified: Secondary | ICD-10-CM | POA: Diagnosis not present

## 2016-02-17 DIAGNOSIS — I252 Old myocardial infarction: Secondary | ICD-10-CM | POA: Diagnosis not present

## 2016-02-17 DIAGNOSIS — R7303 Prediabetes: Secondary | ICD-10-CM | POA: Diagnosis not present

## 2016-02-17 DIAGNOSIS — Z9981 Dependence on supplemental oxygen: Secondary | ICD-10-CM | POA: Insufficient documentation

## 2016-02-17 DIAGNOSIS — G4733 Obstructive sleep apnea (adult) (pediatric): Secondary | ICD-10-CM | POA: Diagnosis not present

## 2016-02-17 DIAGNOSIS — Z8546 Personal history of malignant neoplasm of prostate: Secondary | ICD-10-CM | POA: Diagnosis not present

## 2016-02-17 DIAGNOSIS — I2510 Atherosclerotic heart disease of native coronary artery without angina pectoris: Secondary | ICD-10-CM | POA: Insufficient documentation

## 2016-02-17 DIAGNOSIS — J449 Chronic obstructive pulmonary disease, unspecified: Secondary | ICD-10-CM | POA: Insufficient documentation

## 2016-02-17 DIAGNOSIS — I255 Ischemic cardiomyopathy: Secondary | ICD-10-CM | POA: Insufficient documentation

## 2016-02-17 DIAGNOSIS — K921 Melena: Secondary | ICD-10-CM | POA: Diagnosis present

## 2016-02-17 DIAGNOSIS — Z6832 Body mass index (BMI) 32.0-32.9, adult: Secondary | ICD-10-CM | POA: Insufficient documentation

## 2016-02-17 DIAGNOSIS — I10 Essential (primary) hypertension: Secondary | ICD-10-CM | POA: Insufficient documentation

## 2016-02-17 DIAGNOSIS — E785 Hyperlipidemia, unspecified: Secondary | ICD-10-CM | POA: Diagnosis not present

## 2016-02-17 DIAGNOSIS — Z87891 Personal history of nicotine dependence: Secondary | ICD-10-CM | POA: Diagnosis not present

## 2016-02-17 DIAGNOSIS — Z7902 Long term (current) use of antithrombotics/antiplatelets: Secondary | ICD-10-CM | POA: Diagnosis not present

## 2016-02-17 HISTORY — PX: COLONOSCOPY WITH PROPOFOL: SHX5780

## 2016-02-17 HISTORY — DX: Crohn's disease, unspecified, without complications: K50.90

## 2016-02-17 HISTORY — DX: Prediabetes: R73.03

## 2016-02-17 HISTORY — DX: Dependence on supplemental oxygen: Z99.81

## 2016-02-17 SURGERY — COLONOSCOPY WITH PROPOFOL
Anesthesia: Monitor Anesthesia Care

## 2016-02-17 MED ORDER — SODIUM CHLORIDE 0.9 % IV SOLN: INTRAVENOUS | Status: DC

## 2016-02-17 MED ORDER — LIDOCAINE 2% (20 MG/ML) 5 ML SYRINGE
INTRAMUSCULAR | Status: DC | PRN
  Administered 2016-02-17: 50 mg via INTRAVENOUS

## 2016-02-17 MED ORDER — LACTATED RINGERS IV SOLN
INTRAVENOUS | Status: DC
  Administered 2016-02-17: 09:00:00 via INTRAVENOUS

## 2016-02-17 MED ORDER — PROPOFOL 10 MG/ML IV BOLUS
INTRAVENOUS | Status: DC | PRN
  Administered 2016-02-17: 20 mg via INTRAVENOUS
  Administered 2016-02-17: 30 mg via INTRAVENOUS
  Administered 2016-02-17: 20 mg via INTRAVENOUS

## 2016-02-17 MED ORDER — PROPOFOL 10 MG/ML IV BOLUS
INTRAVENOUS | Status: AC
  Filled 2016-02-17: qty 60

## 2016-02-17 MED ORDER — PROPOFOL 500 MG/50ML IV EMUL
INTRAVENOUS | Status: DC | PRN
  Administered 2016-02-17: 100 ug/kg/min via INTRAVENOUS

## 2016-02-17 SURGICAL SUPPLY — 22 items

## 2016-02-17 NOTE — Anesthesia Preprocedure Evaluation (Addendum)
Anesthesia Evaluation  Patient identified by MRN, date of birth, ID band Patient awake    Reviewed: Allergy & Precautions, NPO status , Patient's Chart, lab work & pertinent test results  Airway Mallampati: II  TM Distance: >3 FB     Dental  (+) Teeth Intact, Dental Advisory Given   Pulmonary former smoker,    breath sounds clear to auscultation       Cardiovascular hypertension,  Rhythm:Regular Rate:Normal     Neuro/Psych    GI/Hepatic   Endo/Other    Renal/GU      Musculoskeletal   Abdominal   Peds  Hematology   Anesthesia Other Findings   Reproductive/Obstetrics                            Anesthesia Physical Anesthesia Plan  ASA: III  Anesthesia Plan: MAC   Post-op Pain Management:    Induction:   Airway Management Planned: Natural Airway and Simple Face Mask  Additional Equipment:   Intra-op Plan:   Post-operative Plan:   Informed Consent: I have reviewed the patients History and Physical, chart, labs and discussed the procedure including the risks, benefits and alternatives for the proposed anesthesia with the patient or authorized representative who has indicated his/her understanding and acceptance.   Dental advisory given  Plan Discussed with: CRNA and Anesthesiologist  Anesthesia Plan Comments:         Anesthesia Quick Evaluation

## 2016-02-17 NOTE — Anesthesia Postprocedure Evaluation (Signed)
Anesthesia Post Note  Patient: Jay [name]  Procedure(s) Performed: Procedure(s) (LRB): COLONOSCOPY WITH PROPOFOL (N/A)  Patient location during evaluation: Endoscopy Anesthesia Type: MAC Level of consciousness: awake and alert, awake and oriented Pain management: pain level controlled Vital Signs Assessment: post-procedure vital signs reviewed and stable Respiratory status: spontaneous breathing, nonlabored ventilation and respiratory function stable Cardiovascular status: blood pressure returned to baseline Anesthetic complications: no    Last Vitals:  Vitals:   02/17/16 0836 02/17/16 1015  BP: (!) 128/53 (!) 98/40  Pulse: 96 80  Resp: 15 17  Temp: 37.2 C 36.5 C    Last Pain:  Vitals:   02/17/16 1015  TempSrc: Oral                 Tirso Laws COKER

## 2016-02-17 NOTE — Op Note (Signed)
[facility] Patient Name: Jay [name] Procedure Date: 02/17/2016 MRN: [id number] Attending MD: Carol Ada , MD Date of Birth: 29-Aug-1950 CSN: [id number] Age: 65 Admit Type: Outpatient Procedure:                Colonoscopy Indications:              Hematochezia, Iron deficiency anemia Providers:                Carol Ada, MD, Laverta Baltimore RN, RN, Corliss Parish, Technician Referring MD:              Medicines:                Propofol per Anesthesia Complications:            No immediate complications. Estimated Blood Loss:     Estimated blood loss: none. Procedure:                Pre-Anesthesia Assessment:                           - Prior to the procedure, a History and Physical                            was performed, and patient medications and                            allergies were reviewed. The patient's tolerance of                            previous anesthesia was also reviewed. The risks                            and benefits of the procedure and the sedation                            options and risks were discussed with the patient.                            All questions were answered, and informed consent                            was obtained. Prior Anticoagulants: The patient has                            taken no previous anticoagulant or antiplatelet                            agents. ASA Grade Assessment: III - A patient with                            severe systemic disease. After reviewing the risks  and benefits, the patient was deemed in                            satisfactory condition to undergo the procedure.                           - Sedation was administered by an anesthesia                            professional. Deep sedation was attained.                           After obtaining informed consent, the colonoscope                            was passed under  direct vision. Throughout the                            procedure, the patient's blood pressure, pulse, and                            oxygen saturations were monitored continuously. The                            [id number] scope was introduced through                            the anus and advanced to the 15 cm into the ileum.                            The colonoscopy was performed without difficulty.                            The patient tolerated the procedure well. The                            quality of the bowel preparation was good. The                            terminal ileum, ileocecal valve, appendiceal                            orifice, and rectum were photographed. Scope In: 9:48:52 AM Scope Out: 10:06:35 AM Scope Withdrawal Time: 0 hours 11 minutes 12 seconds  Total Procedure Duration: 0 hours 17 minutes 43 seconds  Findings:      The entire examined colon appeared normal. Deep intubation of the TI was       achieved and the mucosa was completely normal. No evidence of any       ulcerations or erosions to suggest Crohn's disease. Impression:               - The entire examined colon is normal.                           -  No specimens collected. Moderate Sedation:      N/A- Per Anesthesia Care Recommendation:           - Patient has a contact number available for                            emergencies. The signs and symptoms of potential                            delayed complications were discussed with the                            patient. Return to normal activities tomorrow.                            Written discharge instructions were provided to the                            patient.                           - Resume previous diet.                           - Continue present medications - budesonide for now.                           - Repeat colonoscopy in 10 years for surveillance.                           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 This issue was discussed with                            dermatology. Procedure Code(s):        --- Professional ---                           [phone], Colonoscopy, flexible; diagnostic, including                            collection of specimen(s) by brushing or washing,                            when performed (separate procedure) Diagnosis Code(s):        --- Professional ---  K92.1, Melena (includes Hematochezia)                           D50.9, Iron deficiency anemia, unspecified CPT copyright 2016 American [employer] Association. All rights reserved. The codes documented in this report are preliminary and upon coder review may  be revised to meet current compliance requirements. Carol Ada, MD Carol Ada, MD 02/17/2016 10:17:17 AM This report has been signed electronically. Number of Addenda: 0

## 2016-02-17 NOTE — Discharge Instructions (Signed)

## 2016-02-17 NOTE — Transfer of Care (Signed)
Immediate Anesthesia Transfer of Care Note  Patient: Jay [name]  Procedure(s) Performed: Procedure(s): COLONOSCOPY WITH PROPOFOL (N/A)  Patient Location: PACU  Anesthesia Type:MAC  Level of Consciousness: Patient easily awoken, sedated, comfortable, cooperative, following commands, responds to stimulation.   Airway & Oxygen Therapy: Patient spontaneously breathing, ventilating well, oxygen via simple oxygen mask.  Post-op Assessment: Report given to PACU RN, vital signs reviewed and stable, moving all extremities.   Post vital signs: Reviewed and stable.  Complications: No apparent anesthesia complications Last Vitals:  Vitals:   02/17/16 0836 02/17/16 1015  BP: (!) 128/53 (!) 98/40  Pulse: 96 80  Resp: 15 17  Temp: 37.2 C     Last Pain:  Vitals:   02/17/16 1015  TempSrc: Oral         Complications: No apparent anesthesia complications

## 2016-02-20 ENCOUNTER — Encounter (HOSPITAL_COMMUNITY): Payer: Self-pay | Admitting: Gastroenterology

## 2016-02-20 LAB — POCT I-STAT 4, (NA,K, GLUC, HGB,HCT)
GLUCOSE: 78 mg/dL (ref 65–99)
HCT: 41 % (ref 39.0–52.0)
Hemoglobin: 13.9 g/dL (ref 13.0–17.0)
Potassium: 3.6 mmol/L (ref 3.5–5.1)
Sodium: 142 mmol/L (ref 135–145)

## 2016-03-12 ENCOUNTER — Telehealth: Payer: Self-pay | Admitting: Cardiovascular Disease

## 2016-03-12 NOTE — Telephone Encounter (Signed)
Spoke with DOD she states that pt should keep appt tomorrow no classic CHF symptoms  Pt notified

## 2016-03-12 NOTE — Telephone Encounter (Signed)
Patient states that he has gained about 20 lbs in fluid over the past week.  He has an appointment with [facility] tomorrow 11-14.  Pls. Call pt to advise him as to what he should do about this fluid retention.

## 2016-03-12 NOTE — Telephone Encounter (Signed)
Spoke with pt states that his weight is up in the last week. Pt states that his weight before was 218 and now it is 241. Pt states that he is taking his medication as directed.pt is taking lasix 40mg  daily with his potassium 17meq. Pt states that his BP is running 150/70's and dosn't know pulse. Pt states that he does get SOB occasionally on exertion but not at rest. Denies any other symptoms. Please advise

## 2016-03-13 ENCOUNTER — Encounter: Payer: Self-pay | Admitting: Cardiology

## 2016-03-13 ENCOUNTER — Ambulatory Visit (INDEPENDENT_AMBULATORY_CARE_PROVIDER_SITE_OTHER): Payer: Medicare HMO | Admitting: Cardiology

## 2016-03-13 DIAGNOSIS — Z951 Presence of aortocoronary bypass graft: Secondary | ICD-10-CM | POA: Diagnosis not present

## 2016-03-13 DIAGNOSIS — K5010 Crohn's disease of large intestine without complications: Secondary | ICD-10-CM | POA: Insufficient documentation

## 2016-03-13 DIAGNOSIS — G4730 Sleep apnea, unspecified: Secondary | ICD-10-CM

## 2016-03-13 DIAGNOSIS — I255 Ischemic cardiomyopathy: Secondary | ICD-10-CM

## 2016-03-13 DIAGNOSIS — J438 Other emphysema: Secondary | ICD-10-CM

## 2016-03-13 DIAGNOSIS — D500 Iron deficiency anemia secondary to blood loss (chronic): Secondary | ICD-10-CM

## 2016-03-13 DIAGNOSIS — I5043 Acute on chronic combined systolic (congestive) and diastolic (congestive) heart failure: Secondary | ICD-10-CM

## 2016-03-13 DIAGNOSIS — K50111 Crohn's disease of large intestine with rectal bleeding: Secondary | ICD-10-CM

## 2016-03-13 DIAGNOSIS — D649 Anemia, unspecified: Secondary | ICD-10-CM | POA: Insufficient documentation

## 2016-03-13 MED ORDER — FUROSEMIDE 80 MG PO TABS: 80.0000 mg | ORAL_TABLET | Freq: Every day | ORAL | 0 refills | Status: DC

## 2016-03-13 NOTE — Progress Notes (Signed)
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 This past summer he had some problems with anemia and a GI work up revealed Crohn's colitis. He was placed on steroids. About two weeks ago he required transfusion. Since then he noted a 20 lb wgt gain . He was told at the transfusion center he looked like her was "dehydrated" based on his labs. The pt cut back on his Lasix from 80 mg daily to 40 mg daily. Since then he has had increasing LE edema, orthopnea, and wgt. His wgt 12/16/15 was 220 lbs, today, 238 lbs.    Current Outpatient Prescriptions  Medication Sig Dispense Refill  . albuterol (PROAIR HFA) 108 (90 BASE) MCG/ACT inhaler Inhale 1 puff into the lungs daily. And prn    . atorvastatin (LIPITOR) 20 MG tablet Take 1 tablet by mouth daily.    . Budesonide (UCERIS) 9 MG TB24 Take by mouth daily.    . Cyanocobalamin (VITAMIN B 12) 100 MCG LOZG Take 2,500 mcg by mouth daily.     . ferrous sulfate 325 (65 FE) MG tablet Take 325 mg by mouth daily with breakfast.    . Fluticasone-Salmeterol (ADVAIR) 250-50 MCG/DOSE AEPB Inhale 1 puff into the lungs daily.    . furosemide (LASIX) 80 MG tablet Take 1 tablet (80 mg total) by mouth daily. 90 tablet 0  . ipratropium-albuterol (DUONEB) 0.5-2.5 (3) MG/3ML SOLN Inhale 3 mLs into the lungs every 6 (six) hours as needed (wheezing or SOB).     [name] metolazone (ZAROXOLYN) 2.5 MG tablet take 1 tablet by mouth 30 MINUTES PRIOR TO FUROSEMIDE IF WEIGHT IS GREATER THAN 240 LBS 30 tablet 5  . montelukast (SINGULAIR) 10 MG tablet Take 10 mg by mouth at bedtime.    [name] omeprazole (PRILOSEC) 40 MG capsule Take 40 mg by mouth daily.     . potassium chloride (KLOR-CON M10) 10 MEQ tablet Take 2 tablets (20 mEq total) by mouth 2 (two) times daily. 360  tablet 3  . Secukinumab (COSENTYX SENSOREADY PEN) 150 MG/ML SOAJ q month    . tiotropium (SPIRIVA HANDIHALER) 18 MCG inhalation capsule Place 1 Inhaler into inhaler and inhale daily.    . Urea 50 % CREA Apply 1 application topically 3 (three) times daily.  0  . zolpidem (AMBIEN) 10 MG tablet Take 10 mg by mouth at bedtime as needed for sleep.      No current facility-administered medications for this visit.     Allergies  Allergen Reactions  . Lipitor [Atorvastatin] Other (See Comments)    Myalgia Myalgia  . Angiotensin Receptor Blockers Other (See Comments)    Hyperkalemia    Social History   Social History  . Marital status: Married    Spouse name: N/A  . Number of children: N/A  . Years of education: N/A   Occupational History  . Not on file.   Social History Main Topics  . Smoking status: Former Smoker    Packs/day: 2.00    Years: 30.00    Types: Cigarettes    Quit date: 04/30/2000  . Smokeless tobacco: Never Used  . Alcohol use 0.0 oz/week     Comment: occ. to rare  . Drug use: No  . Sexual activity: Not on file   Other Topics Concern  .  Not on file   Social History Narrative  . No narrative on file     Review of Systems: General: negative for chills, fever, night sweats or weight changes.  Cardiovascular: negative for chest pain, dyspnea on exertion, edema, orthopnea, palpitations, paroxysmal nocturnal dyspnea or shortness of breath Dermatological: negative for rash Respiratory: negative for cough or wheezing Urologic: negative for hematuria Abdominal: negative for nausea, vomiting, diarrhea, bright red blood per rectum, melena, or hematemesis Neurologic: negative for visual changes, syncope, or dizziness All other systems reviewed and are otherwise negative except as noted above.    Blood pressure 140/84, pulse 75, height 5\' 9"  (1.753 m), weight 239 lb 6.4 oz (108.6 kg).  General appearance: alert, cooperative, no distress and moderately obese Neck:  no carotid bruit and no JVD Lungs: dereased breath sounds Heart: regular rate and rhythm Abdomen: obese Extremities: 1+ edema Skin: Skin color, texture, turgor normal. No rashes or lesions Neurologic: Grossly normal   ASSESSMENT AND PLAN:   Acute on chronic combined systolic and diastolic CHF (congestive heart failure) ([place]) Pt seen in the office today with 20 lb wgt gain over two weeks.  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 Myoview May 2016- intermediate with scar, no ischemia  Sleep apnea C-pap intolerant  Anemia Jay [name] to be GI- work up by Jay Jay [name] June 2017-transfused in [facility] Sept 2017  Crohn's colitis [facility]) Diagnosed June 2017, f/u colonoscopy Oct 2017 "OK"  COPD (chronic obstructive pulmonary disease) Former heavy smoker   PLAN  I think his BUN must have been elevated from GI blood loss when he had his pre transfusion labs. I suggested he go back to 80 mg daily of Lasix (which he actually already done yesterday) and he can take metolazone which he has for a couple of days till his wgt comes back to closer to 220 lbs. We'll see him back in a week or so and check labs. I reviewed his labs from 01/25/16 in [place].   Jay Ransom PA-C 03/13/2016 3:08 PM

## 2016-03-13 NOTE — Patient Instructions (Signed)
Medication Instructions:  INCREASE Lasix to 80mg  take 1 tablet once a day TAKE Metolazone 2.5mg  take 1 tablet 30 minutes before taking Lasix daily UNTIL your weight comes back down to 220.  Labwork: None  Testing/Procedures: None   Follow-Up: Your physician recommends that you schedule a follow-up appointment in: [place] OR DR CROITORU  Any Other Special Instructions Will Be Listed Below (If Applicable).     If you need a refill on your cardiac medications before your next appointment, please call your pharmacy.

## 2016-03-13 NOTE — Assessment & Plan Note (Signed)
EF 40-45% by echo Sept 2015

## 2016-03-13 NOTE — Assessment & Plan Note (Signed)
C-pap intolerant 

## 2016-03-13 NOTE — Assessment & Plan Note (Signed)
Felt to be GI- work up by Dr Benson Norway June 2017-transfused in [facility] Sept 2017

## 2016-03-13 NOTE — Assessment & Plan Note (Signed)
Pt seen in the office today with 20 lb wgt gain over two weeks.

## 2016-03-13 NOTE — Assessment & Plan Note (Signed)
Former heavy smoker

## 2016-03-13 NOTE — Assessment & Plan Note (Addendum)
CABG x 4 2007, cath 2008- occluded SVG-OM, medical Rx No angina. Myoview May 2016- intermediate with scar, no ischemia

## 2016-03-13 NOTE — Assessment & Plan Note (Signed)
Diagnosed June 2017, f/u colonoscopy Oct 2017 "OK"

## 2016-03-21 ENCOUNTER — Encounter: Payer: Self-pay | Admitting: *Deleted

## 2016-03-27 ENCOUNTER — Encounter: Payer: Self-pay | Admitting: Cardiology

## 2016-03-27 ENCOUNTER — Ambulatory Visit (INDEPENDENT_AMBULATORY_CARE_PROVIDER_SITE_OTHER): Payer: Medicare HMO | Admitting: Cardiology

## 2016-03-27 VITALS — BP 118/67 | HR 77 | Ht 69.0 in | Wt 240.4 lb

## 2016-03-27 DIAGNOSIS — D500 Iron deficiency anemia secondary to blood loss (chronic): Secondary | ICD-10-CM

## 2016-03-27 DIAGNOSIS — Z951 Presence of aortocoronary bypass graft: Secondary | ICD-10-CM

## 2016-03-27 DIAGNOSIS — K50118 Crohn's disease of large intestine with other complication: Secondary | ICD-10-CM

## 2016-03-27 DIAGNOSIS — I255 Ischemic cardiomyopathy: Secondary | ICD-10-CM

## 2016-03-27 DIAGNOSIS — I5043 Acute on chronic combined systolic (congestive) and diastolic (congestive) heart failure: Secondary | ICD-10-CM

## 2016-03-27 LAB — BASIC METABOLIC PANEL
BUN: 17 mg/dL (ref 7–25)
CO2: 31 mmol/L (ref 20–31)
Calcium: 9.1 mg/dL (ref 8.6–10.3)
Chloride: 104 mmol/L (ref 98–110)
Creat: 1.07 mg/dL (ref 0.70–1.25)
Glucose, Bld: 63 mg/dL — ABNORMAL LOW (ref 65–99)
Potassium: 4.3 mmol/L (ref 3.5–5.3)
Sodium: 144 mmol/L (ref 135–146)

## 2016-03-27 NOTE — Assessment & Plan Note (Signed)
CABG x 4 2007, cath 2008- occluded SVG-OM, medical Rx

## 2016-03-27 NOTE — Assessment & Plan Note (Signed)
Diagnosed June 2017, f/u colonoscopy Oct 2017 "OK"

## 2016-03-27 NOTE — Progress Notes (Signed)
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 This past summer he had some problems with anemia and a GI work up revealed Crohn's colitis. He was placed on steroids. In Sept he required transfusion. I saw him back in the office 03/13/16 after he noted a 20 lb wgt gain . He was told at the transfusion center he looked like her was "dehydrated" based on his labs. The pt cut back on his Lasix from 80 mg daily to 40 mg daily. His wgt 12/16/15 was 220 lbs, on 03/13/16 it was 238 lbs.  I had him go back to his usual dose of Lasix- 80 mg BID and gave him a couple of doses of Zaroxolyn. He is in the office today for follow up. His edema has resolved. His wgt in the office is 240 but the pt says his home scale show a 10 lb wgt loss to 230 lbs.    Current Outpatient Prescriptions  Medication Sig Dispense Refill  . albuterol (PROAIR HFA) 108 (90 BASE) MCG/ACT inhaler Inhale 1 puff into the lungs daily. And prn    . atorvastatin (LIPITOR) 20 MG tablet Take 1 tablet by mouth daily.    . Budesonide (UCERIS) 9 MG TB24 Take by mouth daily.    . Cyanocobalamin (VITAMIN B 12) 100 MCG LOZG Take 2,500 mcg by mouth daily.     . ferrous sulfate 325 (65 FE) MG tablet Take 325 mg by mouth daily with breakfast.    . Fluticasone-Salmeterol (ADVAIR) 250-50 MCG/DOSE AEPB Inhale 1 puff into the lungs daily.    . furosemide (LASIX) 80 MG tablet Take 1 tablet (80 mg total) by mouth daily. 90 tablet 0  . ipratropium-albuterol (DUONEB) 0.5-2.5 (3) MG/3ML SOLN Inhale 3 mLs into the lungs every 6 (six) hours as needed (wheezing or SOB).     [name] metolazone (ZAROXOLYN) 2.5 MG tablet take 1 tablet by mouth 30 MINUTES PRIOR TO FUROSEMIDE IF WEIGHT IS GREATER THAN 240 LBS 30 tablet 5  .  montelukast (SINGULAIR) 10 MG tablet Take 10 mg by mouth at bedtime.    [name] omeprazole (PRILOSEC) 40 MG capsule Take 40 mg by mouth daily.     . potassium chloride (KLOR-CON M10) 10 MEQ tablet Take 2 tablets (20 mEq total) by mouth 2 (two) times daily. 360 tablet 3  . Secukinumab (COSENTYX SENSOREADY PEN) 150 MG/ML SOAJ q month    . tiotropium (SPIRIVA HANDIHALER) 18 MCG inhalation capsule Place 1 Inhaler into inhaler and inhale daily.    . Urea 50 % CREA Apply 1 application topically 3 (three) times daily.  0  . zolpidem (AMBIEN) 10 MG tablet Take 10 mg by mouth at bedtime as needed for sleep.      No current facility-administered medications for this visit.     Allergies  Allergen Reactions  . Lipitor [Atorvastatin] Other (See Comments)    Myalgia Myalgia  . Angiotensin Receptor Blockers Other (See Comments)    Hyperkalemia    Social History   Social History  . Marital status: Married    Spouse name: N/A  . Number of children: N/A  . Years of education: N/A   Occupational History  . Not on file.   Social History Main Topics  .  Smoking status: Former Smoker    Packs/day: 2.00    Years: 30.00    Types: Cigarettes    Quit date: 04/30/2000  . Smokeless tobacco: Never Used  . Alcohol use 0.0 oz/week     Comment: occ. to rare  . Drug use: No  . Sexual activity: Not on file   Other Topics Concern  . Not on file   Social History Narrative  . No narrative on file     Review of Systems: General: negative for chills, fever, night sweats or weight changes.  Cardiovascular: negative for chest pain, dyspnea on exertion, edema, orthopnea, palpitations, paroxysmal nocturnal dyspnea or shortness of breath Dermatological: negative for rash Respiratory: negative for cough or wheezing Urologic: negative for hematuria Abdominal: negative for nausea, vomiting, diarrhea, bright red blood per rectum, melena, or hematemesis Neurologic: negative for visual changes, syncope, or  dizziness All other systems reviewed and are otherwise negative except as noted above.    Blood pressure 118/67, pulse 77, height 5\' 9"  (1.753 m), weight 240 lb 6.4 oz (109 kg).  General appearance: alert, cooperative, no distress and moderately obese Neck: no JVD Lungs: clear to auscultation bilaterally Heart: regular rate and rhythm Extremities: extremities normal, atraumatic, no cyanosis or edema Skin: Skin color, texture, turgor normal. No rashes or lesions Neurologic: Grossly normal   ASSESSMENT AND PLAN:   Acute on chronic combined systolic and diastolic CHF (congestive heart failure) ([facility]) No wgt change by our office scales but the pt says he has lost 20 lbs by his home scales and his LE edema has resolved.  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 I did order a BMP today. He will follow up with Dr Sallyanne Kuster in March. He knows to avoid salt.   Kerin Ransom PA-C 03/27/2016 8:26 AM

## 2016-03-27 NOTE — Assessment & Plan Note (Signed)
EF 40-45% by echo Sept 2015

## 2016-03-27 NOTE — Assessment & Plan Note (Signed)
No wgt change by our office scales but the pt says he has lost 20 lbs by his home scales and his LE edema has resolved.

## 2016-03-27 NOTE — Patient Instructions (Signed)
Medication Instructions:   NO CHANGE  Labwork:  Your physician recommends that you HAVE LAB WORK TODAY  Follow-Up:  Your physician recommends that you schedule a follow-up appointment in: [place]  If you need a refill on your cardiac medications before your next appointment, please call your pharmacy.

## 2016-03-27 NOTE — Assessment & Plan Note (Signed)
Felt to be GI- work up by Dr Benson Norway June 2017-transfused in [facility] Sept 2017

## 2016-05-15 ENCOUNTER — Inpatient Hospital Stay (HOSPITAL_COMMUNITY): Admission: RE | Admit: 2016-05-15 | Payer: Medicare HMO | Source: Ambulatory Visit

## 2016-05-24 ENCOUNTER — Other Ambulatory Visit: Payer: Self-pay | Admitting: Cardiology

## 2016-06-14 ENCOUNTER — Telehealth: Payer: Self-pay | Admitting: Cardiovascular Disease

## 2016-06-14 NOTE — Telephone Encounter (Signed)
Received records from [facility] for appointment on 06/26/16 with Dr Sallyanne Kuster.  Records put with Dr Victorino December schedule on 06/26/16. lp

## 2016-06-25 NOTE — [facility] Notes (Signed)
Cardiology Office Note    Date:  06/26/2016   ID:  Jay [name], DOB Aug 20, 1950, MRN [id number]  PCP:  Jay Geralds, MD  Cardiologist:   Jay Klein, MD   No chief complaint: CHF and CAD follow up   History of Present Illness:  Jay [name] is a 66 y.o. male with coronary artery disease, chronic systolic and diastolic heart failure, history of near-syncope related to nonsustained ventricular tachycardia, severe chronic lung disease returning for follow-up.  He has generally done well over the last 6 months and has not had any problems with angina pectoris or shortness of breath. He has been conscientiously following his weight.  He has moderate to severe exertional dyspnea related to chronic lung disease. He denies palpitations, near syncope or syncope, claudication or focal neurological complaints. He has been working in his yard and does not feel that there has been a real change in his chronic stamina. He has not needed to take any metolazone in months.  On February 26 was seen in the emergency room for swelling, redness and pain on the lateral surface of his right foot. He did not have fever or chills. X-rays did not show osteomyelitis. Diagnosed as cellulitis. No history of gout and does not look like gout He was given an single parenteral dose of clindamycin and then discharged with oral Bactrim, but the swelling, redness and tenderness have all worsened after 3 days of therapy.  Labs performed 03/27/2016 show potassium of 4.3 and a creatinine of 1.07, sodium 144. Hemoglobin in October had normalized at 13.9. Last hemoglobin A1c was 6%. Last lipid profile in May showed total cholesterol 96, HDL 36, LDL 39, triglycerides of 108  Last summer, he underwent upper and lower endoscopy for worsening anemia, performed by Dr. Benson [name]. No abnormalities were seen in the stomach or esophagus or large intestine,but he was found to have terminal ileitis with ulcerations, possibly  related to NSAID versus Crohn's disease use. Biopsy confirmed active ileitis but did not show any granulomas. Since then his anemia has resolved with iron supplements.  He has psoriasis and psoriatic arthritis. Dr. Benson [name] suggested possible treatment with a biological that would cover both the arthritis and any possible component of Crohn's. Trying to get insurance coverage for Stelara.  He has a history of coronary artery disease and underwent multivessel bypass surgery in 2007 (LIMA to LAD, SVG to PDA, SVG to OM, SVG to ramus intermedius, Jay [name]) and underwent repeat cardiac catheterization in 2008: He had interim total occlusion of the vein graft to the OM and ramus intermedius, patent but mildly diseased SVG to PDA and a patent LIMA to the LAD. At that time his left ventricular ejection fraction was 40-45% and he had evidence of elevated left ventricular end-diastolic pressure. Most recent echo 12/2013 shows an ejection fraction of around 40% on my review. He has obstructive sleep apnea but is poorly compliant with CPAP. He has well treated hypercholesterolemia with mild residual hypertriglyceridemia and a low HDL.  Past Medical History:  Diagnosis Date  . Arthritis   . Borderline diabetes   . Cancer [facility]) 2008   Prostate cancer- s/p Rob. prostate surgery '08  . COPD (chronic obstructive pulmonary disease) ([place])   . Coronary artery disease   . Crohn's disease ([place])   . Dysrhythmia    ? near syncope episodes, arhythmia -not proven cause- symptoms had stop when monitored with continuous rhythm monitor.  Jay [name] GERD (gastroesophageal reflux disease)   . History  of home oxygen therapy    2 liters prn and at hs  . Hyperlipidemia   . Hypertension   . Ischemic cardiomyopathy    Ejection fraction 40-45%  . Myocardial infarction 2008   x1  . Obesity, Class II, BMI 35-39.9   . Obstructive sleep apnea on CPAP    uses cpap setting of 10  . Psoriasis   . S/P CABG x 4 2007   Dr.  Prescott [name]   .  Tobacco abuse   . Transfusion history    x 1  -s/p CABPG    Past Surgical History:  Procedure Laterality Date  . CARDIAC CATHETERIZATION  07/04/2006   significant 3 vessel disease: totally vein graft to the ramus intermedius,toalled vein graft to the obtuse marginal, mildly diseased vein graft of the posterior descending artery, mildly depressed LV systolic fx.  . COLONOSCOPY WITH PROPOFOL N/A 10/28/2015   Procedure: COLONOSCOPY WITH PROPOFOL;  Surgeon: Jay Ada, MD;  Location: [facility];  Service: Endoscopy;  Laterality: N/A;  . COLONOSCOPY WITH PROPOFOL N/A 02/17/2016   Procedure: COLONOSCOPY WITH PROPOFOL;  Surgeon: Jay Ada, MD;  Location: [facility];  Service: Endoscopy;  Laterality: N/A;  . CORONARY ARTERY BYPASS GRAFT  02/14/2006   LIMA to LAD,SVG to posterior descending,SVG to CX,SVG to ramus intermediate.  . CYSTOSCOPY    . ESOPHAGOGASTRODUODENOSCOPY (EGD) WITH PROPOFOL N/A 10/28/2015   Procedure: ESOPHAGOGASTRODUODENOSCOPY (EGD) WITH PROPOFOL;  Surgeon: Jay Ada, MD;  Location: [facility];  Service: Endoscopy;  Laterality: N/A;  . XI ROBOTIC ASSISTED SIMPLE PROSTATECTOMY      Current Medications: Outpatient Medications Prior to Visit  Medication Sig Dispense Refill  . albuterol (PROAIR HFA) 108 (90 BASE) MCG/ACT inhaler Inhale 1 puff into the lungs daily. And prn    . atorvastatin (LIPITOR) 20 MG tablet Take 1 tablet by mouth daily.    . Cyanocobalamin (VITAMIN B 12) 100 MCG LOZG Take 2,500 mcg by mouth daily.     . ferrous sulfate 325 (65 FE) MG tablet Take 325 mg by mouth daily with breakfast.    . Fluticasone-Salmeterol (ADVAIR) 250-50 MCG/DOSE AEPB Inhale 1 puff into the lungs daily.    . furosemide (LASIX) 80 MG tablet TAKE 1 TABLET DAILY 90 tablet 0  . ipratropium-albuterol (DUONEB) 0.5-2.5 (3) MG/3ML SOLN Inhale 3 mLs into the lungs every 6 (six) hours as needed (wheezing or SOB).     Jay [name] metolazone (ZAROXOLYN) 2.5 MG tablet take 1 tablet by mouth 30  MINUTES PRIOR TO FUROSEMIDE IF WEIGHT IS GREATER THAN 240 LBS 30 tablet 5  . montelukast (SINGULAIR) 10 MG tablet Take 10 mg by mouth at bedtime.    Jay [name] omeprazole (PRILOSEC) 40 MG capsule Take 40 mg by mouth daily.     . potassium chloride (KLOR-CON M10) 10 MEQ tablet Take 2 tablets (20 mEq total) by mouth 2 (two) times daily. 360 tablet 3  . Secukinumab (COSENTYX SENSOREADY PEN) 150 MG/ML SOAJ q month    . tiotropium (SPIRIVA HANDIHALER) 18 MCG inhalation capsule Place 1 Inhaler into inhaler and inhale daily.    . Urea 50 % CREA Apply 1 application topically 3 (three) times daily.  0  . zolpidem (AMBIEN) 10 MG tablet Take 10 mg by mouth at bedtime as needed for sleep.     . Budesonide (UCERIS) 9 MG TB24 Take by mouth daily.     No facility-administered medications prior to visit.      Allergies:   Lipitor [atorvastatin] and Angiotensin receptor blockers  Social History   Social History  . Marital status: Married    Spouse name: N/A  . Number of children: N/A  . Years of education: N/A   Social History Main Topics  . Smoking status: Former Smoker    Packs/day: 2.00    Years: 30.00    Types: Cigarettes    Quit date: 04/30/2000  . Smokeless tobacco: Never Used  . Alcohol use 0.0 oz/week     Comment: occ. to rare  . Drug use: No  . Sexual activity: Not Asked   Other Topics Concern  . None   Social History Narrative  . None     Family History:  The patient's family history includes Heart attack in his father.   ROS:   Please see the history of present illness.    ROS All other systems reviewed and are negative.   PHYSICAL EXAM:   VS:  BP (!) 118/58   Pulse 87   Ht 5\' 9"  (1.753 m)   Wt 112.5 kg (248 lb)   SpO2 92%   BMI 36.62 kg/m    GEN: Well nourished, well developed, in no acute distress  HEENT: normal  Neck: no JVD, carotid bruits, or masses Cardiac: RRR; no murmurs, rubs, or gallops,no edema  Respiratory:  clear to auscultation bilaterally, normal work of  breathing GI: soft, nontender, nondistended, + BS MS: no deformity or atrophy  Skin: warm and dry, no rash. The left foot is swollen especially across the mid lateral surface where there is a roughly 8 cm ellipsoid bright red rash, slightly tender to touch. Neuro:  Alert and Oriented x 3, Strength and sensation are intact Psych: euthymic mood, full affect  Wt Readings from Last 3 Encounters:  06/26/16 112.5 kg (248 lb)  03/27/16 109 kg (240 lb 6.4 oz)  03/13/16 108.6 kg (239 lb 6.4 oz)      Studies/Labs Reviewed:   EKG:  EKG is ordered today.  The ekg ordered today demonstrates Sinus rhythm withPACs and a couple of PVCs, incomplete right bundle branch block, inferior and anterolateral ST segment depression and T-wave inversion, QTC 462 ms.   Recent Labs: 02/17/2016: Hemoglobin 13.9 03/27/2016: BUN 17; Creat 1.07; Potassium 4.3; Sodium 144   Lipid Panel in May 2017: total cholesterol 96, HDL 36, LDL 39, triglycerides of 108  Additional studies/ records that were reviewed today include:  Notes from ED evaluation for foot cellulitis    ASSESSMENT:    1. Chronic combined systolic and diastolic CHF (congestive heart failure) ([place])   2. NSVT (nonsustained ventricular tachycardia) ([place])   3. Coronary artery disease involving coronary bypass graft of native heart without angina pectoris   4. Other emphysema ([place])   5. Pure hypercholesterolemia   6. Obstructive sleep apnea syndrome   7. Mild obesity   8. Iron deficiency anemia due to chronic blood loss   9. Cellulitis of left foot excluding toes        PLAN:  In order of problems listed above:  1. CHF: Well compensated, clinically at euvolemia, NYHA functional class II He is on appropriate treatment with beta blockers in the maximum tolerated dose, angiotensin receptor blocker and diuretics. Has not had to take additional metolazone and months. Most recent estimated LVEF 40%. 2. NSVT: No episodes of presyncope or syncope.  Continue beta blocker ( limited by COPD). 3. CAD: No angina pectoris on current medical regimen 4. COPD: Unchanged 5. HLP: Reports compliance with statin, need to get updated labs from  his primary care provider 6. OSA: Not compliant with CPAP 7. Obesity: Advised to continue efforts at weight loss. 8. Anemia: Appears to have resolved 9. Cellulitis: He does not appear to be responding well to Bactrim. Given a prescription for Augmentin. If still not getting better may need CT/MRI to make sure he does not have osteomyelitis or other deep infection. He has a follow-up appointment in PCPs office on Friday.  Medication Adjustments/Labs and Tests Ordered: Current medicines are reviewed at length with the patient today.  Concerns regarding medicines are outlined above.  Medication changes, Labs and Tests ordered today are listed in the Patient Instructions below. Patient Instructions  Dr Sallyanne Kuster has recommended making the following medication changes: 1. TAKE Augmentin 875-125 mg - take 1 tablet by mouth twice daily for 10 days  Your physician recommends that you schedule a follow-up appointment in 1 year. You will receive a reminder letter in the mail two months in advance. If you don't receive a letter, please call our office to schedule the follow-up appointment.  If you need a refill on your cardiac medications before your next appointment, please call your pharmacy.    Signed, Jay Klein, MD  06/26/2016 8:33 PM    [address], [place], [place]  [address] Phone: [phone]; Fax: [phone]

## 2016-06-26 ENCOUNTER — Encounter: Payer: Self-pay | Admitting: Cardiovascular Disease

## 2016-06-26 ENCOUNTER — Ambulatory Visit (INDEPENDENT_AMBULATORY_CARE_PROVIDER_SITE_OTHER): Payer: Medicare HMO | Admitting: Cardiovascular Disease

## 2016-06-26 VITALS — BP 118/58 | HR 87 | Ht 69.0 in | Wt 248.0 lb

## 2016-06-26 DIAGNOSIS — I472 Ventricular tachycardia: Secondary | ICD-10-CM

## 2016-06-26 DIAGNOSIS — E669 Obesity, unspecified: Secondary | ICD-10-CM

## 2016-06-26 DIAGNOSIS — E7800 Pure hypercholesterolemia, unspecified: Secondary | ICD-10-CM

## 2016-06-26 DIAGNOSIS — D500 Iron deficiency anemia secondary to blood loss (chronic): Secondary | ICD-10-CM

## 2016-06-26 DIAGNOSIS — J438 Other emphysema: Secondary | ICD-10-CM

## 2016-06-26 DIAGNOSIS — G4733 Obstructive sleep apnea (adult) (pediatric): Secondary | ICD-10-CM | POA: Diagnosis not present

## 2016-06-26 DIAGNOSIS — I5042 Chronic combined systolic (congestive) and diastolic (congestive) heart failure: Secondary | ICD-10-CM

## 2016-06-26 DIAGNOSIS — L03116 Cellulitis of left lower limb: Secondary | ICD-10-CM

## 2016-06-26 DIAGNOSIS — I25810 Atherosclerosis of coronary artery bypass graft(s) without angina pectoris: Secondary | ICD-10-CM | POA: Diagnosis not present

## 2016-06-26 DIAGNOSIS — I4729 Other ventricular tachycardia: Secondary | ICD-10-CM

## 2016-06-26 MED ORDER — AMOXICILLIN-POT CLAVULANATE 875-125 MG PO TABS: 1.0000 | ORAL_TABLET | Freq: Two times a day (BID) | ORAL | 0 refills | Status: DC

## 2016-06-26 MED ORDER — AMOXICILLIN-POT CLAVULANATE 875-125 MG PO TABS: 1.0000 | ORAL_TABLET | Freq: Two times a day (BID) | ORAL | 0 refills | Status: AC

## 2016-06-26 NOTE — Patient Instructions (Signed)
Dr Sallyanne Kuster has recommended making the following medication changes: 1. TAKE Augmentin 875-125 mg - take 1 tablet by mouth twice daily for 10 days  Your physician recommends that you schedule a follow-up appointment in 1 year. You will receive a reminder letter in the mail two months in advance. If you don't receive a letter, please call our office to schedule the follow-up appointment.  If you need a refill on your cardiac medications before your next appointment, please call your pharmacy.

## 2016-07-02 ENCOUNTER — Other Ambulatory Visit: Payer: Self-pay | Admitting: Cardiology

## 2017-01-04 ENCOUNTER — Telehealth: Payer: Self-pay | Admitting: Cardiovascular Disease

## 2017-01-04 ENCOUNTER — Encounter: Payer: Self-pay | Admitting: Cardiovascular Disease

## 2017-01-04 ENCOUNTER — Ambulatory Visit (INDEPENDENT_AMBULATORY_CARE_PROVIDER_SITE_OTHER): Payer: Medicare HMO | Admitting: Cardiovascular Disease

## 2017-01-04 VITALS — BP 130/88 | HR 128 | Ht 69.0 in | Wt 233.6 lb

## 2017-01-04 DIAGNOSIS — I471 Supraventricular tachycardia, unspecified: Secondary | ICD-10-CM

## 2017-01-04 DIAGNOSIS — J438 Other emphysema: Secondary | ICD-10-CM

## 2017-01-04 DIAGNOSIS — E7800 Pure hypercholesterolemia, unspecified: Secondary | ICD-10-CM

## 2017-01-04 DIAGNOSIS — G4733 Obstructive sleep apnea (adult) (pediatric): Secondary | ICD-10-CM

## 2017-01-04 DIAGNOSIS — D500 Iron deficiency anemia secondary to blood loss (chronic): Secondary | ICD-10-CM | POA: Diagnosis not present

## 2017-01-04 DIAGNOSIS — E669 Obesity, unspecified: Secondary | ICD-10-CM | POA: Diagnosis not present

## 2017-01-04 DIAGNOSIS — I5042 Chronic combined systolic (congestive) and diastolic (congestive) heart failure: Secondary | ICD-10-CM | POA: Diagnosis not present

## 2017-01-04 DIAGNOSIS — I25810 Atherosclerosis of coronary artery bypass graft(s) without angina pectoris: Secondary | ICD-10-CM

## 2017-01-04 NOTE — Telephone Encounter (Signed)
New message   Sam from [facility] needs patient seen today for tachycardia, also would like to speak to nurse.

## 2017-01-04 NOTE — Telephone Encounter (Signed)
Spoke to Dr Lorne Skeens, at [facility].  per dr Lorne Skeens - patient ws there today  - recent released from hospital for cellulitis/gout for 3 days- started on prednsione completed .  Patient c/o a cold,  post ive for cough , no chest pain , ekg -done  Heart rate in the 140's  (Copy sent to office) patient did take albuterol today.  Reviewed with Dr Sallyanne Kuster and ekg shown. Appointment schedule for today at 1:20 pm - information given to Dr Lorne Skeens . She states she will contact patient.

## 2017-01-04 NOTE — Patient Instructions (Addendum)
Dr Sallyanne Kuster recommends that you have an EKG again on Monday as well as be seen by a provider. I will call you to give you an appointment time.

## 2017-01-04 NOTE — Progress Notes (Signed)
Cardiology Office Note    Date:  01/04/2017   ID:  Jay [name], DOB [birth year], MRN [id number]  PCP:  Jay Sauer, MD  Cardiologist:   Jay Klein, MD   No chief complaint: CHF and CAD follow up   History of Present Illness:  Jay [name] is a 66 y.o. male with coronary artery disease, chronic systolic and diastolic heart failure, history of near-syncope related to nonsustained ventricular tachycardia, severe chronic lung disease referred today from Jay [name] office for tachycardia.  He was seen in follow up there today and his heart rate was 146 bpm. The computer interpretation was of sinus tachycardia, but I believe the rhythm is actually probably atrial flutter with 2:1 AV block. I wanted to do another ECG in our office today, but he declined. During his exam the rhythm was regular at 150 bpm. Carotid sinus compression during my exam had no impact on the rhythm whatsoever.  He says he feels fine and believes all he needs is rest. He plans to drink plenty of fluids and rest over the weekend. He does not want to undergo any additional investigations today.  He was hospitalized in [facility] in late August, for what was [name] to be left foot cellulitis/arthritis with gout flare. He tells me was. He received antibiotics. In the end was [name] that he probably had inflammatory arthritis and he was advised to follow-up with his rheumatologist. During that hospitalization his heart rate was documented to be in the 70-90s range.  He reports subsequently having an upper respiratory infection ("cold") and has received antibiotics and oral steroids. He has just finished his treatment and believes that if given time to rest, he will get much better.   He has chronic moderate to severe exertional dyspnea related to chronic lung disease. He is not aware of palpitations, he denies near syncope or syncope, claudication or focal neurological complaints. He has not had problems with edema or  chest pain. Denies orthopnea or PND. He has not needed to take any metolazone in months. He is now taking Jay [name] which has led to improvement in both his symptoms of psoriatic arthritis and Crohn's disease. He has not had GI bleeding since January when he required transfusions.  Labs performed 10/29/2016 hemoglobin of 13, white blood cell count 7.9, normal iron saturation, elevated ferritin consistent with chronic inflammation. Sedimentation rate and CRP were also markedly elevated Last lipid profile in 12/24/2016 showed total cholesterol 127, HDL 51, LDL 56, triglycerides of 99. Creatinine on August 29 was normal at 1.01. Normal liver function tests on 12/24/2016.  He has a history of coronary artery disease and underwent multivessel bypass surgery in 2007 (LIMA to LAD, SVG to PDA, SVG to OM, SVG to ramus intermedius, Jay [name]) and underwent repeat cardiac catheterization in 2008: He had interim total occlusion of the vein graft to the OM and ramus intermedius, patent but mildly diseased SVG to PDA and a patent LIMA to the LAD. At that time his left ventricular ejection fraction was 40-45% and he had evidence of elevated left ventricular end-diastolic pressure. Most recent echo 12/2013 shows an ejection fraction of around 40% on my review. He has obstructive sleep apnea but is poorly compliant with CPAP. He has well treated hypercholesterolemia with mild residual hypertriglyceridemia and a low HDL.  Past Medical History:  Diagnosis Date  . Arthritis   . Borderline diabetes   . Cancer Jay [name]) 2008   Prostate cancer- s/p Rob. prostate surgery '08  .  COPD (chronic obstructive pulmonary disease) (Jay [name])   . Coronary artery disease   . Crohn's disease (Jay [name])   . Dysrhythmia    ? near syncope episodes, arhythmia -not proven cause- symptoms had stop when monitored with continuous rhythm monitor.  [name] GERD (gastroesophageal reflux disease)   . History of home oxygen therapy    2 liters prn and at hs  .  Hyperlipidemia   . Hypertension   . Ischemic cardiomyopathy    Ejection fraction 40-45%  . Myocardial infarction [facility]) 2008   x1  . Obesity, Class II, BMI 35-39.9   . Obstructive sleep apnea on CPAP    uses cpap setting of 10  . Psoriasis   . S/P CABG x 4 2007   Dr.  Prescott [name]   . Tobacco abuse   . Transfusion history    x 1  -s/p CABPG    Past Surgical History:  Procedure Laterality Date  . CARDIAC CATHETERIZATION  07/04/2006   significant 3 vessel disease: totally vein graft to the ramus intermedius,toalled vein graft to the obtuse marginal, mildly diseased vein graft of the posterior descending artery, mildly depressed LV systolic fx.  . COLONOSCOPY WITH PROPOFOL N/A 10/28/2015   Procedure: COLONOSCOPY WITH PROPOFOL;  Surgeon: Carol Ada, MD;  Location: [facility];  Service: Endoscopy;  Laterality: N/A;  . COLONOSCOPY WITH PROPOFOL N/A 02/17/2016   Procedure: COLONOSCOPY WITH PROPOFOL;  Surgeon: Carol Ada, MD;  Location: [facility];  Service: Endoscopy;  Laterality: N/A;  . CORONARY ARTERY BYPASS GRAFT  02/14/2006   LIMA to LAD,SVG to posterior descending,SVG to CX,SVG to ramus intermediate.  . CYSTOSCOPY    . ESOPHAGOGASTRODUODENOSCOPY (EGD) WITH PROPOFOL N/A 10/28/2015   Procedure: ESOPHAGOGASTRODUODENOSCOPY (EGD) WITH PROPOFOL;  Surgeon: Carol Ada, MD;  Location: [facility];  Service: Endoscopy;  Laterality: N/A;  . XI ROBOTIC ASSISTED SIMPLE PROSTATECTOMY      Current Medications: Outpatient Medications Prior to Visit  Medication Sig Dispense Refill  . albuterol (PROAIR HFA) 108 (90 BASE) MCG/ACT inhaler Inhale 1 puff into the lungs daily. And prn    . amoxicillin-clavulanate (AUGMENTIN) 875-125 MG tablet Take 1 tablet by mouth 2 (two) times daily. 20 tablet 0  . atorvastatin (LIPITOR) 20 MG tablet Take 1 tablet by mouth every other Jay.     . Cyanocobalamin (VITAMIN B 12) 100 MCG LOZG Take 2,500 mcg by mouth daily.     . ferrous sulfate 325 (65 FE) MG tablet  Take 325 mg by mouth daily with breakfast.    . Fluticasone-Salmeterol (ADVAIR) 250-50 MCG/DOSE AEPB Inhale 1 puff into the lungs daily.    . furosemide (LASIX) 80 MG tablet TAKE 1 TABLET DAILY 90 tablet 0  . ipratropium-albuterol (DUONEB) 0.5-2.5 (3) MG/3ML SOLN Inhale 3 mLs into the lungs every 6 (six) hours as needed (wheezing or SOB).     [name] metolazone (ZAROXOLYN) 2.5 MG tablet take 1 tablet by mouth 30 MINUTES PRIOR TO FUROSEMIDE IF WEIGHT IS GREATER THAN 240 LBS 30 tablet 5  . montelukast (SINGULAIR) 10 MG tablet Take 10 mg by mouth at bedtime.    [name] omeprazole (PRILOSEC) 40 MG capsule Take 40 mg by mouth daily.     . potassium chloride (KLOR-CON M10) 10 MEQ tablet Take 2 tablets (20 mEq total) by mouth 2 (two) times daily. 360 tablet 3  . tiotropium (SPIRIVA HANDIHALER) 18 MCG inhalation capsule Place 1 Inhaler into inhaler and inhale daily.    . Urea 50 % CREA Apply 1 application topically  3 (three) times daily.  0  . zolpidem (AMBIEN) 10 MG tablet Take 10 mg by mouth at bedtime as needed for sleep.     . Secukinumab (COSENTYX SENSOREADY PEN) 150 MG/ML SOAJ q month     No facility-administered medications prior to visit.      Allergies:   Lipitor [atorvastatin] and Angiotensin receptor blockers   Social History   Social History  . Marital status: Married    Spouse name: N/A  . Number of children: N/A  . Years of education: N/A   Social History Main Topics  . Smoking status: Former Smoker    Packs/Jay: 2.00    Years: 30.00    Types: Cigarettes    Quit date: 04/30/2000  . Smokeless tobacco: Never Used  . Alcohol use 0.0 oz/week     Comment: occ. to rare  . Drug use: No  . Sexual activity: Not Asked   Other Topics Concern  . None   Social History Narrative  . None     Family History:  The patient's family history includes Heart attack in his father.   ROS:   Please see the history of present illness.    ROS All other systems reviewed and are negative.   PHYSICAL  EXAM:   VS:  BP 130/88   Pulse (!) 128   Ht 5\' 9"  (1.753 m)   Wt 233 lb 9.6 oz (106 kg)   BMI 34.50 kg/m     General: Alert, oriented x3, no distress, mildly obese Head: no evidence of trauma, PERRL, EOMI, no exophtalmos or lid lag, no myxedema, no xanthelasma; normal ears, nose and oropharynx Neck: normal jugular venous pulsations and no hepatojugular reflux; brisk carotid pulses without delay and no carotid bruits Chest: clear to auscultation, no signs of consolidation by percussion or palpation, normal fremitus, symmetrical and full respiratory excursions Cardiovascular: normal position and quality of the apical impulse, very rapid but regular rhythm, normal first and second heart sounds, no murmurs, rubs or gallops Abdomen: no tenderness or distention, no masses by palpation, no abnormal pulsatility or arterial bruits, normal bowel sounds, no hepatosplenomegaly Extremities: no clubbing, cyanosis or edema; 2+ radial, ulnar and brachial pulses bilaterally; 2+ right femoral, posterior tibial and dorsalis pedis pulses; 2+ left femoral, posterior tibial and dorsalis pedis pulses; no subclavian or femoral bruits Neurological: grossly nonfocal  Psych: euthymic mood, full affect  Wt Readings from Last 3 Encounters:  01/04/17 233 lb 9.6 oz (106 kg)  06/26/16 248 lb (112.5 kg)  03/27/16 240 lb 6.4 oz (109 kg)      Studies/Labs Reviewed:   EKG:  EKG is ordered today in Jay [name] office. I believe it probably shows atrial flutter with 2:1 AV block, although it's also possible he has an ectopic atrial tachycardia with 1:1 AV conduction. There are no ischemic repolarization abnormalities.   Recent Labs: 02/17/2016: Hemoglobin 13.9 03/27/2016: BUN 17; Creat 1.07; Potassium 4.3; Sodium 144  12/24/2016 WBC 11.7 (down to 8.2 on 12/26/2016), hemoglobin 11.6 , normal iron saturation, elevated ferritin consistent with chronic inflammation. Sedimentation rate and CRP were also markedly elevated  Creatinine on August 29 was normal at 1.01. Normal liver function tests on 12/24/2016.  Lipid Panel 12/24/2016  total cholesterol 127, HDL 51, LDL 56, triglycerides of 99.   Additional studies/ records that were reviewed today include:  Notes from PCP visit today. Labs x-rays and notes from late August from hospitalization [place]. including chest x-ray August 13 and August 27  ASSESSMENT:    1. SVT (supraventricular tachycardia) ([place])   2. Chronic combined systolic and diastolic CHF (congestive heart failure) ([place])   3. Coronary artery disease involving coronary bypass graft of native heart without angina pectoris   4. Other emphysema ([place])   5. Pure hypercholesterolemia   6. Obstructive sleep apnea syndrome   7. Mild obesity   8. Iron deficiency anemia due to chronic blood loss     PLAN:  In order of problems listed above:  1. Atrial flutter/PAT: I don't think his rhythm today is sinus tachycardia. I discussed the implications of atrial flutter for heart failure exacerbation and risk of stroke. I told him that we typically recommend anticoagulants, but he does not want to consider this due to his recent problems with GI bleeding. I told him that if the arrhythmia lasts for more than about 48 hours we definitely have increased worry about the risk of stroke and I would like him reevaluated on Monday. We also talked about the potential worsening of his heart condition due to the persistent tachycardia. He understands that if he develops shortness of breath or dizziness he should go to the emergency room. In the past he was on a beta blocker but this has been discontinued, presumably due to his problems with reactive airway disease. He declines additional medications today. He believes that just waiting will make everything get better. I asked him to come back on Monday for repeat evaluation. If he is still in atrial flutter, I would recommend starting anticoagulation and setting up TEE  guided cardioversion. In the long run he'll probably do best with radiofrequency ablation, since his GI problems premature risk for recurrent bleeding. 2. CHF: Other than tachycardia he appears  well compensated, clinically euvolemic, NYHA functional class II He is on appropriate treatment with beta blockers in the maximum tolerated dose, angiotensin receptor blocker and diuretics. Has not had to take additional metolazone and months. Most recent estimated LVEF 40% by echo and 46% by nuclear stress test in May 2016. 3. CAD: No angina pectoris despite current tachycardia 4. COPD: Unchanged, has been the biggest limitation to his functional status in the past. 5. HLP: Recent lab results are satisfactory 6. OSA: Not compliant with CPAP. It's quite possible that his atrial arrhythmias relates to this. 7. Obesity: Advised to continue efforts at weight loss. 8. Anemia: Appears to have resolved. No evidence of iron deficiency. If atrial flutter is confirmed at follow-up, I would strongly recommend a direct oral anticoagulant, while we consider referral for EP evaluation and ablation.  Medication Adjustments/Labs and Tests Ordered: Current medicines are reviewed at length with the patient today.  Concerns regarding medicines are outlined above.  Medication changes, Labs and Tests ordered today are listed in the Patient Instructions below. Patient Instructions  Dr Sallyanne Kuster recommends that you have an EKG again on Monday as well as be seen by a provider. I will call you to give you an appointment time.    Signed, Jay Klein, MD  01/04/2017 5:57 PM    [facility] [address], [place], [place]  [address] Phone: [phone]; Fax: [phone]

## 2017-01-04 NOTE — Telephone Encounter (Signed)
Follow up    PCP is calling wanting to speak to nurse.

## 2017-01-07 ENCOUNTER — Ambulatory Visit (INDEPENDENT_AMBULATORY_CARE_PROVIDER_SITE_OTHER): Payer: Medicare HMO | Admitting: Cardiology

## 2017-01-07 ENCOUNTER — Encounter: Payer: Self-pay | Admitting: Cardiology

## 2017-01-07 VITALS — BP 148/72 | HR 91 | Ht 69.0 in | Wt 235.0 lb

## 2017-01-07 DIAGNOSIS — I484 Atypical atrial flutter: Secondary | ICD-10-CM | POA: Diagnosis not present

## 2017-01-07 DIAGNOSIS — I25810 Atherosclerosis of coronary artery bypass graft(s) without angina pectoris: Secondary | ICD-10-CM

## 2017-01-07 DIAGNOSIS — E7800 Pure hypercholesterolemia, unspecified: Secondary | ICD-10-CM | POA: Diagnosis not present

## 2017-01-07 DIAGNOSIS — I255 Ischemic cardiomyopathy: Secondary | ICD-10-CM

## 2017-01-07 DIAGNOSIS — I5042 Chronic combined systolic (congestive) and diastolic (congestive) heart failure: Secondary | ICD-10-CM

## 2017-01-07 MED ORDER — METOPROLOL SUCCINATE ER 25 MG PO TB24: 12.5000 mg | ORAL_TABLET | Freq: Every day | ORAL | 3 refills | Status: DC

## 2017-01-07 MED ORDER — METOPROLOL SUCCINATE ER 25 MG PO TB24: 12.5000 mg | ORAL_TABLET | Freq: Every day | ORAL | 6 refills | Status: DC

## 2017-01-07 NOTE — Progress Notes (Signed)
HPI: FU atrial flutter vs EAT. Patient also with history of coronary artery disease, chronic combined systolic/diastolic congestive heart failure, history of near syncope related to nonsustained ventricular tachycardia and severe chronic lung disease. Patient underwent coronary artery bypass graft in 2007 with a LIMA to the LAD, saphenous vein graft to PDA, saphenous vein graft to obtuse marginal and saphenous vein graft to ramus intermedius. Repeat catheterization in 2008 showed total occlusion of the vein graft to the obtuse marginal and vein graft to the intermediate. Ejection fraction 40-45%. Patient treated medically. Echocardiogram September 2015 was poor quality but ejection fraction estimated at 35% and there was moderate biatrial enlargement, mild right ventricular enlargement and moderately reduced RV function. Last nuclear study May 2016 showed ejection fraction 46%. There was prior infarct but no ischemia. Monitor May 2016 showed occasional PACs, brief run of PAT, frequent PVCs and one 12 beat run of nonsustained ventricular tachycardia Patient was seen on September 7 by Dr. Sallyanne Kuster and felt likely to have atrial flutter. The patient declined any treatment at that time including initiating any additional meds. There was discussion about initiating anticoagulation but the patient declined this given history of Chron's GI bleeding which last occurred in January 2018. Plan was to follow-up today and if atrial flutter persisted to initiate anticoagulation and proceed with TEE guided cardioversion and consider ablation. Since he was last seen he describes URI symptoms with rhinorrhea and cough that is nonproductive. No fevers or chills. He otherwise denies dyspnea, chest pain, palpitations, syncope or pedal edema.   Current Outpatient Prescriptions  Medication Sig Dispense Refill  . albuterol (PROAIR HFA) 108 (90 BASE) MCG/ACT inhaler Inhale 1 puff into the lungs daily. And prn    .  amoxicillin-clavulanate (AUGMENTIN) 875-125 MG tablet Take 1 tablet by mouth 2 (two) times daily. 20 tablet 0  . atorvastatin (LIPITOR) 20 MG tablet Take 1 tablet by mouth every other day.     . Cyanocobalamin (VITAMIN B 12) 100 MCG LOZG Take 2,500 mcg by mouth daily.     . ferrous sulfate 325 (65 FE) MG tablet Take 325 mg by mouth daily with breakfast.    . Fluticasone-Salmeterol (ADVAIR) 250-50 MCG/DOSE AEPB Inhale 1 puff into the lungs daily.    . furosemide (LASIX) 80 MG tablet TAKE 1 TABLET DAILY 90 tablet 0  . ipratropium-albuterol (DUONEB) 0.5-2.5 (3) MG/3ML SOLN Inhale 3 mLs into the lungs every 6 (six) hours as needed (wheezing or SOB).     [name] metolazone (ZAROXOLYN) 2.5 MG tablet take 1 tablet by mouth 30 MINUTES PRIOR TO FUROSEMIDE IF WEIGHT IS GREATER THAN 240 LBS 30 tablet 5  . montelukast (SINGULAIR) 10 MG tablet Take 10 mg by mouth at bedtime.    [name] omeprazole (PRILOSEC) 40 MG capsule Take 40 mg by mouth daily.     . potassium chloride (KLOR-CON M10) 10 MEQ tablet Take 2 tablets (20 mEq total) by mouth 2 (two) times daily. 360 tablet 3  . tiotropium (SPIRIVA HANDIHALER) 18 MCG inhalation capsule Place 1 Inhaler into inhaler and inhale daily.    . Urea 50 % CREA Apply 1 application topically 3 (three) times daily.  0  . ustekinumab (STELARA) 90 MG/ML SOSY injection Inject 90 mg into the skin every 3 (three) months.    . zolpidem (AMBIEN) 10 MG tablet Take 10 mg by mouth at bedtime as needed for sleep.      No current facility-administered medications for this visit.  Past Medical History:  Diagnosis Date  . Arthritis   . Borderline diabetes   . Cancer [facility]) 2008   Prostate cancer- s/p Rob. prostate surgery '08  . COPD (chronic obstructive pulmonary disease) ([place])   . Coronary artery disease   . Crohn's disease ([place])   . Dysrhythmia    ? near syncope episodes, arhythmia -not proven cause- symptoms had stop when monitored with continuous rhythm monitor.  [name] GERD  (gastroesophageal reflux disease)   . History of home oxygen therapy    2 liters prn and at hs  . Hyperlipidemia   . Hypertension   . Ischemic cardiomyopathy    Ejection fraction 40-45%  . Myocardial infarction [facility]) 2008   x1  . Obesity, Class II, BMI 35-39.9   . Obstructive sleep apnea on CPAP    uses cpap setting of 10  . Psoriasis   . S/P CABG x 4 2007   Dr.  [name]   . Tobacco abuse   . Transfusion history    x 1  -s/p CABPG    Past Surgical History:  Procedure Laterality Date  . CARDIAC CATHETERIZATION  07/04/2006   significant 3 vessel disease: totally vein graft to the ramus intermedius,toalled vein graft to the obtuse marginal, mildly diseased vein graft of the posterior descending artery, mildly depressed LV systolic fx.  . COLONOSCOPY WITH PROPOFOL N/A 10/28/2015   Procedure: COLONOSCOPY WITH PROPOFOL;  Surgeon: Carol Ada, MD;  [facility];  Service: Endoscopy;  Laterality: N/A;  . COLONOSCOPY WITH PROPOFOL N/A 02/17/2016   Procedure: COLONOSCOPY WITH PROPOFOL;  Surgeon: Carol Ada, MD;  [facility]: [facility];  Service: Endoscopy;  Laterality: N/A;  . CORONARY ARTERY BYPASS GRAFT  02/14/2006   LIMA to LAD,SVG to posterior descending,SVG to CX,SVG to ramus intermediate.  . CYSTOSCOPY    . ESOPHAGOGASTRODUODENOSCOPY (EGD) WITH PROPOFOL N/A 10/28/2015   Procedure: ESOPHAGOGASTRODUODENOSCOPY (EGD) WITH PROPOFOL;  Surgeon: Carol Ada, MD;  [facility];  Service: Endoscopy;  Laterality: N/A;  . XI ROBOTIC ASSISTED SIMPLE PROSTATECTOMY      Social History   Social History  . Marital status: Married    Spouse name: N/A  . Number of children: N/A  . Years of education: N/A   Occupational History  . Not on file.   Social History Main Topics  . Smoking status: Former Smoker    Packs/day: 2.00    Years: 30.00    Types: Cigarettes    Quit date: 04/30/2000  . Smokeless tobacco: Never Used  . Alcohol use 0.0 oz/week     Comment: occ. to  rare  . Drug use: No  . Sexual activity: Not on file   Other Topics Concern  . Not on file   Social History Narrative  . No narrative on file    Family History  Problem Relation Age of Onset  . Heart attack Father     ROS: cough but o fevers or chills, hemoptysis, dysphasia, odynophagia, melena, hematochezia, dysuria, hematuria, rash, seizure activity, orthopnea, PND, pedal edema, claudication. Remaining systems are negative.  Physical Exam: Well-developed obese in no acute distress.  Skin is warm and dry.  HEENT is normal.  Neck is supple.  Chest is clear to auscultation with normal expansion.  Cardiovascular exam is regular rate and rhythm.  Abdominal exam nontender or distended. No masses palpated. Extremities show no edema. neuro grossly intact  ECG- Sinus rhythm with PACs and PVCs. Nonspecific lateral T-wave changes. personally reviewed  A/P  1  atrial flutter versus ectopic atrial tachycardia-I reviewed the patient's previous previous ECG from 9/7 and I feel this is probable atrial flutter. I cannot exclude ectopic atrial tachycardia. However he has converted to sinus rhythm on fu electrocardiogram today. We discussed initiating anticoagulation but he declined citing his history of Crohn's disease and GI bleeding. He understands the risk of embolic event. I will add Toprol 12.5 mg daily to assist with rate control if atrial arrhythmias recur. He apparently had hypotension with cardiac medications in the past. We will plan to repeat his echocardiogram. Finally I discussed the possibility of ablation and he will review this further with Dr. Sallyanne Kuster in the near future.  2 cardiomyopathy-LV function has been reduced in the past. He is not on a beta blocker and apparently has an allergy to ARB's. I will add Toprol 12.5 mg daily and this can be advanced as tolerated. If he does not have a true allergy then an ARB would be useful in the future if blood pressure allows. Repeat  echocardiogram to reassess LV function.  3 chronic combined systolic/diastolic congestive heart failure-patient appears to be euvolemic on examination. Continue present dose of diuretics.  4 history of coronary artery disease-continue aspirin and statin.  5 Obesity-needs weight loss.  6 hyperlipidemia-continue statin.   7 upper respiratory infection-patient has cold symptoms but does not have a productive cough. He states his symptoms are actually improving.      Kirk Ruths, MD

## 2017-01-07 NOTE — Patient Instructions (Signed)
Medication Instructions:   START METOPROLOL SUCC ER 12.5 MG ONCE DAILY AT BEDTIME= 1/2 OF THE 25 MG TABLET ONCE DAILY  Testing/Procedures:  Your physician has requested that you have an echocardiogram. Echocardiography is a painless test that uses sound waves to create images of your heart. It provides your doctor with information about the size and shape of your heart and how well your heart's chambers and valves are working. This procedure takes approximately one hour. There are no restrictions for this procedure.    Follow-Up:  Your physician recommends that you schedule a follow-up appointment in: [place]

## 2017-01-14 ENCOUNTER — Ambulatory Visit: Payer: Medicare HMO | Admitting: Cardiology

## 2017-01-15 ENCOUNTER — Other Ambulatory Visit: Payer: Self-pay

## 2017-01-15 ENCOUNTER — Ambulatory Visit (HOSPITAL_COMMUNITY): Payer: Medicare HMO | Attending: Cardiology

## 2017-01-15 DIAGNOSIS — I484 Atypical atrial flutter: Secondary | ICD-10-CM

## 2017-01-15 DIAGNOSIS — I110 Hypertensive heart disease with heart failure: Secondary | ICD-10-CM | POA: Insufficient documentation

## 2017-01-15 DIAGNOSIS — E119 Type 2 diabetes mellitus without complications: Secondary | ICD-10-CM | POA: Diagnosis not present

## 2017-01-15 DIAGNOSIS — E785 Hyperlipidemia, unspecified: Secondary | ICD-10-CM | POA: Insufficient documentation

## 2017-01-15 DIAGNOSIS — J449 Chronic obstructive pulmonary disease, unspecified: Secondary | ICD-10-CM | POA: Insufficient documentation

## 2017-01-15 DIAGNOSIS — I509 Heart failure, unspecified: Secondary | ICD-10-CM | POA: Insufficient documentation

## 2017-01-16 ENCOUNTER — Ambulatory Visit (HOSPITAL_COMMUNITY): Payer: Medicare HMO

## 2017-01-18 ENCOUNTER — Telehealth: Payer: Self-pay

## 2017-01-18 NOTE — Telephone Encounter (Signed)
1. Type of surgery: right TKR 2. Date of surgery: pending 3. Surgeon: Dr Becky Sax 4. Medications that need to be held & how long: N/A 5. Fax and/or Phone: (p) [phone] (f) [phone]

## 2017-01-20 ENCOUNTER — Encounter: Payer: Self-pay | Admitting: Cardiovascular Disease

## 2017-01-20 NOTE — Telephone Encounter (Signed)
epicd 

## 2017-02-10 ENCOUNTER — Other Ambulatory Visit: Payer: Self-pay | Admitting: Cardiology

## 2017-02-19 ENCOUNTER — Telehealth: Payer: Self-pay | Admitting: Cardiovascular Disease

## 2017-02-19 NOTE — Telephone Encounter (Signed)
Received a call from patient.He stated for the 2 days his heart rate has been 140 to 150.Stated heart is still beating fast at present.He is sob with exertion.Advised to go to [facility] ED.Stated he will go [place].

## 2017-02-19 NOTE — Telephone Encounter (Signed)
Patient c/o Palpitations:  High priority if patient c/o lightheadedness, shortness of breath, or chest pain  1) How long have you had palpitations/irregular HR/ Afib? Are you having the symptoms now? 2 days/yes  2) Are you currently experiencing lightheadedness, SOB or CP? Yes sob   3) Do you have a history of afib (atrial fibrillation) or irregular heart rhythm? unknown  4) Have you checked your BP or HR? (document readings if available): hr  140-145  5) Are you experiencing any other symptoms? Feel bad

## 2017-02-20 ENCOUNTER — Emergency Department (HOSPITAL_COMMUNITY): Payer: Medicare HMO

## 2017-02-20 ENCOUNTER — Encounter (HOSPITAL_COMMUNITY): Payer: Self-pay | Admitting: *Deleted

## 2017-02-20 ENCOUNTER — Other Ambulatory Visit (HOSPITAL_COMMUNITY): Payer: Self-pay

## 2017-02-20 ENCOUNTER — Telehealth: Payer: Self-pay | Admitting: Cardiovascular Disease

## 2017-02-20 ENCOUNTER — Other Ambulatory Visit: Payer: Self-pay

## 2017-02-20 ENCOUNTER — Observation Stay (HOSPITAL_COMMUNITY)
Admission: EM | Admit: 2017-02-20 | Discharge: 2017-02-21 | Disposition: A | Discharge status: Home / Self Care | Payer: Medicare HMO | Attending: Cardiovascular Disease | Admitting: Cardiovascular Disease

## 2017-02-20 DIAGNOSIS — I110 Hypertensive heart disease with heart failure: Secondary | ICD-10-CM | POA: Insufficient documentation

## 2017-02-20 DIAGNOSIS — Z87891 Personal history of nicotine dependence: Secondary | ICD-10-CM | POA: Diagnosis not present

## 2017-02-20 DIAGNOSIS — D649 Anemia, unspecified: Secondary | ICD-10-CM | POA: Diagnosis not present

## 2017-02-20 DIAGNOSIS — Z951 Presence of aortocoronary bypass graft: Secondary | ICD-10-CM

## 2017-02-20 DIAGNOSIS — I484 Atypical atrial flutter: Secondary | ICD-10-CM | POA: Diagnosis not present

## 2017-02-20 DIAGNOSIS — G4733 Obstructive sleep apnea (adult) (pediatric): Secondary | ICD-10-CM | POA: Diagnosis not present

## 2017-02-20 DIAGNOSIS — I4892 Unspecified atrial flutter: Secondary | ICD-10-CM | POA: Diagnosis present

## 2017-02-20 DIAGNOSIS — Z8546 Personal history of malignant neoplasm of prostate: Secondary | ICD-10-CM | POA: Diagnosis not present

## 2017-02-20 DIAGNOSIS — Z9079 Acquired absence of other genital organ(s): Secondary | ICD-10-CM | POA: Insufficient documentation

## 2017-02-20 DIAGNOSIS — J438 Other emphysema: Secondary | ICD-10-CM | POA: Diagnosis not present

## 2017-02-20 DIAGNOSIS — I255 Ischemic cardiomyopathy: Secondary | ICD-10-CM | POA: Diagnosis present

## 2017-02-20 DIAGNOSIS — K219 Gastro-esophageal reflux disease without esophagitis: Secondary | ICD-10-CM | POA: Insufficient documentation

## 2017-02-20 DIAGNOSIS — I5041 Acute combined systolic (congestive) and diastolic (congestive) heart failure: Secondary | ICD-10-CM | POA: Diagnosis present

## 2017-02-20 DIAGNOSIS — I25810 Atherosclerosis of coronary artery bypass graft(s) without angina pectoris: Secondary | ICD-10-CM | POA: Diagnosis not present

## 2017-02-20 DIAGNOSIS — Z9981 Dependence on supplemental oxygen: Secondary | ICD-10-CM | POA: Insufficient documentation

## 2017-02-20 DIAGNOSIS — E669 Obesity, unspecified: Secondary | ICD-10-CM | POA: Diagnosis not present

## 2017-02-20 DIAGNOSIS — J449 Chronic obstructive pulmonary disease, unspecified: Secondary | ICD-10-CM | POA: Insufficient documentation

## 2017-02-20 DIAGNOSIS — K5010 Crohn's disease of large intestine without complications: Secondary | ICD-10-CM | POA: Insufficient documentation

## 2017-02-20 DIAGNOSIS — G4730 Sleep apnea, unspecified: Secondary | ICD-10-CM | POA: Diagnosis present

## 2017-02-20 DIAGNOSIS — I252 Old myocardial infarction: Secondary | ICD-10-CM | POA: Insufficient documentation

## 2017-02-20 DIAGNOSIS — L409 Psoriasis, unspecified: Secondary | ICD-10-CM | POA: Insufficient documentation

## 2017-02-20 DIAGNOSIS — Z6836 Body mass index (BMI) 36.0-36.9, adult: Secondary | ICD-10-CM | POA: Insufficient documentation

## 2017-02-20 DIAGNOSIS — E785 Hyperlipidemia, unspecified: Secondary | ICD-10-CM | POA: Diagnosis not present

## 2017-02-20 DIAGNOSIS — R000 Tachycardia, unspecified: Secondary | ICD-10-CM

## 2017-02-20 DIAGNOSIS — K50118 Crohn's disease of large intestine with other complication: Secondary | ICD-10-CM | POA: Diagnosis not present

## 2017-02-20 DIAGNOSIS — K50111 Crohn's disease of large intestine with rectal bleeding: Secondary | ICD-10-CM | POA: Diagnosis not present

## 2017-02-20 DIAGNOSIS — I5043 Acute on chronic combined systolic (congestive) and diastolic (congestive) heart failure: Secondary | ICD-10-CM | POA: Insufficient documentation

## 2017-02-20 DIAGNOSIS — Z79899 Other long term (current) drug therapy: Secondary | ICD-10-CM | POA: Insufficient documentation

## 2017-02-20 LAB — MAGNESIUM: Magnesium: 1.9 mg/dL (ref 1.7–2.4)

## 2017-02-20 LAB — BRAIN NATRIURETIC PEPTIDE: B Natriuretic Peptide: 354.7 pg/mL — ABNORMAL HIGH (ref 0.0–100.0)

## 2017-02-20 LAB — CBC
HCT: 38.8 % — ABNORMAL LOW (ref 39.0–52.0)
HCT: 37.3 % — ABNORMAL LOW (ref 39.0–52.0)
HEMOGLOBIN: 11.8 g/dL — AB (ref 13.0–17.0)
Hemoglobin: 11.9 g/dL — ABNORMAL LOW (ref 13.0–17.0)
MCH: 29.6 pg (ref 26.0–34.0)
MCH: 31 pg (ref 26.0–34.0)
MCHC: 30.4 g/dL (ref 30.0–36.0)
MCHC: 31.9 g/dL (ref 30.0–36.0)
MCV: 97.2 fL (ref 78.0–100.0)
MCV: 97.1 fL (ref 78.0–100.0)
Platelets: 218 10*3/uL (ref 150–400)
Platelets: 219 10*3/uL (ref 150–400)
RBC: 3.99 MIL/uL — ABNORMAL LOW (ref 4.22–5.81)
RBC: 3.84 MIL/uL — ABNORMAL LOW (ref 4.22–5.81)
RDW: 15.1 % (ref 11.5–15.5)
RDW: 15.3 % (ref 11.5–15.5)
WBC: 8.5 10*3/uL (ref 4.0–10.5)
WBC: 8.1 10*3/uL (ref 4.0–10.5)

## 2017-02-20 LAB — BASIC METABOLIC PANEL
ANION GAP: 9 (ref 5–15)
BUN: 13 mg/dL (ref 6–20)
CALCIUM: 9.3 mg/dL (ref 8.9–10.3)
CO2: 29 mmol/L (ref 22–32)
Chloride: 100 mmol/L — ABNORMAL LOW (ref 101–111)
Creatinine, Ser: 1.03 mg/dL (ref 0.61–1.24)
GLUCOSE: 123 mg/dL — AB (ref 65–99)
Potassium: 4.5 mmol/L (ref 3.5–5.1)
Sodium: 138 mmol/L (ref 135–145)

## 2017-02-20 LAB — HEMOGLOBIN A1C
Hgb A1c MFr Bld: 6.4 % — ABNORMAL HIGH (ref 4.8–5.6)
Mean Plasma Glucose: 136.98 mg/dL

## 2017-02-20 LAB — CREATININE, SERUM
Creatinine, Ser: 1.18 mg/dL (ref 0.61–1.24)
GFR calc Af Amer: >60 mL/min (ref >60)
GFR calc non Af Amer: >60 mL/min (ref >60)

## 2017-02-20 LAB — I-STAT TROPONIN, ED: TROPONIN I, POC: 0.08 ng/mL (ref 0.00–0.08)

## 2017-02-20 LAB — TSH: TSH: 0.647 u[IU]/mL (ref 0.350–4.500)

## 2017-02-20 MED ORDER — ALPRAZOLAM 0.25 MG PO TABS: 0.2500 mg | ORAL_TABLET | Freq: Two times a day (BID) | ORAL | Status: DC | PRN

## 2017-02-20 MED ORDER — DILTIAZEM HCL ER COATED BEADS 180 MG PO CP24
180.0000 mg | ORAL_CAPSULE | Freq: Every day | ORAL | Status: DC
  Administered 2017-02-20 – 2017-02-21 (×2): 180 mg via ORAL
  Filled 2017-02-20 (×2): qty 1

## 2017-02-20 MED ORDER — ALLOPURINOL 300 MG PO TABS
300.0000 mg | ORAL_TABLET | Freq: Every day | ORAL | Status: DC
  Administered 2017-02-21: 300 mg via ORAL
  Filled 2017-02-20: qty 1

## 2017-02-20 MED ORDER — FUROSEMIDE 20 MG PO TABS: 40.0000 mg | ORAL_TABLET | Freq: Every day | ORAL | Status: DC

## 2017-02-20 MED ORDER — ZOLPIDEM TARTRATE 5 MG PO TABS: 5.0000 mg | ORAL_TABLET | Freq: Every evening | ORAL | Status: DC | PRN

## 2017-02-20 MED ORDER — SODIUM CHLORIDE 0.9% FLUSH
3.0000 mL | Freq: Two times a day (BID) | INTRAVENOUS | Status: DC
  Administered 2017-02-20: 3 mL via INTRAVENOUS

## 2017-02-20 MED ORDER — SODIUM CHLORIDE 0.9% FLUSH
3.0000 mL | INTRAVENOUS | Status: DC | PRN
  Administered 2017-02-21: 3 mL via INTRAVENOUS
  Filled 2017-02-20: qty 3

## 2017-02-20 MED ORDER — DILTIAZEM HCL 100 MG IV SOLR
5.0000 mg/h | INTRAVENOUS | Status: DC
  Administered 2017-02-20: 5 mg/h via INTRAVENOUS
  Filled 2017-02-20: qty 100

## 2017-02-20 MED ORDER — MONTELUKAST SODIUM 10 MG PO TABS
10.0000 mg | ORAL_TABLET | Freq: Every day | ORAL | Status: DC
  Administered 2017-02-20: 10 mg via ORAL
  Filled 2017-02-20: qty 1

## 2017-02-20 MED ORDER — CIPROFLOXACIN HCL 500 MG PO TABS
500.0000 mg | ORAL_TABLET | Freq: Two times a day (BID) | ORAL | Status: DC
  Administered 2017-02-20 – 2017-02-21 (×2): 500 mg via ORAL
  Filled 2017-02-20 (×2): qty 1

## 2017-02-20 MED ORDER — PANTOPRAZOLE SODIUM 40 MG PO TBEC
40.0000 mg | DELAYED_RELEASE_TABLET | Freq: Every day | ORAL | Status: DC
  Administered 2017-02-21: 40 mg via ORAL
  Filled 2017-02-20: qty 1

## 2017-02-20 MED ORDER — TIOTROPIUM BROMIDE MONOHYDRATE 18 MCG IN CAPS
18.0000 ug | ORAL_CAPSULE | Freq: Every day | RESPIRATORY_TRACT | Status: DC
  Administered 2017-02-21: 18 ug via RESPIRATORY_TRACT
  Filled 2017-02-20: qty 5

## 2017-02-20 MED ORDER — POTASSIUM CHLORIDE CRYS ER 20 MEQ PO TBCR
20.0000 meq | EXTENDED_RELEASE_TABLET | Freq: Two times a day (BID) | ORAL | Status: DC
  Administered 2017-02-21: 20 meq via ORAL
  Filled 2017-02-20: qty 1

## 2017-02-20 MED ORDER — ACETAMINOPHEN 325 MG PO TABS: 650.0000 mg | ORAL_TABLET | ORAL | Status: DC | PRN

## 2017-02-20 MED ORDER — FUROSEMIDE 10 MG/ML IJ SOLN
40.0000 mg | Freq: Two times a day (BID) | INTRAMUSCULAR | Status: DC
  Administered 2017-02-21: 40 mg via INTRAVENOUS
  Filled 2017-02-20: qty 4

## 2017-02-20 MED ORDER — NITROGLYCERIN 0.4 MG SL SUBL: 0.4000 mg | SUBLINGUAL_TABLET | SUBLINGUAL | Status: DC | PRN

## 2017-02-20 MED ORDER — ATORVASTATIN CALCIUM 20 MG PO TABS: 20.0000 mg | ORAL_TABLET | Freq: Every day | ORAL | Status: DC

## 2017-02-20 MED ORDER — ENOXAPARIN SODIUM 40 MG/0.4ML ~~LOC~~ SOLN
40.0000 mg | SUBCUTANEOUS | Status: DC
  Administered 2017-02-20: 40 mg via SUBCUTANEOUS
  Filled 2017-02-20: qty 0.4

## 2017-02-20 MED ORDER — ASPIRIN EC 81 MG PO TBEC
81.0000 mg | DELAYED_RELEASE_TABLET | Freq: Every day | ORAL | Status: DC
  Administered 2017-02-21: 81 mg via ORAL
  Filled 2017-02-20: qty 1

## 2017-02-20 MED ORDER — MOMETASONE FURO-FORMOTEROL FUM 200-5 MCG/ACT IN AERO
2.0000 | INHALATION_SPRAY | Freq: Two times a day (BID) | RESPIRATORY_TRACT | Status: DC
  Administered 2017-02-21: 2 via RESPIRATORY_TRACT
  Filled 2017-02-20: qty 8.8

## 2017-02-20 MED ORDER — DILTIAZEM LOAD VIA INFUSION
10.0000 mg | Freq: Once | INTRAVENOUS | Status: AC
  Administered 2017-02-20: 10 mg via INTRAVENOUS
  Filled 2017-02-20: qty 10

## 2017-02-20 MED ORDER — FESOTERODINE FUMARATE ER 4 MG PO TB24
4.0000 mg | ORAL_TABLET | Freq: Every day | ORAL | Status: DC
  Administered 2017-02-21: 4 mg via ORAL
  Filled 2017-02-20: qty 1

## 2017-02-20 MED ORDER — SODIUM CHLORIDE 0.9 % IV BOLUS (SEPSIS)
250.0000 mL | Freq: Once | INTRAVENOUS | Status: AC
  Administered 2017-02-20: 250 mL via INTRAVENOUS

## 2017-02-20 MED ORDER — LORATADINE 10 MG PO TABS
10.0000 mg | ORAL_TABLET | Freq: Every day | ORAL | Status: DC
  Administered 2017-02-21: 10 mg via ORAL
  Filled 2017-02-20: qty 1

## 2017-02-20 MED ORDER — METOPROLOL SUCCINATE ER 25 MG PO TB24
25.0000 mg | ORAL_TABLET | Freq: Every day | ORAL | Status: DC
  Administered 2017-02-21: 25 mg via ORAL
  Filled 2017-02-20: qty 1

## 2017-02-20 MED ORDER — ONDANSETRON HCL 4 MG/2ML IJ SOLN: 4.0000 mg | Freq: Four times a day (QID) | INTRAMUSCULAR | Status: DC | PRN

## 2017-02-20 MED ORDER — SODIUM CHLORIDE 0.9 % IV SOLN: 250.0000 mL | INTRAVENOUS | Status: DC | PRN

## 2017-02-20 NOTE — Telephone Encounter (Signed)
New message   Patient states he was told to go to ER yesterday, but did not go. Please call at [phone]  STAT if HR is under 50 or over 120 (normal HR is 60-100 beats per minute)  1) What is your heart rate? 145  2) Do you have a log of your heart rate readings (document readings)? 145, 152  3) Do you have any other symptoms? SOB when walking

## 2017-02-20 NOTE — ED Notes (Signed)
Chest xray at bedside.

## 2017-02-20 NOTE — Telephone Encounter (Signed)
Returned call to patient who c/o fast heart rate & SOB. This was occurring yesterday and Malachy Mood, LPN advised hospital eval which patient did not go. Informed him that if his HR is in 140s-150s and with h/o SVT, atrial flutter/atrial tach per last MD note, he should seek eval at [facility]. Notified him will call nurse first at [facility] ED and he should have someone take him to hospital. He states he plans to get to hospital no later than 10am.

## 2017-02-20 NOTE — H&P (Signed)
Cardiology Admission History and Physical:   Patient ID: Jay [name]; MRN: [id number]; DOB: 1951-02-03   Admission date: 02/20/2017  Primary Care Provider: Cathlean Sauer, MD Primary Cardiologist: Dr Sallyanne Kuster EP- Dr Lovena Le saw the pt in 2016 for NSVT and syncope  Chief Complaint:  Tachycardia  Patient Profile:   Jay [name] is a 66 y.o.  Seen in the ED with A atrial flutter and CHF.    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 He has a history of chronic combined CHF with a EF 40-45% Sept 2018 and grade 1 DD. He has significant  COPD, and OSA on C-pap. In 2017 he had some problems with anemia and a GI work up by Dr Benson Norway revealed Crohn's colitis. He was placed on steroids. In Sept 2017 he had severe anemia and he required transfusion.  He saw Der Bahar Shelden in the office 01/04/17 and was noted to be in atrial flutter vs MAT. Dr Sallyanne Kuster recommend starting anticoagulation and setting up TEE guided cardioversion. The pt was reluctant to start this secondary to his severe anemia requiring transfusion in 2017. Dr Sallyanne Kuster felt that in the long run he'd probably do best with radiofrequency ablation, because of his risk for recurrent bleeding secondary to Crohn's. He saw Dre Crenshaw 48 hrs later in follow up (01/06/17) and the pt was ion NSR. Low dose beta blocker was added, he hasn't tolerated high doses in the past. An echo was done 01/15/17 and was unchanged from previous echo's- ERF 40-45%.  The pt had been doing well. This past Fridfay he received his flu shot. Saturday he did not feel well, slightly more SOB than usual and had vague SSCP-"pressure". Sunday he checked his O2 sat which was fine but his HR was 140. He declined to come tot the ED thinking it would again resolve on its own but today he again noted HR 140's  and was instructed to come to the ED by the office. He has also noted a 10 lb wgt gain to 240 lbs. He denies palpitations but admits to a generalized feeling of "not getting enough air". In the ED he was in A flutter with 2:1 conduction at 147. His rate is now 100 on IV Diltiazem.    Past Medical History:  Diagnosis Date  . Arthritis   . Borderline diabetes   . Cancer [facility]) 2008   Prostate cancer- s/p Rob. prostate surgery '08  . COPD (chronic obstructive pulmonary disease) ([place])   . Coronary artery disease   . Crohn's disease ([place])   . Dysrhythmia    ? near syncope episodes, arhythmia -not proven cause- symptoms had stop when monitored with continuous rhythm monitor.  [name] GERD (gastroesophageal reflux disease)   . History of home oxygen therapy    2 liters prn and at hs  . Hyperlipidemia   . Hypertension   . Ischemic cardiomyopathy    Ejection fraction 40-45%  . Myocardial infarction [facility]) 2008   x1  . Obesity, Class II, BMI 35-39.9   . Obstructive sleep apnea on CPAP    uses cpap setting of 10  . Psoriasis   . S/P CABG x 4 2007   Dr.  [name]   . Tobacco abuse   . Transfusion history    x 1  -s/p  CABPG    Past Surgical History:  Procedure Laterality Date  . CARDIAC CATHETERIZATION  07/04/2006   significant 3 vessel disease: totally vein graft to the ramus intermedius,toalled vein graft to the obtuse marginal, mildly diseased vein graft of the posterior descending artery, mildly depressed LV systolic fx.  . COLONOSCOPY WITH PROPOFOL N/A 10/28/2015   Procedure: COLONOSCOPY WITH PROPOFOL;  Surgeon: Carol Ada, MD;  Location: [facility] ENDOSCOPY;  Service: Endoscopy;  Laterality: N/A;  . COLONOSCOPY WITH PROPOFOL N/A 02/17/2016   Procedure: COLONOSCOPY WITH PROPOFOL;  Surgeon: Carol Ada, MD;  Location: [facility] ENDOSCOPY;  Service: Endoscopy;  Laterality: N/A;  . CORONARY ARTERY BYPASS GRAFT  02/14/2006   LIMA to LAD,SVG to posterior descending,SVG to CX,SVG to ramus intermediate.  .  CYSTOSCOPY    . ESOPHAGOGASTRODUODENOSCOPY (EGD) WITH PROPOFOL N/A 10/28/2015   Procedure: ESOPHAGOGASTRODUODENOSCOPY (EGD) WITH PROPOFOL;  Surgeon: Carol Ada, MD;  Location: [facility] ENDOSCOPY;  Service: Endoscopy;  Laterality: N/A;  . XI ROBOTIC ASSISTED SIMPLE PROSTATECTOMY       Medications Prior to Admission: Prior to Admission medications   Medication Sig Start Date End Date Taking? Authorizing Provider  acetaminophen (TYLENOL) 650 MG CR tablet Take 650 mg by mouth every 8 (eight) hours as needed for pain.   Yes [provider]  albuterol (PROAIR HFA) 108 (90 BASE) MCG/ACT inhaler Inhale 1 puff into the lungs daily. And prn   Yes [provider]  albuterol (PROVENTIL) (2.5 MG/3ML) 0.083% nebulizer solution Take 2.5 mg by nebulization every 4 (four) hours as needed for wheezing.   Yes [provider]  allopurinol (ZYLOPRIM) 300 MG tablet Take 300 mg by mouth daily.   Yes [provider]  atorvastatin (LIPITOR) 20 MG tablet Take 20 mg by mouth daily.    Yes [provider]  cetirizine (ZYRTEC) 10 MG tablet Take 10 mg by mouth daily.   Yes [provider]  ciprofloxacin (CIPRO) 500 MG tablet Take 500 mg by mouth 3 (three) times daily.   Yes [provider]  Cyanocobalamin (VITAMIN B 12) 100 MCG LOZG Take 2,500 mcg by mouth daily.    Yes [provider]  Fluticasone-Salmeterol (ADVAIR) 250-50 MCG/DOSE AEPB Inhale 1 puff into the lungs daily.   Yes [provider]  furosemide (LASIX) 80 MG tablet TAKE 1 TABLET DAILY Patient taking differently: 80mg  by mouth once daily 02/11/17  Yes Crenshaw, Denice Bors, MD  ipratropium-albuterol (DUONEB) 0.5-2.5 (3) MG/3ML SOLN Inhale 3 mLs into the lungs every 6 (six) hours as needed (wheezing or SOB).    Yes [provider]  metoprolol succinate (TOPROL XL) 25 MG 24 hr tablet Take 0.5 tablets (12.5 mg total) by mouth daily. 01/07/17  Yes Lelon Perla, MD  montelukast  (SINGULAIR) 10 MG tablet Take 10 mg by mouth at bedtime.   Yes [provider]  omeprazole (PRILOSEC) 40 MG capsule Take 40 mg by mouth daily.  12/07/15  Yes [provider]  potassium chloride (KLOR-CON M10) 10 MEQ tablet Take 2 tablets (20 mEq total) by mouth 2 (two) times daily. 09/30/14  Yes Hilty, Nadean Corwin, MD  tiotropium (SPIRIVA HANDIHALER) 18 MCG inhalation capsule Place 1 Inhaler into inhaler and inhale daily.   Yes [provider]  tolterodine (DETROL LA) 4 MG 24 hr capsule Take 4 mg by mouth daily.   Yes [provider]  Urea 50 % CREA Apply 1 application topically daily as needed (psoriasis).  03/18/14  Yes [provider]  ustekinumab Delsa Grana)  90 MG/ML SOSY injection Inject 90 mg into the skin every 8 (eight) weeks.    Yes [provider]  zolpidem (AMBIEN) 10 MG tablet Take 10 mg by mouth at bedtime as needed for sleep.  08/03/13  Yes [provider]  amoxicillin-clavulanate (AUGMENTIN) 875-125 MG tablet Take 1 tablet by mouth 2 (two) times daily. Patient not taking: Reported on 02/20/2017 06/26/16   Mirca Yale, MD  metolazone (ZAROXOLYN) 2.5 MG tablet take 1 tablet by mouth 30 MINUTES PRIOR TO FUROSEMIDE IF WEIGHT IS GREATER THAN 240 LBS Patient not taking: Reported on 02/20/2017 12/12/15   Massey Ruhland, Dani Gobble, MD     Allergies:    Allergies  Allergen Reactions  . Lipitor [Atorvastatin] Other (See Comments)    Myalgia  . Angiotensin Receptor Blockers Other (See Comments)    Hyperkalemia    Social History:   Social History   Social History  . Marital status: Married    Spouse name: N/A  . Number of children: N/A  . Years of education: N/A   Occupational History  . Not on file.   Social History Main Topics  . Smoking status: Former Smoker    Packs/day: 2.00    Years: 30.00    Types: Cigarettes    Quit date: 04/30/2000  . Smokeless tobacco: Never Used  . Alcohol use 0.0 oz/week     Comment: occ. to rare  .  Drug use: No  . Sexual activity: Not on file   Other Topics Concern  . Not on file   Social History Narrative  . No narrative on file    Family History:   The patient's family history includes Heart attack in his father.    ROS:  Please see the history of present illness.   Pt is scheduled to have a knee replacement Monday in [facility] All other ROS reviewed and negative.     Physical Exam/Data:   Vitals:   02/20/17 1245 02/20/17 1300 02/20/17 1330 02/20/17 1345  BP: 105/84 105/78 109/69 118/61  Pulse: 67   71  Resp: 15 19 19 16   Temp:      TempSrc:      SpO2: 92% 94% 92% 96%  Weight:      Height:       No intake or output data in the 24 hours ending 02/20/17 1405 Filed Weights   02/20/17 1017  Weight: 245 lb (111.1 kg)   Body mass index is 36.18 kg/m.  General:  Morbidly obese in no acute distress HEENT: normal, glasses Lymph: no adenopathy Neck: no JVD Endocrine:  No thryomegaly Vascular: No carotid bruits; FA pulses 2+ bilaterally without bruits  Cardiac:  Irregularly irregular, decreased heart sounds Lungs:  Decreased breath sounds c/w COPD Abd: soft, nontender, no hepatomegaly  Ext: 1+ edema Musculoskeletal:  No deformities, BUE and BLE strength normal and equal Skin: warm and dry  Neuro:  CNs 2-12 intact, no focal abnormalities noted Psych:  Normal affect    EKG:  The ECG that was done 02/20/17 was personally reviewed and demonstrates atrial flutter with 2:1 conduction  Relevant CV Studies:  Echo 01/15/17- Study Conclusions  - Left ventricle: The cavity size was moderately dilated. Wall   thickness was increased in a pattern of mild LVH. Systolic   function was mildly to moderately reduced. The estimated ejection   fraction was in the range of 40% to 45%. Diffuse hypokinesis.   Doppler parameters are consistent with abnormal left ventricular   relaxation (grade  1 diastolic dysfunction). Doppler parameters   are consistent with high ventricular  filling pressure. - Aortic valve: There was trivial regurgitation. - Left atrium: The atrium was mildly dilated. - Right ventricle: The cavity size was mildly dilated. - Right atrium: The atrium was mildly dilated.  Impressions:  - Mild to moderate global reduction in LV systolic function; mild   diastolic dysfunction with elevated LV fillin pressure; moderate   LVE; mild LVH; mild biatrial enlargement; mild RVE.  Laboratory Data:  Chemistry Recent Labs Lab 02/20/17 1028  NA 138  K 4.5  CL 100*  CO2 29  GLUCOSE 123*  BUN 13  CREATININE 1.03  CALCIUM 9.3  GFRNONAA >60  GFRAA >60  ANIONGAP 9    No results for input(s): PROT, ALBUMIN, AST, ALT, ALKPHOS, BILITOT in the last 168 hours. Hematology Recent Labs Lab 02/20/17 1028  WBC 8.5  RBC 3.99*  HGB 11.8*  HCT 38.8*  MCV 97.2  MCH 29.6  MCHC 30.4  RDW 15.1  PLT 218   Cardiac EnzymesNo results for input(s): TROPONINI in the last 168 hours.  Recent Labs Lab 02/20/17 1043  TROPIPOC 0.08    BNPNo results for input(s): BNP, PROBNP in the last 168 hours.  DDimer No results for input(s): DDIMER in the last 168 hours.  Radiology/Studies:  Dg Chest Portable 1 View  Result Date: 02/20/2017 CLINICAL DATA:  67 year old male with increased heart rate and shortness of breath for the past 4 days. EXAM: PORTABLE CHEST 1 VIEW COMPARISON:  Chest x-ray 02/15/2015. FINDINGS: Lung volumes are normal. No consolidative airspace disease. No pleural effusions. Mild linear scarring in the mid lungs bilaterally, similar to the prior examinations. Mild pulmonary venous congestion, without frank pulmonary edema. Mild cardiomegaly. Upper mediastinal contours are within normal limits. Aortic atherosclerosis. Status post median sternotomy for CABG. Several broken upper median sternotomy wires are again noted. IMPRESSION: 1. No radiographic evidence of acute cardiopulmonary disease. 2. Cardiomegaly. 3. Aortic atherosclerosis. Electronically  Signed   By: Vinnie Langton M.D.   On: 02/20/2017 11:54    Assessment and Plan:   Recurrent atrial flutter with 2:1 conduction- Rate improved on IV Diltiazem. It sounds like he may have been in AF for the past few days (> 48 hrs). Anticoagulation is an issue secondary to h/o GI bleed, colitis requiring transfusion.   Acute on chronic combined systolic and diastolic CHF (congestive heart failure) (HCC) wgt up 10 lbs from baseline- check BNP, diurese  Cardiomyopathy, ischemic, ejection fraction 40-45% EF 40-45% by echo Sept 2018  S/P CABG x 4, 2007 CABG x 4 2007, cath 2008- occluded SVG-OM and RI, Myoview low risk 2016- medical Rx  Crohn's colitis [facility]) Diagnosed June 2017, transfused Sept 2017-Dr Hung  COPD- pt is also morbidly obese and has sleep apnea, C-pap compliant  Plan- Admit, IV Lasix, IV Diltiazem, I increased Toprol to 25 mg daily. Will discuss short term anticoagulation with MD. Consider EP evaluation once pt is stable.   NOTE- pt is supposed to have a knee replacement Monday (Dr Davina Poke in [facility]) and would like to proceed with this if he happens to convert to NSR spontaneously or we feel he is stable with rate control. He would then consider long term plans (RFA if indicated and short term anticoagulation) as an OP.  He is also on Cipro x 7 days per his PCP for "UTI"-started 10/22.     For questions or updates, please contact [place] Please consult [email or web address] for contact info under  Cardiology/STEMI.    Angelena Form, PA-C  02/20/2017 2:05 PM   Severity of Illness: The appropriate patient status for this patient is OBSERVATION. Observation status is judged to be reasonable and necessary in order to provide the required intensity of service to ensure the patient's safety. The patient's presenting symptoms, physical exam findings, and initial radiographic and laboratory data in the context of their medical condition is felt to place them at  decreased risk for further clinical deterioration. Furthermore, it is anticipated that the patient will be medically stable for discharge from the hospital within 2 midnights of admission. The following factors support the patient status of observation.   " The patient's presenting symptoms include CHF. " The physical exam findings include tachycardia. " The initial radiographic and laboratory data are normal.  I have seen and examined the patient along with Kerin Ransom, [place].  I have reviewed the chart, notes and new data.  I agree with PA's note.  Key new complaints: Feels greatly improved with adequate rate control. Heart rate now in the 80s on intravenous diltiazem 5 mg/h (in addition to oral metoprolol which he takes on a regular basis). Is to be tolerating this for blood pressure standpoint. Key examination changes: Irregular rhythm, no overt signs of hypervolemia by physical exam Key new findings / data: No evidence of electrolyte abnormalities that could precipitate the arrhythmia  PLAN: Once adequate rate control was provided he seems to tolerate the arrhythmia well. Will stop intravenous diltiazem and start sustained release diltiazem 180 mg. Continue metoprolol. Observe overnight to make sure that he remains asymptomatic, does not have worsening heart failure, maintains adequate heart rate and blood pressure on the planned medical regimen. He would prefer to go ahead with the planned knee replacement surgery next Monday with Dr. Davina Poke. I think this is reasonable as long as his ventricular rate remains well controlled and he feels well. We reviewed the need for stroke protection with long-term anticoagulation. Clearly this arrhythmia will recur with increasing frequency. After he has his knee surgery in a few days, would start on full dose anticoagulation with either eloquent sore Xarelto. He had GI bleeding problems and required transfusion when he had poorly controlled inflammatory  colitis and was taking anticoagulation. Currently, his GI symptoms are very well controlled and he has not noticed any rectal bleeding in many months. Hopefully, he will now be able to tolerate anticoagulation without developing anemia.  [name], MD, [place] [phone] 02/20/2017, 4:58 PM.

## 2017-02-20 NOTE — ED Notes (Signed)
Lunch tray ordered; heart healthy, carb modified diet 

## 2017-02-20 NOTE — ED Notes (Signed)
Cardiology at bedside.

## 2017-02-20 NOTE — ED Notes (Signed)
Heart healthy requested from [facility]

## 2017-02-20 NOTE — ED Notes (Signed)
Report attempted x 1

## 2017-02-20 NOTE — Telephone Encounter (Signed)
Patient went to [facility] ED as recommended

## 2017-02-20 NOTE — ED Provider Notes (Signed)
[place] EMERGENCY DEPARTMENT Provider Note   CSN: [id number] Arrival date & time: 02/20/17  1006    History   Chief Complaint Chief Complaint  Patient presents with  . Tachycardia    HPI Jay [name] is a 66 y.o. male with history of CAD s/p CABG, ICM EF 40%, HTN, HLD, COPD, and hx dysrhythmia (AFlutter vs EAT) who presents to the ED with tachycardia. Patient reports four-day history of tachycardia at home. States he has been doing well and thought it was going to resolve spontaneously and decided not to come to the hospital. He called his cardiologist 2 days ago recommended coming to the ED however patient declined. He is now feeling fatigued, tired, and winded. Called his cardiologist again this morning again recommended coming to the ED. States he does not have chest pain or palpitations. Denies shortness of breath at rest. No nausea, vomiting, or diaphoresis. He does report new lower extremity swelling. Patient states he had a similar episode of tachycardia about 2 months ago that resolved. Seen by Dr. Stanford Breed 9/10 at which time ablation was discussed. He was also started on metoprolol 12.5 MG daily. Patient follows up with Dr. Sallyanne Kuster.   HPI  Past Medical History:  Diagnosis Date  . Arthritis   . Borderline diabetes   . Cancer [facility]) 2008   Prostate cancer- s/p Rob. prostate surgery '08  . COPD (chronic obstructive pulmonary disease) ([place])   . Coronary artery disease   . Crohn's disease ([place])   . Dysrhythmia    ? near syncope episodes, arhythmia -not proven cause- symptoms had stop when monitored with continuous rhythm monitor.  [name] GERD (gastroesophageal reflux disease)   . History of home oxygen therapy    2 liters prn and at hs  . Hyperlipidemia   . Hypertension   . Ischemic cardiomyopathy    Ejection fraction 40-45%  . Myocardial infarction [facility]) 2008   x1  . Obesity, Class II, BMI 35-39.9   . Obstructive sleep apnea on CPAP    uses cpap  setting of 10  . Psoriasis   . S/P CABG x 4 2007   Dr.  [name]   . Tobacco abuse   . Transfusion history    x 1  -s/p CABPG    Patient Active Problem List   Diagnosis Date Noted  . Atrial flutter ([place]) 02/20/2017  . Anemia 03/13/2016  . Crohn's colitis ([place]) 03/13/2016  . NSVT (nonsustained ventricular tachycardia) ([place]) 10/13/2014  . COPD (chronic obstructive pulmonary disease) ([place]) 06/21/2014  . Acute on chronic combined systolic and diastolic CHF (congestive heart failure) ([place]) 02/24/2013  . Cardiomyopathy, ischemic, ejection fraction 40-45% 02/24/2013  . Mild obesity 02/24/2013  . Hyperlipidemia 02/24/2013  . S/P CABG x 4, 2007 02/24/2013  . Sleep apnea 02/24/2013  . History of tobacco use 02/24/2013    Past Surgical History:  Procedure Laterality Date  . CARDIAC CATHETERIZATION  07/04/2006   significant 3 vessel disease: totally vein graft to the ramus intermedius,toalled vein graft to the obtuse marginal, mildly diseased vein graft of the posterior descending artery, mildly depressed LV systolic fx.  . COLONOSCOPY WITH PROPOFOL N/A 10/28/2015   Procedure: COLONOSCOPY WITH PROPOFOL;  Surgeon: Carol Ada, MD;  Location: [facility] ENDOSCOPY;  Service: Endoscopy;  Laterality: N/A;  . COLONOSCOPY WITH PROPOFOL N/A 02/17/2016   Procedure: COLONOSCOPY WITH PROPOFOL;  Surgeon: Carol Ada, MD;  Location: [facility] ENDOSCOPY;  Service: Endoscopy;  Laterality: N/A;  . CORONARY ARTERY BYPASS GRAFT  02/14/2006   LIMA to LAD,SVG to posterior descending,SVG to CX,SVG to ramus intermediate.  . CYSTOSCOPY    . ESOPHAGOGASTRODUODENOSCOPY (EGD) WITH PROPOFOL N/A 10/28/2015   Procedure: ESOPHAGOGASTRODUODENOSCOPY (EGD) WITH PROPOFOL;  Surgeon: Carol Ada, MD;  Location: [facility] ENDOSCOPY;  Service: Endoscopy;  Laterality: N/A;  . XI ROBOTIC ASSISTED SIMPLE PROSTATECTOMY         Home Medications    Prior to Admission medications   Medication Sig Start Date End Date Taking? Authorizing Provider    acetaminophen (TYLENOL) 650 MG CR tablet Take 650 mg by mouth every 8 (eight) hours as needed for pain.   Yes [provider]  albuterol (PROAIR HFA) 108 (90 BASE) MCG/ACT inhaler Inhale 1 puff into the lungs daily. And prn   Yes [provider]  albuterol (PROVENTIL) (2.5 MG/3ML) 0.083% nebulizer solution Take 2.5 mg by nebulization every 4 (four) hours as needed for wheezing.   Yes [provider]  allopurinol (ZYLOPRIM) 300 MG tablet Take 300 mg by mouth daily.   Yes [provider]  atorvastatin (LIPITOR) 20 MG tablet Take 20 mg by mouth daily.    Yes [provider]  cetirizine (ZYRTEC) 10 MG tablet Take 10 mg by mouth daily.   Yes [provider]  ciprofloxacin (CIPRO) 500 MG tablet Take 500 mg by mouth 3 (three) times daily.   Yes [provider]  Cyanocobalamin (VITAMIN B 12) 100 MCG LOZG Take 2,500 mcg by mouth daily.    Yes [provider]  Fluticasone-Salmeterol (ADVAIR) 250-50 MCG/DOSE AEPB Inhale 1 puff into the lungs daily.   Yes [provider]  furosemide (LASIX) 80 MG tablet TAKE 1 TABLET DAILY Patient taking differently: 80mg  by mouth once daily 02/11/17  Yes Crenshaw, Denice Bors, MD  ipratropium-albuterol (DUONEB) 0.5-2.5 (3) MG/3ML SOLN Inhale 3 mLs into the lungs every 6 (six) hours as needed (wheezing or SOB).    Yes [provider]  metoprolol succinate (TOPROL XL) 25 MG 24 hr tablet Take 0.5 tablets (12.5 mg total) by mouth daily. 01/07/17  Yes Lelon Perla, MD  montelukast (SINGULAIR) 10 MG tablet Take 10 mg by mouth at bedtime.   Yes [provider]  omeprazole (PRILOSEC) 40 MG capsule Take 40 mg by mouth daily.  12/07/15  Yes [provider]  potassium chloride (KLOR-CON M10) 10 MEQ tablet Take 2 tablets (20 mEq total) by mouth 2 (two) times daily. 09/30/14  Yes Hilty, Nadean Corwin, MD  tiotropium (SPIRIVA HANDIHALER) 18 MCG inhalation capsule Place 1 Inhaler into inhaler  and inhale daily.   Yes [provider]  tolterodine (DETROL LA) 4 MG 24 hr capsule Take 4 mg by mouth daily.   Yes [provider]  Urea 50 % CREA Apply 1 application topically daily as needed (psoriasis).  03/18/14  Yes [provider]  ustekinumab (STELARA) 90 MG/ML SOSY injection Inject 90 mg into the skin every 8 (eight) weeks.    Yes [provider]  zolpidem (AMBIEN) 10 MG tablet Take 10 mg by mouth at bedtime as needed for sleep.  08/03/13  Yes [provider]  amoxicillin-clavulanate (AUGMENTIN) 875-125 MG tablet Take 1 tablet by mouth 2 (two) times daily. Patient not taking: Reported on 02/20/2017 06/26/16   Croitoru, Mihai, MD  metolazone (ZAROXOLYN) 2.5 MG tablet take 1 tablet by mouth 30 MINUTES PRIOR TO FUROSEMIDE IF WEIGHT IS GREATER THAN 240 LBS Patient not taking: Reported on 02/20/2017 12/12/15   Croitoru, Dani Gobble, MD  Family History Family History  Problem Relation Age of Onset  . Heart attack Father     Social History Social History  Substance Use Topics  . Smoking status: Former Smoker    Packs/day: 2.00    Years: 30.00    Types: Cigarettes    Quit date: 04/30/2000  . Smokeless tobacco: Never Used  . Alcohol use 0.0 oz/week     Comment: occ. to rare     Allergies   Lipitor [atorvastatin] and Angiotensin receptor blockers   Review of Systems Review of Systems  Constitutional: Positive for activity change and fatigue. Negative for appetite change, chills, diaphoresis and fever.  Respiratory: Positive for shortness of breath. Negative for cough, chest tightness and wheezing.   Cardiovascular: Positive for leg swelling. Negative for chest pain and palpitations.  Gastrointestinal: Negative for abdominal distention, abdominal pain, blood in stool, constipation, diarrhea, nausea and vomiting.  Neurological: Negative for dizziness, weakness, light-headedness and headaches.  All other systems reviewed and are  negative.    Physical Exam Updated Vital Signs BP 118/76   Pulse 86   Temp 97.6 F (36.4 C) (Oral)   Resp 17   Ht 5\' 9"  (1.753 m)   Wt 111.1 kg (245 lb)   SpO2 96%   BMI 36.18 kg/m   Physical Exam  Constitutional: He is oriented to person, place, and time. He appears well-developed and well-nourished. No distress.  HENT:  Head: Normocephalic and atraumatic.  Nose: Nose normal.  Mouth/Throat: Oropharynx is clear and moist. No oropharyngeal exudate.  Eyes: Pupils are equal, round, and reactive to light. Conjunctivae and EOM are normal.  Neck: Normal range of motion. Neck supple. No JVD present.  Cardiovascular:  Irregularly irregular, no murmurs rubs or gallops noted  Pulmonary/Chest:  Decreased breath sounds throughout with mild bibasilar crackling noted, no increased work of breathing   Abdominal: Soft. Bowel sounds are normal. He exhibits no distension. There is no tenderness. There is no rebound and no guarding.  Musculoskeletal: He exhibits edema.  There is 1+ lower extremity pitting edema bilaterally  Neurological: He is alert and oriented to person, place, and time.  Skin: Skin is warm and dry. Capillary refill takes less than 2 seconds. He is not diaphoretic.     ED Treatments / Results  Labs (all labs ordered are listed, but only abnormal results are displayed) Labs Reviewed  BASIC METABOLIC PANEL - Abnormal; Notable for the following:       Result Value   Chloride 100 (*)    Glucose, Bld 123 (*)    All other components within normal limits  CBC - Abnormal; Notable for the following:    RBC 3.99 (*)    Hemoglobin 11.8 (*)    HCT 38.8 (*)    All other components within normal limits  HIV ANTIBODY (ROUTINE TESTING)  CBC  CREATININE, SERUM  TSH  HEMOGLOBIN A1C  MAGNESIUM  BRAIN NATRIURETIC PEPTIDE  I-STAT TROPONIN, ED    EKG  EKG Interpretation  Date/Time:  Wednesday February 20 2017 10:06:11 EDT Ventricular Rate:  147 PR Interval:  126 QRS  Duration: 120 QT Interval:  272 QTC Calculation: 425 R Axis:   -129 Text Interpretation:  Narrow QRS tachycardia Nonspecific T wave abnormality Confirmed by Lajean Saver [phone]) on 02/20/2017 11:07:40 AM       Radiology Dg Chest Portable 1 View  Result Date: 02/20/2017 CLINICAL DATA:  66 year old male with increased heart rate and shortness of breath for the past 4 days. EXAM:  PORTABLE CHEST 1 VIEW COMPARISON:  Chest x-ray 02/15/2015. FINDINGS: Lung volumes are normal. No consolidative airspace disease. No pleural effusions. Mild linear scarring in the mid lungs bilaterally, similar to the prior examinations. Mild pulmonary venous congestion, without frank pulmonary edema. Mild cardiomegaly. Upper mediastinal contours are within normal limits. Aortic atherosclerosis. Status post median sternotomy for CABG. Several broken upper median sternotomy wires are again noted. IMPRESSION: 1. No radiographic evidence of acute cardiopulmonary disease. 2. Cardiomegaly. 3. Aortic atherosclerosis. Electronically Signed   By: Vinnie Langton M.D.   On: 02/20/2017 11:54    Procedures Procedures (including critical care time)  Medications Ordered in ED Medications  diltiazem (CARDIZEM) 1 mg/mL load via infusion 10 mg (10 mg Intravenous Bolus from Bag 02/20/17 1229)    And  diltiazem (CARDIZEM) 100 mg in dextrose 5 % 100 mL (1 mg/mL) infusion (5 mg/hr Intravenous New Bag/Given 02/20/17 1229)  aspirin EC tablet 81 mg (not administered)  nitroGLYCERIN (NITROSTAT) SL tablet 0.4 mg (not administered)  acetaminophen (TYLENOL) tablet 650 mg (not administered)  ondansetron (ZOFRAN) injection 4 mg (not administered)  enoxaparin (LOVENOX) injection 40 mg (not administered)  sodium chloride flush (NS) 0.9 % injection 3 mL (not administered)  sodium chloride flush (NS) 0.9 % injection 3 mL (not administered)  0.9 %  sodium chloride infusion (not administered)  zolpidem (AMBIEN) tablet 5 mg (not administered)   ALPRAZolam (XANAX) tablet 0.25 mg (not administered)  allopurinol (ZYLOPRIM) tablet 300 mg (not administered)  atorvastatin (LIPITOR) tablet 20 mg (not administered)  loratadine (CLARITIN) tablet 10 mg (not administered)  mometasone-formoterol (DULERA) 200-5 MCG/ACT inhaler 2 puff (not administered)  metoprolol succinate (TOPROL-XL) 24 hr tablet 25 mg (not administered)  montelukast (SINGULAIR) tablet 10 mg (not administered)  pantoprazole (PROTONIX) EC tablet 40 mg (not administered)  potassium chloride SA (K-DUR,KLOR-CON) CR tablet 20 mEq (not administered)  tiotropium (SPIRIVA) inhalation capsule 18 mcg (not administered)  fesoterodine (TOVIAZ) tablet 4 mg (not administered)  ciprofloxacin (CIPRO) tablet 500 mg (not administered)  furosemide (LASIX) injection 40 mg (not administered)  furosemide (LASIX) tablet 40 mg (not administered)  sodium chloride 0.9 % bolus 250 mL (0 mLs Intravenous Stopped 02/20/17 1314)     Initial Impression / Assessment and Plan / ED Course  I have reviewed the triage vital signs and the nursing notes.  Pertinent labs & imaging results that were available during my care of the patient were reviewed by me and considered in my medical decision making (see chart for details).  Clinical Course as of Feb 20 1502  Wed Feb 20, 2017  1208 Troponin i, poc: 0.08 [IS]    Clinical Course User Index [IS] Welford Roche, MD    ZAKARIE STURDIVANT is a 66 y.o. male with history of CAD s/p CABG, ICM EF 40%, HTN, HLD, COPD, and hx dysrhythmia (AFlutter vs EAT) who presents to the ED with a four-day history of tachycardia associated with shortness of breath and fatigue. Patient presents with a heart rate in the 140s and hypotensive with BP 100s/70s. States he has been compliant with his home metoprolol. Last took it this morning. Patient appears comfortable exam. He is tachycardic on on exam and has mild right basilar crackles and new lower extremity edema. Initial  i-STAT troponin 0.08 and EKG shows regular, narrow tachycardia that appears to be atrial flutter. CXR mild pulmonary venous congestion, but no frank edema. Cardizem drip started and 277mL NS bolus ordered given low BP. Cardiology consulted for further management.  HR markedly  improved with Cardizem drip. HR 105 and normotensive.   Cardiology will admit patient.  Case discussed with Dr. Ashok Cordia.   Final Clinical Impressions(s) / ED Diagnoses   Final diagnoses:  Tachycardia    New Prescriptions New Prescriptions   No medications on file     Welford Roche, MD 02/20/17 1503    Lajean Saver, MD 02/20/17 [phone]

## 2017-02-20 NOTE — ED Triage Notes (Signed)
Pt to ED c/o sob and hr in 140's since Sunday.  Sob increases with exertion.

## 2017-02-21 ENCOUNTER — Other Ambulatory Visit: Payer: Self-pay

## 2017-02-21 DIAGNOSIS — I110 Hypertensive heart disease with heart failure: Secondary | ICD-10-CM | POA: Diagnosis not present

## 2017-02-21 DIAGNOSIS — E669 Obesity, unspecified: Secondary | ICD-10-CM

## 2017-02-21 DIAGNOSIS — G4733 Obstructive sleep apnea (adult) (pediatric): Secondary | ICD-10-CM

## 2017-02-21 DIAGNOSIS — I484 Atypical atrial flutter: Secondary | ICD-10-CM | POA: Diagnosis not present

## 2017-02-21 DIAGNOSIS — J438 Other emphysema: Secondary | ICD-10-CM

## 2017-02-21 DIAGNOSIS — I5043 Acute on chronic combined systolic (congestive) and diastolic (congestive) heart failure: Secondary | ICD-10-CM | POA: Diagnosis not present

## 2017-02-21 DIAGNOSIS — I255 Ischemic cardiomyopathy: Secondary | ICD-10-CM | POA: Diagnosis not present

## 2017-02-21 DIAGNOSIS — K50111 Crohn's disease of large intestine with rectal bleeding: Secondary | ICD-10-CM

## 2017-02-21 DIAGNOSIS — I4892 Unspecified atrial flutter: Secondary | ICD-10-CM | POA: Diagnosis not present

## 2017-02-21 LAB — COMPREHENSIVE METABOLIC PANEL
ALT: 41 U/L (ref 17–63)
AST: 18 U/L (ref 15–41)
Albumin: 3.4 g/dL — ABNORMAL LOW (ref 3.5–5.0)
Alkaline Phosphatase: 62 U/L (ref 38–126)
Anion gap: 8 (ref 5–15)
BUN: 16 mg/dL (ref 6–20)
CO2: 30 mmol/L (ref 22–32)
Calcium: 9.1 mg/dL (ref 8.9–10.3)
Chloride: 100 mmol/L — ABNORMAL LOW (ref 101–111)
Creatinine, Ser: 1.15 mg/dL (ref 0.61–1.24)
GFR calc Af Amer: >60 mL/min (ref >60)
GFR calc non Af Amer: >60 mL/min (ref >60)
Glucose, Bld: 110 mg/dL — ABNORMAL HIGH (ref 65–99)
Potassium: 4.1 mmol/L (ref 3.5–5.1)
Sodium: 138 mmol/L (ref 135–145)
Total Bilirubin: 0.6 mg/dL (ref 0.3–1.2)
Total Protein: 6.3 g/dL — ABNORMAL LOW (ref 6.5–8.1)

## 2017-02-21 LAB — HIV ANTIBODY (ROUTINE TESTING W REFLEX): HIV Screen 4th Generation wRfx: NONREACTIVE

## 2017-02-21 MED ORDER — FUROSEMIDE 40 MG PO TABS: 40.0000 mg | ORAL_TABLET | Freq: Every day | ORAL | 5 refills | Status: AC

## 2017-02-21 MED ORDER — METOPROLOL SUCCINATE ER 25 MG PO TB24: 25.0000 mg | ORAL_TABLET | Freq: Every day | ORAL | 5 refills | Status: AC

## 2017-02-21 MED ORDER — DILTIAZEM HCL ER COATED BEADS 180 MG PO CP24: 180.0000 mg | ORAL_CAPSULE | Freq: Every day | ORAL | 5 refills | Status: DC

## 2017-02-21 MED ORDER — METHYLPREDNISOLONE SODIUM SUCC 125 MG IJ SOLR
60.0000 mg | Freq: Once | INTRAMUSCULAR | Status: AC
  Administered 2017-02-21: 60 mg via INTRAVENOUS
  Filled 2017-02-21: qty 2

## 2017-02-21 MED ORDER — METHYLPREDNISOLONE SODIUM SUCC 125 MG IJ SOLR: 60.0000 mg | Freq: Once | INTRAMUSCULAR | Status: DC

## 2017-02-21 NOTE — Progress Notes (Signed)
Discharge instructions reviewed with the patient to include new and changed medications, activity, diet and follow up visits.  Printed copies given to the patient. Patient taken to the door via wheelchair.  Home via POV.

## 2017-02-21 NOTE — Plan of Care (Signed)
Problem: Health Behavior/Discharge Planning: Goal: Ability to manage health-related needs will improve Outcome: Completed/Met Date Met: 02/21/17 Encouraged patient to follow up with PCP and to comply with medication regiment per orders.

## 2017-02-21 NOTE — Progress Notes (Signed)
Progress Note  Patient Name: Jay [name] Date of Encounter: 02/21/2017  Primary Cardiologist: Dr. Sallyanne Kuster  EP: Dr. Lovena Le   Subjective   Feeling better. No new complaints.   Inpatient Medications    Scheduled Meds: . allopurinol  300 mg Oral Daily  . aspirin EC  81 mg Oral Daily  . atorvastatin  20 mg Oral q1800  . ciprofloxacin  500 mg Oral BID  . diltiazem  180 mg Oral Daily  . enoxaparin (LOVENOX) injection  40 mg Subcutaneous Q24H  . fesoterodine  4 mg Oral Daily  . furosemide  40 mg Intravenous BID  . [START ON 02/23/2017] furosemide  40 mg Oral Daily  . loratadine  10 mg Oral Daily  . metoprolol succinate  25 mg Oral Daily  . mometasone-formoterol  2 puff Inhalation BID  . montelukast  10 mg Oral QHS  . pantoprazole  40 mg Oral Daily  . potassium chloride  20 mEq Oral BID  . sodium chloride flush  3 mL Intravenous Q12H  . tiotropium  18 mcg Inhalation Daily   Continuous Infusions: . sodium chloride     PRN Meds: sodium chloride, acetaminophen, ALPRAZolam, nitroGLYCERIN, ondansetron (ZOFRAN) IV, sodium chloride flush, zolpidem   Vital Signs    Vitals:   02/20/17 2020 02/20/17 2333 02/21/17 0544 02/21/17 0802  BP: 130/85 (!) 103/56 114/65   Pulse: (!) 104 (!) 137 92   Resp: 18  18   Temp: 98 F (36.7 C)  98.3 F (36.8 C)   TempSrc: Oral  Oral   SpO2: 98%  98% 91%  Weight: 247 lb 3.2 oz (112.1 kg)  245 lb 1.6 oz (111.2 kg)   Height: 5\' 9"  (1.753 m)       Intake/Output Summary (Last 24 hours) at 02/21/17 0908 Last data filed at 02/21/17 0234  Gross per 24 hour  Intake              397 ml  Output              150 ml  Net              247 ml   Filed Weights   02/20/17 1017 02/20/17 2020 02/21/17 0544  Weight: 245 lb (111.1 kg) 247 lb 3.2 oz (112.1 kg) 245 lb 1.6 oz (111.2 kg)    Telemetry    Atrial flutter 3:1  - Personally Reviewed  ECG    Atrial flutter - Personally Reviewed  Physical Exam   GEN: No acute distress.   Neck: No  JV,D Cardiac: irregular rhythm, regular rate, no murmurs, rubs, or gallops.  Respiratory: Clear to auscultation bilaterally. GI: Soft, nontender, non-distended  MS: No edema; No deformity. Neuro:  Nonfocal  Psych: Normal affect   Labs    Chemistry Recent Labs Lab 02/20/17 1028 02/20/17 1928 02/21/17 0242  NA 138  --  138  K 4.5  --  4.1  CL 100*  --  100*  CO2 29  --  30  GLUCOSE 123*  --  110*  BUN 13  --  16  CREATININE 1.03 1.18 1.15  CALCIUM 9.3  --  9.1  PROT  --   --  6.3*  ALBUMIN  --   --  3.4*  AST  --   --  18  ALT  --   --  41  ALKPHOS  --   --  62  BILITOT  --   --  0.6  GFRNONAA >  60 >60 >60  GFRAA >60 >60 >60  ANIONGAP 9  --  8     Hematology Recent Labs Lab 02/20/17 1028 02/20/17 1928  WBC 8.5 8.1  RBC 3.99* 3.84*  HGB 11.8* 11.9*  HCT 38.8* 37.3*  MCV 97.2 97.1  MCH 29.6 31.0  MCHC 30.4 31.9  RDW 15.1 15.3  PLT 218 219    Cardiac EnzymesNo results for input(s): TROPONINI in the last 168 hours.  Recent Labs Lab 02/20/17 1043  TROPIPOC 0.08     BNP Recent Labs Lab 02/20/17 1928  BNP 354.7*     DDimer No results for input(s): DDIMER in the last 168 hours.   Radiology    Dg Chest Portable 1 View  Result Date: 02/20/2017 CLINICAL DATA:  66 year old male with increased heart rate and shortness of breath for the past 4 days. EXAM: PORTABLE CHEST 1 VIEW COMPARISON:  Chest x-ray 02/15/2015. FINDINGS: Lung volumes are normal. No consolidative airspace disease. No pleural effusions. Mild linear scarring in the mid lungs bilaterally, similar to the prior examinations. Mild pulmonary venous congestion, without frank pulmonary edema. Mild cardiomegaly. Upper mediastinal contours are within normal limits. Aortic atherosclerosis. Status post median sternotomy for CABG. Several broken upper median sternotomy wires are again noted. IMPRESSION: 1. No radiographic evidence of acute cardiopulmonary disease. 2. Cardiomegaly. 3. Aortic atherosclerosis.  Electronically Signed   By: Vinnie Langton M.D.   On: 02/20/2017 11:54    Cardiac Studies   2D Echo 01/15/17 Study Conclusions  - Left ventricle: The cavity size was moderately dilated. Wall   thickness was increased in a pattern of mild LVH. Systolic   function was mildly to moderately reduced. The estimated ejection   fraction was in the range of 40% to 45%. Diffuse hypokinesis.   Doppler parameters are consistent with abnormal left ventricular   relaxation (grade 1 diastolic dysfunction). Doppler parameters   are consistent with high ventricular filling pressure. - Aortic valve: There was trivial regurgitation. - Left atrium: The atrium was mildly dilated. - Right ventricle: The cavity size was mildly dilated. - Right atrium: The atrium was mildly dilated.  Impressions:  - Mild to moderate global reduction in LV systolic function; mild   diastolic dysfunction with elevated LV fillin pressure; moderate   LVE; mild LVH; mild biatrial enlargement; mild RVE.  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 Also with h/o paroxsymal atrial flutter, pt declined anticoagulation in past given GIB and anemia.   Pt admitted 02/21/17 for recurrent atrial flutter and acute on chronic combined systolic and diastolic CHF.   Assessment & Plan    1. Recurrent Atrial Flutter: rate is better controlled. Continue rate control regimen. Given upcomming knee surgery, will hold off on anticoagulation at this time. He will need to start [facility] post surgery.   2. Acute on Chronic Combined Systolic and Diastolic CHF: diuresed with Lasix. Feeling better.   3. Ischemic Cardiomyopathy: EF 40-45% on echo 12/2016.   4. CAD: s/p CABG in 2007. 2/4 occluded grafts on repeat cath in 2008. Negative myoview in 2016. On medical  management.   5. COPD:    6. Chron's Colitis: followed by Dr. Benson Norway.   7. OSA: on CPAP.   For questions or updates, please contact [place] Please consult [email or web address] for contact  info under Cardiology/STEMI.     Signed, Lyda Jester, PA-C  02/21/2017, 9:08 AM    I have seen and examined the patient along with Lyda Jester, PA-C .  I have reviewed the chart, notes and new data.  I agree with PA/NP's note.  Key new complaints: has a little wheezing today, still has some flu-like symptoms Key examination changes: mild bilateral wheezing, irregular rhythm Key new findings / data:  heart rate in 80s overnight, 90-105 this morning; remains in atypical atrial flutter  PLAN: Do not want to put him on steroids due to upcoming knee surgery, but will give one dose of solumedrol today. Continue current rate control meds - these should not be interrupted during the perioperative period. Start oral anticoagulation (full dose) for atrial flutter as well as DVT/PE prophylaxis as soon as this is safe after his knee surgery. Long term use of anticoagulants may be limited by lower GI bleeding, but his colitis now appears to be in remission and hopefully will not be an issue.  [name], MD, [place] [phone] 02/21/2017, 10:09 AM

## 2017-02-21 NOTE — Plan of Care (Signed)
Problem: Safety: Goal: Ability to remain free from injury will improve Outcome: Completed/Met Date Met: 02/21/17 Encouraged patient to transition slowly (esp. Sitting to standing) to prevent falls.  To take frequent rest breaks if feels SOB.    Problem: Activity: Goal: Risk for activity intolerance will decrease Outcome: Completed/Met Date Met: 02/21/17 Encourage patient to ambulate as tolerated and to take rest breaks as needed.    Problem: Bowel/Gastric: Goal: Will not experience complications related to bowel motility Outcome: Completed/Met Date Met: 02/21/17 Encouraged patient to ambulate as tolerated to aid with bowel motility.

## 2017-02-21 NOTE — Discharge Summary (Signed)
Discharge Summary    Patient ID: Jay [name],  MRN: [id number], DOB/AGE: 1950-11-17 66 y.o.  Admit date: 02/20/2017 Discharge date: 02/21/2017  Primary Care Provider: Cathlean Sauer Primary Cardiologist: Dr. Sallyanne Kuster  EP: Dr  Lovena Le (last seen in 2016)  Discharge Diagnoses    Active Problems:   Acute on chronic combined systolic and diastolic CHF (congestive heart failure) ([place])   Cardiomyopathy, ischemic, ejection fraction 40-45%   Mild obesity   S/P CABG x 4, 2007   Sleep apnea   COPD (chronic obstructive pulmonary disease) ([place])   Anemia   Crohn's colitis ([place])   Atrial flutter (HCC)   Allergies Allergies  Allergen Reactions  . Lipitor [Atorvastatin] Other (See Comments)    Myalgia  . 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 He has a history of chronic combined CHF with a EF 40-45% Sept 2018 and grade 1 DD. He has significant  COPD, and OSA on C-pap. In 2017 he had some problems with anemia and a GI work up by Dr Benson [place] revealed Crohn's colitis. He was placed on steroids. In Sept 2017 he had severe anemia and he required transfusion.  He saw Dr. Sallyanne Kuster in the office 01/04/17 and was noted to be in atrial flutter vs MAT. Dr Sallyanne Kuster recommend starting anticoagulation and setting up TEE guided cardioversion. The pt was reluctant to start this secondary to his severe anemia requiring transfusion in 2017. Dr Sallyanne Kuster felt that in the long run he'd probably do best with radiofrequency ablation, because of his risk for recurrent bleeding secondary to Crohn's. He saw Dre Crenshaw 48 hrs later in follow up (01/06/17) and the pt was ion NSR. Low dose beta blocker was added, he hasn't tolerated  high doses in the past. An echo was done 01/15/17 and was unchanged from previous echo's- ERF 40-45%.  The pt had been doing well. This past Fridfay he received his flu shot. Saturday he did not feel well, slightly more SOB than usual and had vague SSCP-"pressure". Sunday he checked his O2 sat which was fine but his HR was 140. He declined to come tot the ED thinking it would again resolve on its own but today he again noted HR 140's and was instructed to come to the ED by the office. He has also noted a 10 lb wgt gain to 240 lbs. He denies palpitations but admits to a generalized feeling of "not getting enough air". In the ED he was in A flutter with 2:1 conduction at 147.  Hospital Course     Pt was admitted for management of atrial flutter w/ RVR and acute on chronic combined systolic and diastolic HF. He was placed on IV Cardizem for rate control. Also given IV Lasix for diuresis. His HR improved with IV Cardizem and symptoms resolved. IV Cardizem stopped and he was transitioned to oral  Cardizem 180 mg daily. Metoprolol was continued. Given plans to undergo knee surgery next week, decision was made to delay initiation or oral anticoagulation until after his surgery. He also responded well to Lasix. He was seen by Dr. Sallyanne Kuster, who determined he was stable for discharge home. Plan is to continue  rate control meds. These should not be interpreted during the perioperative period. It has been recommended that he start oral anticoagulation (full dose), either Xarelto or Eliquis for atrial flutter as well as DVT/PE prophylaxis, as soon as this is safe after his knee surgery. Long term use of anticoagulants may be limited by lower GI bleeding, but his colitis now appears to be in remission and hopefully will not be an issue. D/c note and recommendations will be routed to his orthopedic surgeon, Dr. Davina Poke. He has f/u with Dr. Sallyanne Kuster 04/04/17.    Consultants: none    Discharge Vitals Blood pressure  121/67, pulse 82, temperature 98.2 F (36.8 C), temperature source Oral, resp. rate 20, height 5\' 9"  (1.753 m), weight 245 lb 1.6 oz (111.2 kg), SpO2 93 %.  Filed Weights   02/20/17 1017 02/20/17 2020 02/21/17 0544  Weight: 245 lb (111.1 kg) 247 lb 3.2 oz (112.1 kg) 245 lb 1.6 oz (111.2 kg)    Labs & Radiologic Studies    CBC  Recent Labs  02/20/17 1028 02/20/17 1928  WBC 8.5 8.1  HGB 11.8* 11.9*  HCT 38.8* 37.3*  MCV 97.2 97.1  PLT 218 448   Basic Metabolic Panel  Recent Labs  02/20/17 1028 02/20/17 1928 02/21/17 0242  NA 138  --  138  K 4.5  --  4.1  CL 100*  --  100*  CO2 29  --  30  GLUCOSE 123*  --  110*  BUN 13  --  16  CREATININE 1.03 1.18 1.15  CALCIUM 9.3  --  9.1  MG  --  1.9  --    Liver Function Tests  Recent Labs  02/21/17 0242  AST 18  ALT 41  ALKPHOS 62  BILITOT 0.6  PROT 6.3*  ALBUMIN 3.4*   No results for input(s): LIPASE, AMYLASE in the last 72 hours. Cardiac Enzymes No results for input(s): CKTOTAL, CKMB, CKMBINDEX, TROPONINI in the last 72 hours. BNP Invalid input(s): POCBNP D-Dimer No results for input(s): DDIMER in the last 72 hours. Hemoglobin A1C  Recent Labs  02/20/17 1928  HGBA1C 6.4*   Fasting Lipid Panel No results for input(s): CHOL, HDL, LDLCALC, TRIG, CHOLHDL, LDLDIRECT in the last 72 hours. Thyroid Function Tests  Recent Labs  02/20/17 1922  TSH 0.647   _____________  Dg Chest Portable 1 View  Result Date: 02/20/2017 CLINICAL DATA:  66 year old male with increased heart rate and shortness of breath for the past 4 days. EXAM: PORTABLE CHEST 1 VIEW COMPARISON:  Chest x-ray 02/15/2015. FINDINGS: Lung volumes are normal. No consolidative airspace disease. No pleural effusions. Mild linear scarring in the mid lungs bilaterally, similar to the prior examinations. Mild pulmonary venous congestion, without frank pulmonary edema. Mild cardiomegaly. Upper mediastinal contours are within normal limits. Aortic  atherosclerosis. Status post median sternotomy for CABG. Several broken upper median sternotomy wires are again noted. IMPRESSION: 1. No radiographic evidence of acute cardiopulmonary disease. 2. Cardiomegaly. 3. Aortic atherosclerosis. Electronically Signed   By: Vinnie Langton M.D.   On: 02/20/2017 11:54   Disposition   Pt is being discharged home today in good condition.  Follow-up Plans & Appointments    Follow-up Information    Croitoru, Mihai, MD Follow up on 04/04/2017.   Specialty:  Cardiology Why:  8:40 am  Contact information: [address] [place] [address] [phone]          Discharge Instructions    Diet - low sodium heart healthy  Complete by:  As directed    Increase activity slowly    Complete by:  As directed       Discharge Medications   Discharge Medication List as of 02/21/2017 11:49 AM    START taking these medications   Details  diltiazem (CARDIZEM CD) 180 MG 24 hr capsule Take 1 capsule (180 mg total) by mouth daily., Starting Fri 02/22/2017, Normal      CONTINUE these medications which have CHANGED   Details  furosemide (LASIX) 40 MG tablet Take 1 tablet (40 mg total) by mouth daily., Starting Thu 02/21/2017, Normal    metoprolol succinate (TOPROL XL) 25 MG 24 hr tablet Take 1 tablet (25 mg total) by mouth daily., Starting Thu 02/21/2017, Normal      CONTINUE these medications which have NOT CHANGED   Details  acetaminophen (TYLENOL) 650 MG CR tablet Take 650 mg by mouth every 8 (eight) hours as needed for pain., Historical Med    albuterol (PROAIR HFA) 108 (90 BASE) MCG/ACT inhaler Inhale 1 puff into the lungs daily. And prn, Historical Med    albuterol (PROVENTIL) (2.5 MG/3ML) 0.083% nebulizer solution Take 2.5 mg by nebulization every 4 (four) hours as needed for wheezing., Historical Med    allopurinol (ZYLOPRIM) 300 MG tablet Take 300 mg by mouth daily., Historical Med    atorvastatin (LIPITOR) 20 MG tablet  Take 20 mg by mouth daily. , Historical Med    cetirizine (ZYRTEC) 10 MG tablet Take 10 mg by mouth daily., Historical Med    ciprofloxacin (CIPRO) 500 MG tablet Take 500 mg by mouth 3 (three) times daily., Historical Med    Cyanocobalamin (VITAMIN B 12) 100 MCG LOZG Take 2,500 mcg by mouth daily. , Historical Med    Fluticasone-Salmeterol (ADVAIR) 250-50 MCG/DOSE AEPB Inhale 1 puff into the lungs daily., Until Discontinued, Historical Med    ipratropium-albuterol (DUONEB) 0.5-2.5 (3) MG/3ML SOLN Inhale 3 mLs into the lungs every 6 (six) hours as needed (wheezing or SOB). , Historical Med    montelukast (SINGULAIR) 10 MG tablet Take 10 mg by mouth at bedtime., Until Discontinued, Historical Med    omeprazole (PRILOSEC) 40 MG capsule Take 40 mg by mouth daily. , Starting Wed 12/07/2015, Historical Med    potassium chloride (KLOR-CON M10) 10 MEQ tablet Take 2 tablets (20 mEq total) by mouth 2 (two) times daily., Starting 09/30/2014, Until Discontinued, Normal    tiotropium (SPIRIVA HANDIHALER) 18 MCG inhalation capsule Place 1 Inhaler into inhaler and inhale daily., Until Discontinued, Historical Med    tolterodine (DETROL LA) 4 MG 24 hr capsule Take 4 mg by mouth daily., Historical Med    Urea 50 % CREA Apply 1 application topically daily as needed (psoriasis). , Starting Thu 03/18/2014, Historical Med    ustekinumab (STELARA) 90 MG/ML SOSY injection Inject 90 mg into the skin every 8 (eight) weeks. , Historical Med    zolpidem (AMBIEN) 10 MG tablet Take 10 mg by mouth at bedtime as needed for sleep. , Starting Mon 08/03/2013, Historical Med    amoxicillin-clavulanate (AUGMENTIN) 875-125 MG tablet Take 1 tablet by mouth 2 (two) times daily., Starting Tue 06/26/2016, Normal    metolazone (ZAROXOLYN) 2.5 MG tablet take 1 tablet by mouth 30 MINUTES PRIOR TO FUROSEMIDE IF WEIGHT IS GREATER THAN 240 LBS, Normal       Outstanding Labs/Studies   None   Duration of Discharge Encounter    Greater than 30 minutes including physician time.  Signed, Lyda Jester PA-C 02/21/2017,  4:19 PM

## 2017-02-27 ENCOUNTER — Telehealth: Payer: Self-pay | Admitting: Cardiovascular Disease

## 2017-02-27 NOTE — Telephone Encounter (Signed)
Unable to contact pt x3 number

## 2017-02-27 NOTE — Telephone Encounter (Signed)
He should increase the Diltiazem to 180 mg BID- Keep f/u  Naina Sleeper PA-C 02/27/2017 1:10 PM

## 2017-02-27 NOTE — Telephone Encounter (Signed)
Off and on for a week.  STAT if HR is under 50 or over 120 (normal HR is 60-100 beats per minute)  1) What is your heart rate? 137     02     95  2) Do you have a log of your heart rate readings (document readings)? No   3) Do you have any other symptoms? DON

## 2017-02-27 NOTE — Telephone Encounter (Signed)
Spoke with patient and he has been having fast heart rate. He was just discharged from hospital 02/21/17. He was to start Xarelto or Eliquis after knee surgery on Monday. Surgery was cancelled secondary to heart rate being 144. Heart rate this morning was 134 but down to 76. Per patient his heart rate has been doing this since he left hospital. Reviewed medication list with patient and he states he has been taking as directed. Stated he was feeling some shortness of breath. Offered an appointment with Doreene Burke PA today but he requested appointment tomorrow. Scheduled appointment for tomorrow morning with [facility].

## 2017-02-27 NOTE — Telephone Encounter (Signed)
Advised patient of recommendations.  

## 2017-02-28 ENCOUNTER — Inpatient Hospital Stay (HOSPITAL_COMMUNITY)
Admission: EM | Admit: 2017-02-28 | Discharge: 2017-03-30 | DRG: 273 | Disposition: E | Payer: Medicare HMO | Attending: Family Medicine | Admitting: Family Medicine

## 2017-02-28 ENCOUNTER — Emergency Department (HOSPITAL_COMMUNITY): Payer: Medicare HMO

## 2017-02-28 ENCOUNTER — Ambulatory Visit (INDEPENDENT_AMBULATORY_CARE_PROVIDER_SITE_OTHER): Payer: Medicare HMO | Admitting: Cardiology

## 2017-02-28 ENCOUNTER — Encounter (HOSPITAL_COMMUNITY): Payer: Self-pay

## 2017-02-28 ENCOUNTER — Other Ambulatory Visit: Payer: Self-pay

## 2017-02-28 ENCOUNTER — Encounter: Payer: Self-pay | Admitting: Cardiology

## 2017-02-28 VITALS — BP 158/73 | HR 100 | Ht 69.0 in | Wt 258.0 lb

## 2017-02-28 DIAGNOSIS — Z8546 Personal history of malignant neoplasm of prostate: Secondary | ICD-10-CM

## 2017-02-28 DIAGNOSIS — I482 Chronic atrial fibrillation: Secondary | ICD-10-CM | POA: Diagnosis present

## 2017-02-28 DIAGNOSIS — I2510 Atherosclerotic heart disease of native coronary artery without angina pectoris: Secondary | ICD-10-CM | POA: Diagnosis present

## 2017-02-28 DIAGNOSIS — L4050 Arthropathic psoriasis, unspecified: Secondary | ICD-10-CM | POA: Diagnosis present

## 2017-02-28 DIAGNOSIS — Z66 Do not resuscitate: Secondary | ICD-10-CM | POA: Diagnosis present

## 2017-02-28 DIAGNOSIS — E785 Hyperlipidemia, unspecified: Secondary | ICD-10-CM | POA: Diagnosis present

## 2017-02-28 DIAGNOSIS — I252 Old myocardial infarction: Secondary | ICD-10-CM

## 2017-02-28 DIAGNOSIS — I4892 Unspecified atrial flutter: Secondary | ICD-10-CM | POA: Diagnosis not present

## 2017-02-28 DIAGNOSIS — Z79899 Other long term (current) drug therapy: Secondary | ICD-10-CM

## 2017-02-28 DIAGNOSIS — Z9981 Dependence on supplemental oxygen: Secondary | ICD-10-CM | POA: Diagnosis not present

## 2017-02-28 DIAGNOSIS — Z7951 Long term (current) use of inhaled steroids: Secondary | ICD-10-CM

## 2017-02-28 DIAGNOSIS — Z6836 Body mass index (BMI) 36.0-36.9, adult: Secondary | ICD-10-CM

## 2017-02-28 DIAGNOSIS — J438 Other emphysema: Secondary | ICD-10-CM

## 2017-02-28 DIAGNOSIS — K219 Gastro-esophageal reflux disease without esophagitis: Secondary | ICD-10-CM | POA: Diagnosis present

## 2017-02-28 DIAGNOSIS — E871 Hypo-osmolality and hyponatremia: Secondary | ICD-10-CM | POA: Diagnosis not present

## 2017-02-28 DIAGNOSIS — Z951 Presence of aortocoronary bypass graft: Secondary | ICD-10-CM | POA: Diagnosis not present

## 2017-02-28 DIAGNOSIS — E876 Hypokalemia: Secondary | ICD-10-CM | POA: Diagnosis not present

## 2017-02-28 DIAGNOSIS — I5041 Acute combined systolic (congestive) and diastolic (congestive) heart failure: Secondary | ICD-10-CM

## 2017-02-28 DIAGNOSIS — I484 Atypical atrial flutter: Secondary | ICD-10-CM | POA: Diagnosis not present

## 2017-02-28 DIAGNOSIS — K50111 Crohn's disease of large intestine with rectal bleeding: Secondary | ICD-10-CM | POA: Diagnosis not present

## 2017-02-28 DIAGNOSIS — Z9079 Acquired absence of other genital organ(s): Secondary | ICD-10-CM

## 2017-02-28 DIAGNOSIS — G4733 Obstructive sleep apnea (adult) (pediatric): Secondary | ICD-10-CM | POA: Diagnosis present

## 2017-02-28 DIAGNOSIS — K5090 Crohn's disease, unspecified, without complications: Secondary | ICD-10-CM | POA: Diagnosis present

## 2017-02-28 DIAGNOSIS — I110 Hypertensive heart disease with heart failure: Secondary | ICD-10-CM | POA: Diagnosis present

## 2017-02-28 DIAGNOSIS — I429 Cardiomyopathy, unspecified: Secondary | ICD-10-CM | POA: Diagnosis present

## 2017-02-28 DIAGNOSIS — I483 Typical atrial flutter: Principal | ICD-10-CM | POA: Diagnosis present

## 2017-02-28 DIAGNOSIS — R0602 Shortness of breath: Secondary | ICD-10-CM

## 2017-02-28 DIAGNOSIS — I959 Hypotension, unspecified: Secondary | ICD-10-CM

## 2017-02-28 DIAGNOSIS — J449 Chronic obstructive pulmonary disease, unspecified: Secondary | ICD-10-CM | POA: Diagnosis present

## 2017-02-28 DIAGNOSIS — D649 Anemia, unspecified: Secondary | ICD-10-CM | POA: Diagnosis present

## 2017-02-28 DIAGNOSIS — I5043 Acute on chronic combined systolic (congestive) and diastolic (congestive) heart failure: Secondary | ICD-10-CM | POA: Diagnosis present

## 2017-02-28 DIAGNOSIS — Z888 Allergy status to other drugs, medicaments and biological substances status: Secondary | ICD-10-CM

## 2017-02-28 DIAGNOSIS — Z87891 Personal history of nicotine dependence: Secondary | ICD-10-CM

## 2017-02-28 DIAGNOSIS — Z8249 Family history of ischemic heart disease and other diseases of the circulatory system: Secondary | ICD-10-CM

## 2017-02-28 DIAGNOSIS — G4730 Sleep apnea, unspecified: Secondary | ICD-10-CM | POA: Diagnosis present

## 2017-02-28 DIAGNOSIS — M79671 Pain in right foot: Secondary | ICD-10-CM | POA: Diagnosis not present

## 2017-02-28 DIAGNOSIS — M79672 Pain in left foot: Secondary | ICD-10-CM | POA: Diagnosis not present

## 2017-02-28 DIAGNOSIS — I509 Heart failure, unspecified: Secondary | ICD-10-CM

## 2017-02-28 DIAGNOSIS — I5023 Acute on chronic systolic (congestive) heart failure: Secondary | ICD-10-CM | POA: Diagnosis not present

## 2017-02-28 DIAGNOSIS — J9601 Acute respiratory failure with hypoxia: Secondary | ICD-10-CM | POA: Diagnosis present

## 2017-02-28 DIAGNOSIS — I248 Other forms of acute ischemic heart disease: Secondary | ICD-10-CM | POA: Diagnosis present

## 2017-02-28 DIAGNOSIS — I255 Ischemic cardiomyopathy: Secondary | ICD-10-CM | POA: Diagnosis present

## 2017-02-28 DIAGNOSIS — I340 Nonrheumatic mitral (valve) insufficiency: Secondary | ICD-10-CM | POA: Diagnosis not present

## 2017-02-28 HISTORY — DX: Unspecified atrial flutter: I48.92

## 2017-02-28 HISTORY — DX: Dependence on supplemental oxygen: Z99.81

## 2017-02-28 LAB — CBC
HEMATOCRIT: 35.6 % — AB (ref 39.0–52.0)
Hemoglobin: 11.2 g/dL — ABNORMAL LOW (ref 13.0–17.0)
MCH: 30 pg (ref 26.0–34.0)
MCHC: 31.5 g/dL (ref 30.0–36.0)
MCV: 95.4 fL (ref 78.0–100.0)
Platelets: 351 10*3/uL (ref 150–400)
RBC: 3.73 MIL/uL — ABNORMAL LOW (ref 4.22–5.81)
RDW: 14.9 % (ref 11.5–15.5)
WBC: 10 10*3/uL (ref 4.0–10.5)

## 2017-02-28 LAB — CBC WITH DIFFERENTIAL/PLATELET
BASOS ABS: 0 10*3/uL (ref 0.0–0.1)
Basophils Relative: 0 %
Eosinophils Absolute: 0.2 10*3/uL (ref 0.0–0.7)
Eosinophils Relative: 2 %
HCT: 32.8 % — ABNORMAL LOW (ref 39.0–52.0)
Hemoglobin: 10.4 g/dL — ABNORMAL LOW (ref 13.0–17.0)
Lymphocytes Relative: 15 %
Lymphs Abs: 1.4 10*3/uL (ref 0.7–4.0)
MCH: 30.2 pg (ref 26.0–34.0)
MCHC: 31.7 g/dL (ref 30.0–36.0)
MCV: 95.3 fL (ref 78.0–100.0)
Monocytes Absolute: 0.6 10*3/uL (ref 0.1–1.0)
Monocytes Relative: 6 %
NEUTROS ABS: 7.4 10*3/uL (ref 1.7–7.7)
NEUTROS PCT: 77 %
PLATELETS: 339 10*3/uL (ref 150–400)
RBC: 3.44 MIL/uL — ABNORMAL LOW (ref 4.22–5.81)
RDW: 15 % (ref 11.5–15.5)
WBC: 9.6 10*3/uL (ref 4.0–10.5)

## 2017-02-28 LAB — BRAIN NATRIURETIC PEPTIDE: B Natriuretic Peptide: 367.1 pg/mL — ABNORMAL HIGH (ref 0.0–100.0)

## 2017-02-28 LAB — CREATININE, SERUM
Creatinine, Ser: 1.56 mg/dL — ABNORMAL HIGH (ref 0.61–1.24)
GFR calc Af Amer: 52 mL/min — ABNORMAL LOW (ref >60)
GFR calc non Af Amer: 45 mL/min — ABNORMAL LOW (ref >60)

## 2017-02-28 LAB — BASIC METABOLIC PANEL
ANION GAP: 10 (ref 5–15)
BUN: 13 mg/dL (ref 6–20)
CALCIUM: 8.7 mg/dL — AB (ref 8.9–10.3)
CO2: 30 mmol/L (ref 22–32)
Chloride: 99 mmol/L — ABNORMAL LOW (ref 101–111)
Creatinine, Ser: 1.04 mg/dL (ref 0.61–1.24)
GFR calc Af Amer: >60 mL/min (ref >60)
GLUCOSE: 122 mg/dL — AB (ref 65–99)
Potassium: 3.9 mmol/L (ref 3.5–5.1)
Sodium: 139 mmol/L (ref 135–145)

## 2017-02-28 LAB — I-STAT TROPONIN, ED: Troponin i, poc: 0.02 ng/mL (ref 0.00–0.08)

## 2017-02-28 LAB — TROPONIN I: Troponin I: 0.05 ng/mL (ref <0.03)

## 2017-02-28 MED ORDER — ATORVASTATIN CALCIUM 20 MG PO TABS
20.0000 mg | ORAL_TABLET | Freq: Every day | ORAL | Status: DC
  Administered 2017-03-01 – 2017-03-07 (×7): 20 mg via ORAL
  Filled 2017-02-28 (×6): qty 1
  Filled 2017-02-28: qty 2
  Filled 2017-02-28: qty 1

## 2017-02-28 MED ORDER — METOPROLOL SUCCINATE ER 25 MG PO TB24
25.0000 mg | ORAL_TABLET | Freq: Every day | ORAL | Status: DC
  Administered 2017-03-01: 25 mg via ORAL
  Filled 2017-02-28 (×2): qty 1

## 2017-02-28 MED ORDER — HEPARIN SODIUM (PORCINE) 5000 UNIT/ML IJ SOLN
5000.0000 [IU] | Freq: Three times a day (TID) | INTRAMUSCULAR | Status: DC
  Administered 2017-03-01 – 2017-03-05 (×13): 5000 [IU] via SUBCUTANEOUS
  Filled 2017-02-28 (×14): qty 1

## 2017-02-28 MED ORDER — DILTIAZEM HCL ER 180 MG PO CP24
360.0000 mg | ORAL_CAPSULE | Freq: Every day | ORAL | Status: DC
  Filled 2017-02-28: qty 2

## 2017-02-28 MED ORDER — LORATADINE 10 MG PO TABS
10.0000 mg | ORAL_TABLET | Freq: Every day | ORAL | Status: DC
  Administered 2017-03-01 – 2017-03-07 (×7): 10 mg via ORAL
  Filled 2017-02-28 (×7): qty 1

## 2017-02-28 MED ORDER — FUROSEMIDE 10 MG/ML IJ SOLN
80.0000 mg | Freq: Once | INTRAMUSCULAR | Status: AC
  Administered 2017-02-28: 80 mg via INTRAVENOUS
  Filled 2017-02-28: qty 8

## 2017-02-28 MED ORDER — TIOTROPIUM BROMIDE MONOHYDRATE 18 MCG IN CAPS
18.0000 ug | ORAL_CAPSULE | Freq: Every day | RESPIRATORY_TRACT | Status: DC
  Administered 2017-03-01 – 2017-03-06 (×6): 18 ug via RESPIRATORY_TRACT
  Filled 2017-02-28 (×3): qty 5

## 2017-02-28 MED ORDER — FESOTERODINE FUMARATE ER 4 MG PO TB24
4.0000 mg | ORAL_TABLET | Freq: Every day | ORAL | Status: DC
  Administered 2017-03-01 – 2017-03-07 (×7): 4 mg via ORAL
  Filled 2017-02-28 (×9): qty 1

## 2017-02-28 MED ORDER — PANTOPRAZOLE SODIUM 40 MG PO TBEC
40.0000 mg | DELAYED_RELEASE_TABLET | Freq: Every day | ORAL | Status: DC
  Administered 2017-03-01 – 2017-03-07 (×7): 40 mg via ORAL
  Filled 2017-02-28 (×7): qty 1

## 2017-02-28 MED ORDER — MOMETASONE FURO-FORMOTEROL FUM 200-5 MCG/ACT IN AERO: 2.0000 | INHALATION_SPRAY | Freq: Two times a day (BID) | RESPIRATORY_TRACT | Status: DC

## 2017-02-28 MED ORDER — ALLOPURINOL 300 MG PO TABS
300.0000 mg | ORAL_TABLET | Freq: Every day | ORAL | Status: DC
Start: 2017-03-01 — End: 2017-03-08
  Administered 2017-03-01 – 2017-03-07 (×7): 300 mg via ORAL
  Filled 2017-02-28 (×2): qty 1
  Filled 2017-02-28: qty 3
  Filled 2017-02-28 (×5): qty 1

## 2017-02-28 NOTE — ED Triage Notes (Signed)
Pt arrives via EMS from cardiology office when he was being seen for follow up appointment. Pt was Admitted at [facility] for 1 night ~ 5 days ago d/t afib and sob. While he was here pt lasix dose was decreased from 80mg  to 40mg . 2 days ago pt increased lasix dose back to 80mg  but has continued to retain fluid. Pt states he has o2 at home for prn use but has been having to use continuously since Saturday. On arrival to cardiologist pt o2 concentrator battery had just died and vs obtained with spo2 at 74%. Pt placed on 4lpm and spo2 increased to 96%. Pt arrives to ED with sob, distended abd. And bilateral edema.

## 2017-02-28 NOTE — Addendum Note (Signed)
Addended by: Lyman Bishop C on: 03/23/2017 01:00 PM   Modules accepted: Level of Service

## 2017-02-28 NOTE — ED Notes (Signed)
Dr. Floyd at bedside. 

## 2017-02-28 NOTE — ED Provider Notes (Signed)
[facility] EMERGENCY DEPARTMENT Provider Note   CSN: [id number] Arrival date & time: 03/06/2017  7106     History   Chief Complaint Chief Complaint  Patient presents with  . Shortness of Breath    HPI Jay [name] is a 66 y.o. male.  Patient with history of systolic heart failure (EF 45%), CABG, Crohn's disease, atrial flutter not on anticoagulation due to Crohn's, COPD --presents with worsening shortness of breath, oxygen requirement, weight gain over the past several weeks.  Patient had recent admission for tachycardia that was controlled on medications.  He states that his heart rate has been fluctuating greatly at home.  Patient cannot lie flat without becoming very short of breath.  Patient was seen by his cardiologist PA today and was sent to the emergency department for IV diuresis.  Patient typically uses oxygen at home as needed but has needed it constantly over the past several days.  Recent surgery for knee replacement canceled due to elevated heart rate.  States that Lasix was decreased from 80 to 40 mg after recent hospitalization. The onset of this condition was acute. The course is gradually worsening. Aggravating factors: none. Alleviating factors: none. Weight 245lbs --> 258lbs over the past week.        Past Medical History:  Diagnosis Date  . Arthritis   . Borderline diabetes   . Cancer [facility]) 2008   Prostate cancer- s/p Rob. prostate surgery '08  . COPD (chronic obstructive pulmonary disease) ([place])   . Coronary artery disease   . Crohn's disease ([place])   . Dysrhythmia    ? near syncope episodes, arhythmia -not proven cause- symptoms had stop when monitored with continuous rhythm monitor.  [name] GERD (gastroesophageal reflux disease)   . History of home oxygen therapy    2 liters prn and at hs  . Hyperlipidemia   . Hypertension   . Ischemic cardiomyopathy    Ejection fraction 40-45%  . Myocardial infarction [facility]) 2008   x1  . Obesity,  Class II, BMI 35-39.9   . Obstructive sleep apnea on CPAP    uses cpap setting of 10  . Psoriasis   . S/P CABG x 4 2007   Dr.  [name]   . Tobacco abuse   . Transfusion history    x 1  -s/p CABPG    Patient Active Problem List   Diagnosis Date Noted  . Atrial flutter ([place]) 02/20/2017  . Anemia 03/13/2016  . Crohn's colitis ([place]) 03/13/2016  . NSVT (nonsustained ventricular tachycardia) ([place]) 10/13/2014  . COPD (chronic obstructive pulmonary disease) ([place]) 06/21/2014  . Acute on chronic combined systolic and diastolic CHF (congestive heart failure) ([place]) 02/24/2013  . Cardiomyopathy, ischemic, ejection fraction 40-45% 02/24/2013  . Mild obesity 02/24/2013  . Hyperlipidemia 02/24/2013  . S/P CABG x 4, 2007 02/24/2013  . Sleep apnea 02/24/2013  . History of tobacco use 02/24/2013    Past Surgical History:  Procedure Laterality Date  . CARDIAC CATHETERIZATION  07/04/2006   significant 3 vessel disease: totally vein graft to the ramus intermedius,toalled vein graft to the obtuse marginal, mildly diseased vein graft of the posterior descending artery, mildly depressed LV systolic fx.  . COLONOSCOPY WITH PROPOFOL N/A 10/28/2015   Procedure: COLONOSCOPY WITH PROPOFOL;  Surgeon: Carol Ada, MD;  Location: [facility] ENDOSCOPY;  Service: Endoscopy;  Laterality: N/A;  . COLONOSCOPY WITH PROPOFOL N/A 02/17/2016   Procedure: COLONOSCOPY WITH PROPOFOL;  Surgeon: Carol Ada, MD;  Location: [facility] ENDOSCOPY;  Service: Endoscopy;  Laterality: N/A;  . CORONARY ARTERY BYPASS GRAFT  02/14/2006   LIMA to LAD,SVG to posterior descending,SVG to CX,SVG to ramus intermediate.  . CYSTOSCOPY    . ESOPHAGOGASTRODUODENOSCOPY (EGD) WITH PROPOFOL N/A 10/28/2015   Procedure: ESOPHAGOGASTRODUODENOSCOPY (EGD) WITH PROPOFOL;  Surgeon: Carol Ada, MD;  Location: [facility] ENDOSCOPY;  Service: Endoscopy;  Laterality: N/A;  . XI ROBOTIC ASSISTED SIMPLE PROSTATECTOMY         Home Medications    Prior to Admission  medications   Medication Sig Start Date End Date Taking? Authorizing Provider  acetaminophen (TYLENOL) 650 MG CR tablet Take 650 mg by mouth every 8 (eight) hours as needed for pain.    [provider]  albuterol (PROAIR HFA) 108 (90 BASE) MCG/ACT inhaler Inhale 1 puff into the lungs daily. And prn    [provider]  albuterol (PROVENTIL) (2.5 MG/3ML) 0.083% nebulizer solution Take 2.5 mg by nebulization every 4 (four) hours as needed for wheezing.    [provider]  allopurinol (ZYLOPRIM) 300 MG tablet Take 300 mg by mouth daily.    [provider]  amoxicillin-clavulanate (AUGMENTIN) 875-125 MG tablet Take 1 tablet by mouth 2 (two) times daily. 06/26/16   Croitoru, Mihai, MD  atorvastatin (LIPITOR) 20 MG tablet Take 20 mg by mouth daily.     [provider]  cetirizine (ZYRTEC) 10 MG tablet Take 10 mg by mouth daily.    [provider]  ciprofloxacin (CIPRO) 500 MG tablet Take 500 mg by mouth 3 (three) times daily.    [provider]  Cyanocobalamin (VITAMIN B 12) 100 MCG LOZG Take 2,500 mcg by mouth daily.     [provider]  diltiazem (CARDIZEM CD) 360 MG 24 hr capsule Take 360 mg by mouth daily.    [provider]  Fluticasone-Salmeterol (ADVAIR) 250-50 MCG/DOSE AEPB Inhale 1 puff into the lungs daily.    [provider]  furosemide (LASIX) 40 MG tablet Take 1 tablet (40 mg total) by mouth daily. 02/21/17   Lyda Jester M, PA-C  ipratropium-albuterol (DUONEB) 0.5-2.5 (3) MG/3ML SOLN Inhale 3 mLs into the lungs every 6 (six) hours as needed (wheezing or SOB).     [provider]  metolazone (ZAROXOLYN) 2.5 MG tablet take 1 tablet by mouth 30 MINUTES PRIOR TO FUROSEMIDE IF WEIGHT IS GREATER THAN 240 LBS 12/12/15   Croitoru, Mihai, MD  metoprolol succinate (TOPROL XL) 25 MG 24 hr tablet Take 1 tablet (25 mg total) by mouth daily. 02/21/17   Lyda Jester M, PA-C  montelukast (SINGULAIR)  10 MG tablet Take 10 mg by mouth at bedtime.    [provider]  omeprazole (PRILOSEC) 40 MG capsule Take 40 mg by mouth daily.  12/07/15   [provider]  potassium chloride (KLOR-CON M10) 10 MEQ tablet Take 2 tablets (20 mEq total) by mouth 2 (two) times daily. 09/30/14   Hilty, Nadean Corwin, MD  tiotropium (SPIRIVA HANDIHALER) 18 MCG inhalation capsule Place 1 Inhaler into inhaler and inhale daily.    [provider]  tolterodine (DETROL LA) 4 MG 24 hr capsule Take 4 mg by mouth daily.    [provider]  Urea 50 % CREA Apply 1 application topically daily as needed (psoriasis).  03/18/14   [provider]  ustekinumab (STELARA) 90 MG/ML SOSY injection Inject 90 mg into the skin every 8 (eight) weeks.     [provider]  zolpidem (AMBIEN) 10 MG tablet Take 10 mg  by mouth at bedtime as needed for sleep.  08/03/13   [provider]    Family History Family History  Problem Relation Age of Onset  . Heart attack Father     Social History Social History  Substance Use Topics  . Smoking status: Former Smoker    Packs/day: 2.00    Years: 30.00    Types: Cigarettes    Quit date: 04/30/2000  . Smokeless tobacco: Never Used  . Alcohol use 0.0 oz/week     Comment: occ. to rare     Allergies   Lipitor [atorvastatin] and Angiotensin receptor blockers   Review of Systems Review of Systems  Constitutional: Positive for unexpected weight change. Negative for fever.  HENT: Negative for rhinorrhea and sore throat.   Eyes: Negative for redness.  Respiratory: Positive for shortness of breath. Negative for cough.   Cardiovascular: Positive for leg swelling. Negative for chest pain.  Gastrointestinal: Negative for abdominal pain, diarrhea, nausea and vomiting.  Genitourinary: Negative for dysuria.  Musculoskeletal: Negative for myalgias.  Skin: Negative for rash.  Neurological: Negative for headaches.  Psychiatric/Behavioral: Positive  for sleep disturbance.     Physical Exam Updated Vital Signs BP 135/85   Pulse (!) 138   Temp 98.7 F (37.1 C) (Oral)   Resp (!) 24   Ht 5\' 9"  (1.753 m)   Wt 117 kg (258 lb)   SpO2 96%   BMI 38.10 kg/m   Physical Exam  Constitutional: He appears well-developed and well-nourished.  HENT:  Head: Normocephalic and atraumatic.  Mouth/Throat: Oropharynx is clear and moist.  Eyes: Conjunctivae are normal. Right eye exhibits no discharge. Left eye exhibits no discharge.  Neck: Normal range of motion. Neck supple.  Cardiovascular: Normal rate, regular rhythm and normal heart sounds.   No murmur heard. HR in 80's  Pulmonary/Chest: Effort normal. Tachypnea noted. He has decreased breath sounds. He has no wheezes. He has no rales.  Abdominal: Soft. There is no tenderness.  Neurological: He is alert.  Skin: Skin is warm and dry.  Psychiatric: He has a normal mood and affect.  Nursing note and vitals reviewed.    ED Treatments / Results  Labs (all labs ordered are listed, but only abnormal results are displayed) Labs Reviewed  CBC WITH DIFFERENTIAL/PLATELET - Abnormal; Notable for the following:       Result Value   RBC 3.44 (*)    Hemoglobin 10.4 (*)    HCT 32.8 (*)    All other components within normal limits  BASIC METABOLIC PANEL - Abnormal; Notable for the following:    Chloride 99 (*)    Glucose, Bld 122 (*)    Calcium 8.7 (*)    All other components within normal limits  BRAIN NATRIURETIC PEPTIDE - Abnormal; Notable for the following:    B Natriuretic Peptide 367.1 (*)    All other components within normal limits  I-STAT TROPONIN, ED   ED ECG REPORT   Date: 03/29/2017  Rate: 81  Rhythm: atrial flutter, PVCs  QRS Axis: normal  Intervals: normal  ST/T Wave abnormalities: nonspecific T wave changes  Conduction Disutrbances:none  Narrative Interpretation:   Old EKG Reviewed: changes noted, slower  I have personally reviewed the EKG tracing and agree with the  computerized printout as noted.   Radiology No results found.  Procedures Procedures (including critical care time)  Medications Ordered in ED Medications  furosemide (LASIX) injection 80 mg (80 mg Intravenous Given 03/05/2017 1040)     Initial  Impression / Assessment and Plan / ED Course  I have reviewed the triage vital signs and the nursing notes.  Pertinent labs & imaging results that were available during my care of the patient were reviewed by me and considered in my medical decision making (see chart for details).     Patient seen and examined. Work-up initiated. Medications ordered.  Discussed with Dr. Dayna Barker.  Vital signs reviewed and are as follows: BP 135/85   Pulse (!) 138   Temp 98.7 F (37.1 C) (Oral)   Resp (!) 24   Ht 5\' 9"  (1.753 m)   Wt 117 kg (258 lb)   SpO2 96%   BMI 38.10 kg/m   Spoke with cardiology who will see patient.  1:03 PM Awaiting cards input.  Final Clinical Impressions(s) / ED Diagnoses   Final diagnoses:  Acute respiratory failure with hypoxia (HCC)  Acute combined systolic and diastolic congestive heart failure (HCC)   Admit -- CHF.   New Prescriptions New Prescriptions   No medications on file     [place] Cater, Hershal Coria 03/29/2017 1303    Mesner, Corene Cornea, MD 03/24/2017 [phone]

## 2017-02-28 NOTE — ED Notes (Signed)
More tea ordered from kitchen

## 2017-02-28 NOTE — ED Notes (Signed)
Stretcher changed for hospital bed

## 2017-02-28 NOTE — H&P (Signed)
[facility] Admission History and Physical Service Pager: [phone]  Patient name: Jay [name] Medical record number: [id number] Date of birth: 1950/05/12 Age: 66 y.o. Gender: male  Primary Care Provider: Cathlean Sauer, MD Consultants: Cardiology Code Status: Full  Chief Complaint: shortness of breath  Assessment and Plan: Jay [name] is a 66 y.o. male presenting with acute hypoxic respiratory failure. PMH is significant for psoriatic arthritis, COPD, Crohn's disease, atrial fibrillation (no anticoagulation), status post CABG x4, OSA on CPAP, chronic combined congestive heart failure   Combined congestive heart failure exacerbation with acute hypoxic respiratory failure EF of 40-45 September 2018 with grade 1 diastolic dysfunction. Seen today at cardiology office and was markedly dyspneic with saturations into the 70s and was noted to be up 18 pounds from his dry weight.  Sent to ED for diuresis and evaluation.  Appears comfortable, denies chest pain, but does endorse shortness of breath however is rate controlled at this time.  Does have crackles in the bilateral bases and does have 1+ pitting edema to the knees bilaterally.  Received his home diltiazem extended release today as well as his other home medication.  During last admission patient had good response to diltiazem IV 5 mg/h, however patient currently has heart rates in the 80s-90s at this time.  Exacerbation is most likely secondary to the persistent tachycardia for the past month.  Was given Lasix 80 mg IV and had 2.2 L output since presented to the emergency department.  -Admit to telemetry under attending McDiarmid -Continuous pulse ox -Cardiology consulted; plan to see in the a.m. -Continue Toprol XL 25 mg daily  -Lasix 80 mg in a.m. -Metolazone 2.5 mg 30 minutes prior to Lasix -Trend troponins -Strict I's and O's -Daily weights  Atrial fibrillation, rate controlled, no  anti-coagulation Per chart review, patient has been having recurrent A. fib with RVR and is not receiving any anticoagulation due to his Crohn's disease.  EKG showing a flutter in the emergency department upon arrival.  Currently rate controlled and denies any chest pain. -Continue diltiazem 180mg  BID XR; will transition to IV if rates increase -Trending troponins as above -Continue Toprol XL 25 mg daily -Continue diltiazem 180 mg BID XR -Cardiology consulted  COPD with oxygen requirement Uses 2L by [place] at night and as needed at home. No reports of fevers, chills or cough. Takes his medications daily. Former smoker. Crackles in bilateral bases, but no wheezing or rhonchi. Increased oxygen requirement to 4L. Not currently in exacerbation.  -Continuous pulse ox -Dulera 2 puffs BID -Spiriva daily -Titrate oxygen to >90% O2  Crohn's disease Stable  CAD with history of CABG x4 No chest pain. EKG showing atrial flutter but no signs of ischemia.   OSA -CPAP at night   FEN/GI: low sodium diet/protonix Prophylaxis: subcutaneous hep  Disposition: admit to telemetry under attending [place]. Cardiology to see in AM.   History of Present Illness:  Jay [name] is a 66 y.o. male with the above past medical history who presented to the emergency department after being seen at his cardiology office today for persistent tachycardia, dyspnea and worsening lower extremity edema.  In the office he was noted to be hypoxic into the 70s and sent to the emergency department for diuresis and further evaluation.  Patient reports over the past month or so that he is been having persistent tachycardia and swelling of his lower extremities, shortness of breath worse with lying down and with exertion.  He denies  any associated chest pain, numbness or discomfort in his extremities.  He was given 80 mg of Lasix IV in the emergency department and had urine output of 2.2 L.  He reports minimal improvement.  Chest  x-ray showing cardiomegaly and EKG showing atrial flutter.  Cardiology was contacted by emergency physician and reported that they would check on patient on 03/01/2017 a.m. patient was admitted to telemetry by the family medicine teaching service for further management.   Review Of Systems: Per HPI   ROS  Patient Active Problem List   Diagnosis Date Noted  . Atrial flutter ([place]) 02/20/2017  . Anemia 03/13/2016  . Crohn's colitis ([place]) 03/13/2016  . NSVT (nonsustained ventricular tachycardia) ([place]) 10/13/2014  . COPD (chronic obstructive pulmonary disease) ([place]) 06/21/2014  . Acute on chronic combined systolic and diastolic CHF (congestive heart failure) ([place]) 02/24/2013  . Cardiomyopathy, ischemic, ejection fraction 40-45% 02/24/2013  . Mild obesity 02/24/2013  . Hyperlipidemia 02/24/2013  . S/P CABG x 4, 2007 02/24/2013  . Sleep apnea 02/24/2013  . History of tobacco use 02/24/2013    Past Medical History: Past Medical History:  Diagnosis Date  . Arthritis   . Borderline diabetes   . Cancer [facility]) 2008   Prostate cancer- s/p Rob. prostate surgery '08  . COPD (chronic obstructive pulmonary disease) ([place])   . Coronary artery disease   . Crohn's disease ([place])   . Dysrhythmia    ? near syncope episodes, arhythmia -not proven cause- symptoms had stop when monitored with continuous rhythm monitor.  [name] GERD (gastroesophageal reflux disease)   . History of home oxygen therapy    2 liters prn and at hs  . Hyperlipidemia   . Hypertension   . Ischemic cardiomyopathy    Ejection fraction 40-45%  . Myocardial infarction [facility]) 2008   x1  . Obesity, Class II, BMI 35-39.9   . Obstructive sleep apnea on CPAP    uses cpap setting of 10  . Psoriasis   . S/P CABG x 4 2007   Dr.  [name]   . Tobacco abuse   . Transfusion history    x 1  -s/p CABPG    Past Surgical History: Past Surgical History:  Procedure Laterality Date  . CARDIAC CATHETERIZATION  07/04/2006   significant 3  vessel disease: totally vein graft to the ramus intermedius,toalled vein graft to the obtuse marginal, mildly diseased vein graft of the posterior descending artery, mildly depressed LV systolic fx.  . COLONOSCOPY WITH PROPOFOL N/A 10/28/2015   Procedure: COLONOSCOPY WITH PROPOFOL;  Surgeon: Carol Ada, MD;  Location: [facility] ENDOSCOPY;  Service: Endoscopy;  Laterality: N/A;  . COLONOSCOPY WITH PROPOFOL N/A 02/17/2016   Procedure: COLONOSCOPY WITH PROPOFOL;  Surgeon: Carol Ada, MD;  Location: [facility] ENDOSCOPY;  Service: Endoscopy;  Laterality: N/A;  . CORONARY ARTERY BYPASS GRAFT  02/14/2006   LIMA to LAD,SVG to posterior descending,SVG to CX,SVG to ramus intermediate.  . CYSTOSCOPY    . ESOPHAGOGASTRODUODENOSCOPY (EGD) WITH PROPOFOL N/A 10/28/2015   Procedure: ESOPHAGOGASTRODUODENOSCOPY (EGD) WITH PROPOFOL;  Surgeon: Carol Ada, MD;  Location: [facility] ENDOSCOPY;  Service: Endoscopy;  Laterality: N/A;  . XI ROBOTIC ASSISTED SIMPLE PROSTATECTOMY      Social History: Social History  Substance Use Topics  . Smoking status: Former Smoker    Packs/day: 2.00    Years: 30.00    Types: Cigarettes    Quit date: 04/30/2000  . Smokeless tobacco: Never Used  . Alcohol use 0.0 oz/week     Comment: occ.  to rare    Family History: Family History  Problem Relation Age of Onset  . Heart attack Father     Allergies and Medications: Allergies  Allergen Reactions  . Lipitor [Atorvastatin] Other (See Comments)    Myalgia  . Angiotensin Receptor Blockers Other (See Comments)    Hyperkalemia   No current facility-administered medications on file prior to encounter.    Current Outpatient Prescriptions on File Prior to Encounter  Medication Sig Dispense Refill  . acetaminophen (TYLENOL) 650 MG CR tablet Take 650 mg by mouth every 8 (eight) hours as needed for pain.    [name] albuterol (PROAIR HFA) 108 (90 BASE) MCG/ACT inhaler Inhale 1 puff into the lungs daily. And prn    . albuterol (PROVENTIL) (2.5 MG/3ML)  0.083% nebulizer solution Take 2.5 mg by nebulization every 4 (four) hours as needed for wheezing.    [name] allopurinol (ZYLOPRIM) 300 MG tablet Take 300 mg by mouth daily.    [name] atorvastatin (LIPITOR) 20 MG tablet Take 20 mg by mouth daily.     . cetirizine (ZYRTEC) 10 MG tablet Take 10 mg by mouth daily.    . Cyanocobalamin (VITAMIN B 12) 100 MCG LOZG Take 2,500 mcg by mouth daily.     . Fluticasone-Salmeterol (ADVAIR) 250-50 MCG/DOSE AEPB Inhale 1 puff into the lungs daily.    . furosemide (LASIX) 40 MG tablet Take 1 tablet (40 mg total) by mouth daily. (Patient taking differently: Take 80 mg by mouth daily. ) 30 tablet 5  . ipratropium-albuterol (DUONEB) 0.5-2.5 (3) MG/3ML SOLN Inhale 3 mLs into the lungs every 6 (six) hours as needed (wheezing or SOB).     [name] metolazone (ZAROXOLYN) 2.5 MG tablet take 1 tablet by mouth 30 MINUTES PRIOR TO FUROSEMIDE IF WEIGHT IS GREATER THAN 240 LBS 30 tablet 5  . metoprolol succinate (TOPROL XL) 25 MG 24 hr tablet Take 1 tablet (25 mg total) by mouth daily. 30 tablet 5  . montelukast (SINGULAIR) 10 MG tablet Take 10 mg by mouth at bedtime.    [name] omeprazole (PRILOSEC) 40 MG capsule Take 40 mg by mouth daily.     . potassium chloride (KLOR-CON M10) 10 MEQ tablet Take 2 tablets (20 mEq total) by mouth 2 (two) times daily. 360 tablet 3  . tiotropium (SPIRIVA HANDIHALER) 18 MCG inhalation capsule Place 1 Inhaler into inhaler and inhale daily.    [name] tolterodine (DETROL LA) 4 MG 24 hr capsule Take 4 mg by mouth daily.    . Urea 50 % CREA Apply 1 application topically daily as needed (psoriasis).   0  . zolpidem (AMBIEN) 10 MG tablet Take 10 mg by mouth at bedtime as needed for sleep.     [name] amoxicillin-clavulanate (AUGMENTIN) 875-125 MG tablet Take 1 tablet by mouth 2 (two) times daily. (Patient not taking: Reported on 03/07/2017) 20 tablet 0  . ustekinumab (STELARA) 90 MG/ML SOSY injection Inject 90 mg into the skin See admin instructions. Every 12 weeks       Objective: BP 124/71   Pulse 81   Temp 98.7 F (37.1 C) (Oral)   Resp 17   Ht 5\' 9"  (1.753 m)   Wt 258 lb (117 kg)   SpO2 95%   BMI 38.10 kg/m  Exam: General: 66 year old man sitting up in bed appearing uncomfortable but in no acute distress. Eyes: Extraocular muscles intact, no scleral icterus ENTM: No nasal drainage, moist mucous membranes and clear oropharynx Neck: JVD up to the mid neck Cardiovascular:  Tachycardic and irregularly irregular, no apparent murmurs Respiratory: Normal work of breathing, minimal crackles in the bilateral bases with diminished breath sounds, no rhonchi Gastrointestinal: Positive bowel sounds, soft nontender nondistended no peritoneal signs MSK: No gross deformities or edema Derm: Warm and dry, no rash Extremities: 1+ pitting edema to the knees bilaterally Neuro: Alert and oriented x3, no focal neurological deficits Psych: Pleasant, normal mood and affect  Labs and Imaging: CBC BMET   Recent Labs Lab 03/20/2017 1028  WBC 9.6  HGB 10.4*  HCT 32.8*  PLT 339    Recent Labs Lab 03/21/2017 1028  NA 139  K 3.9  CL 99*  CO2 30  BUN 13  CREATININE 1.04  GLUCOSE 122*  CALCIUM 8.7*     Dg Chest 2 View  Result Date: 03/05/2017 CLINICAL DATA:  Shortness of breath, CHF, worse over the last month EXAM: CHEST  2 VIEW COMPARISON:  Chest x-ray of 02/20/2017 FINDINGS: There is cardiomegaly present with some fullness of the perihilar vasculature possibly indicating mild pulmonary vascular congestion, without definite congestive heart failure. Tiny pleural effusions cannot be excluded with some blunting of the posterior costophrenic angles. No pneumonia is seen. Median sternotomy sutures are present from prior CABG. No acute bony abnormality is seen. IMPRESSION: Cardiomegaly.  Probable mild pulmonary vascular congestion. Electronically Signed   By: Ivar Drape M.D.   On: 03/24/2017 11:07   Dg Chest Portable 1 View  Result Date: 02/20/2017 CLINICAL  DATA:  66 year old male with increased heart rate and shortness of breath for the past 4 days. EXAM: PORTABLE CHEST 1 VIEW COMPARISON:  Chest x-ray 02/15/2015. FINDINGS: Lung volumes are normal. No consolidative airspace disease. No pleural effusions. Mild linear scarring in the mid lungs bilaterally, similar to the prior examinations. Mild pulmonary venous congestion, without frank pulmonary edema. Mild cardiomegaly. Upper mediastinal contours are within normal limits. Aortic atherosclerosis. Status post median sternotomy for CABG. Several broken upper median sternotomy wires are again noted. IMPRESSION: 1. No radiographic evidence of acute cardiopulmonary disease. 2. Cardiomegaly. 3. Aortic atherosclerosis. Electronically Signed   By: Vinnie Langton M.D.   On: 02/20/2017 11:54    Eloise Levels, MD 03/10/2017, 7:10 PM PGY-2, [place] [facility] pager: [phone], text pages welcome

## 2017-02-28 NOTE — ED Provider Notes (Signed)
Medical screening examination/treatment/procedure(s) were conducted as a shared visit with non-physician practitioner(s) and myself.  I personally evaluated the patient during the encounter.  Cardiology secondary to dyspnea and hypoxia.  Patient's been out of his oxygen however has had worsening dyspnea on exertion, lower lip swelling and weight gain over the last month or 2.  Was recently admitted here for tachycardia and had a follow-up appointment today where he was found to be hypoxic so sent here. On exam he has decreased breath sounds, lower extremity edema, tachypnea and mild respiratory distress.  Heart rate and blood pressure were normal.  Sounds like the patient may be having some intermittent episodes of tachycardia however these were not witnessed by [place].  He states that he is on oxygen as needed at home but recently has been on it continuously secondary to his dyspnea. Suspect patient likely has fluid overload with suggest evaluation for the same.  May be need to be admitted secondary to his low oxygen level and need for continuous oxygen and significant weight gain of over 30 pounds in the last couple months.   EKG Interpretation  Date/Time:  Thursday February 28 2017 10:02:12 EDT Ventricular Rate:  81 PR Interval:    QRS Duration: 138 QT Interval:  411 QTC Calculation: 478 R Axis:   28 Text Interpretation:  Atrial flutter Ventricular premature complex Nonspecific intraventricular conduction delay Nonspecific T abnrm, anterolateral leads Confirmed by Merrily Pew [phone]) on 03/01/2017 2:47:07 PM         Bonna Steury, Corene Cornea, MD 03/06/2017 1451

## 2017-02-28 NOTE — Progress Notes (Signed)
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 He has a history of chronic combined CHF with a EF 40-45% Sept 2018 and grade 1 DD. He has significant  COPD, and OSA on C-pap. In 2017 he had some problems with anemia and a GI work up by Dr Benson [place] revealed Crohn's colitis. He was placed on steroids. In Sept 2017 he had severe anemia and he required transfusion.  He saw Dr Sallyanne Kuster in the office 01/04/17 and was noted to be in atrial flutter vs MAT. Dr Sallyanne Kuster recommend starting anticoagulation and setting up TEE guided cardioversion. The pt was reluctant to start this secondary to his severe anemia requiring transfusion in 2017. Dr Sallyanne Kuster felt that in the long run he'd probably do best with radiofrequency ablation, because of his risk for recurrent bleeding secondary to Crohn's. He saw Dre Crenshaw 48 hrs later in follow up (01/06/17) and the pt was ion NSR. Low dose beta blocker was added, he hasn't tolerated high doses in the past. An echo was done 01/15/17 and was unchanged from previous echo's- ERF 40-45%.  The pt had been doing well until a few weeks ago when he noted increased SOB than usual. He presented to the ED 02/20/17 with tachycardia and SOB. He was actually seen at his orthopedist office for knee replacement and they noted his HR was 140. In the ED he was given Lasix and Diltiazem. We discharged him the next day and cleared him for knee surgery. His discharge wgt was 245 lbs, wgt today 258 lbs. He tells me he felt OK for 24 hrs after discharge then noted increasing tachycardia and SOB. He doubled his Lasix and took a Zaroxolyn with no improvement. I had him double his Diltiazem over the phone last night and his rate is 100 in  the office. He is markedly SOB, on O2-"I can't go on like this".    Current Outpatient Prescriptions  Medication Sig Dispense Refill  . acetaminophen (TYLENOL) 650 MG CR tablet Take 650 mg by mouth every 8 (eight) hours as needed for pain.    [name] albuterol (PROAIR HFA) 108 (90 BASE) MCG/ACT inhaler Inhale 1 puff into the lungs daily. And prn    . albuterol (PROVENTIL) (2.5 MG/3ML) 0.083% nebulizer solution Take 2.5 mg by nebulization every 4 (four) hours as needed for wheezing.    [name] allopurinol (ZYLOPRIM) 300 MG tablet Take 300 mg by mouth daily.    [name] amoxicillin-clavulanate (AUGMENTIN) 875-125 MG tablet Take 1 tablet by mouth 2 (two) times daily. 20 tablet 0  . atorvastatin (LIPITOR) 20 MG tablet Take 20 mg by mouth daily.     . cetirizine (ZYRTEC) 10 MG tablet Take 10 mg by mouth daily.    . ciprofloxacin (CIPRO) 500 MG tablet Take 500 mg by mouth 3 (three) times daily.    . Cyanocobalamin (VITAMIN B 12) 100 MCG LOZG Take 2,500 mcg by mouth daily.     [name] diltiazem (CARDIZEM CD) 360 MG 24 hr capsule Take 360 mg by mouth daily.    . Fluticasone-Salmeterol (ADVAIR) 250-50 MCG/DOSE AEPB Inhale 1 puff into the lungs daily.    . furosemide (LASIX) 40 MG tablet Take 1 tablet (40 mg total) by mouth  daily. 30 tablet 5  . ipratropium-albuterol (DUONEB) 0.5-2.5 (3) MG/3ML SOLN Inhale 3 mLs into the lungs every 6 (six) hours as needed (wheezing or SOB).     [name] metolazone (ZAROXOLYN) 2.5 MG tablet take 1 tablet by mouth 30 MINUTES PRIOR TO FUROSEMIDE IF WEIGHT IS GREATER THAN 240 LBS 30 tablet 5  . metoprolol succinate (TOPROL XL) 25 MG 24 hr tablet Take 1 tablet (25 mg total) by mouth daily. 30 tablet 5  . montelukast (SINGULAIR) 10 MG tablet Take 10 mg by mouth at bedtime.    [name] omeprazole (PRILOSEC) 40 MG capsule Take 40 mg by mouth daily.     . potassium chloride (KLOR-CON M10) 10 MEQ tablet Take 2 tablets (20 mEq total) by mouth 2 (two) times daily. 360 tablet 3  . tiotropium (SPIRIVA HANDIHALER) 18 MCG  inhalation capsule Place 1 Inhaler into inhaler and inhale daily.    [name] tolterodine (DETROL LA) 4 MG 24 hr capsule Take 4 mg by mouth daily.    . Urea 50 % CREA Apply 1 application topically daily as needed (psoriasis).   0  . ustekinumab (STELARA) 90 MG/ML SOSY injection Inject 90 mg into the skin every 8 (eight) weeks.     [name] zolpidem (AMBIEN) 10 MG tablet Take 10 mg by mouth at bedtime as needed for sleep.      No current facility-administered medications for this visit.     Allergies  Allergen Reactions  . Lipitor [Atorvastatin] Other (See Comments)    Myalgia  . Angiotensin Receptor Blockers Other (See Comments)    Hyperkalemia    Past Medical History:  Diagnosis Date  . Arthritis   . Borderline diabetes   . Cancer [facility]) 2008   Prostate cancer- s/p Rob. prostate surgery '08  . COPD (chronic obstructive pulmonary disease) ([place])   . Coronary artery disease   . Crohn's disease ([place])   . Dysrhythmia    ? near syncope episodes, arhythmia -not proven cause- symptoms had stop when monitored with continuous rhythm monitor.  [name] GERD (gastroesophageal reflux disease)   . History of home oxygen therapy    2 liters prn and at hs  . Hyperlipidemia   . Hypertension   . Ischemic cardiomyopathy    Ejection fraction 40-45%  . Myocardial infarction [facility]) 2008   x1  . Obesity, Class II, BMI 35-39.9   . Obstructive sleep apnea on CPAP    uses cpap setting of 10  . Psoriasis   . S/P CABG x 4 2007   Dr.  [name]   . Tobacco abuse   . Transfusion history    x 1  -s/p CABPG    Social History   Social History  . Marital status: Married    Spouse name: N/A  . Number of children: N/A  . Years of education: N/A   Occupational History  . Not on file.   Social History Main Topics  . Smoking status: Former Smoker    Packs/day: 2.00    Years: 30.00    Types: Cigarettes    Quit date: 04/30/2000  . Smokeless tobacco: Never Used  . Alcohol use 0.0 oz/week     Comment: occ. to rare    . Drug use: No  . Sexual activity: Not on file   Other Topics Concern  . Not on file   Social History Narrative  . No narrative on file     Family History  Problem Relation Age of Onset  .  Heart attack Father      Review of Systems: General: negative for chills, fever, night sweats or weight changes.  Cardiovascular: negative for chest pain Dermatological: negative for rash Respiratory: negative for cough or wheezing Urologic: negative for hematuria Abdominal: negative for nausea, vomiting, diarrhea, bright red blood per rectum, melena, or hematemesis Neurologic: negative for visual changes, syncope, or dizziness All other systems reviewed and are otherwise negative except as noted above.    Height 5\' 9"  (1.753 m), weight 258 lb (117 kg).  General appearance: alert, cooperative, moderate distress, morbidly obese and pale Neck: no carotid bruit Lungs: decreased breath sounds overall Heart: irregularly irregular rhythm and decreased heart sounds Abdomen: morbid obesity Extremities: trace edema Pulses: 2+ and symmetric Skin: pale cool dry Neurologic: Grossly normal  EKG A flutter with variable VR- rate 100, PVCs also noted  ASSESSMENT AND PLAN:   Acute on chronic combined systolic and diastolic CHF (congestive heart failure) (HCC) wgt up 18 lbs from baseline- check BNP, diurese  Recurrent atrial flutter with 2:1 conduction-  Anticoagulation is an issue secondary to h/o GI bleed, colitis requiring transfusion.   Cardiomyopathy, ischemic, ejection fraction 40-45% EF 40-45% by echo Sept 2018  S/P CABG x 4, 2007 CABG x 4 2007, cath 2008- occluded SVG-OM and RI, Myoview low risk 2016- medical Rx  Crohn's colitis [facility]) Diagnosed June 2017, transfused Sept 2017-Dr Hung  COPD- pt is also morbidly obese and has sleep apnea, C-pap compliant  PLAN  Will send to the hospital for IV diuresis and further evaluation.  Kerin Ransom PA-C 03/07/2017 8:51 AM

## 2017-02-28 NOTE — ED Provider Notes (Signed)
Assumed care of the patient at 1600.  Briefly patient was sent from cardiology clinic after a follow-up visit from his recent admission to their service.  He was noted to be hypoxic into the 70s and had his oxygen increased and was sent to the emergency department.  The patient has been complaining of worsening edema to his lower extremities.  He was given 80 of Lasix.  Has had significant urinary output.  Feels very minimally better.  On my exam the patient has JVD up to the mid neck.  Diminished lung sounds in the bases.  No third heart sound.  Trace peripheral edema bilaterally.  The patient was awaiting cardiology evaluation.  The patient was reassessed with still no bed requested in place.  No note from the cardiologist.  His nurse page the on-call cardiologist who did not feel that the patient need to be seen by them acutely and recommended hospitalist admission with cardiology consult in the morning.  The patients results and plan were reviewed and discussed.   Any x-rays performed were independently reviewed by myself.   Differential diagnosis were considered with the presenting HPI.  Medications  furosemide (LASIX) injection 80 mg (80 mg Intravenous Given 03/01/2017 1040)    Vitals:   03/12/2017 1645 03/02/2017 1830 03/19/2017 1845 03/26/2017 1900  BP: 130/71 122/72 112/87 124/71  Pulse: 83 60 (!) 51 81  Resp: (!) 21 20 20 17   Temp:      TempSrc:      SpO2: 97% 96% 98% 95%  Weight:      Height:        Final diagnoses:  Acute respiratory failure with hypoxia (HCC)  Acute combined systolic and diastolic congestive heart failure (HCC)    Admission/ observation were discussed with the admitting physician, patient and/or family and they are comfortable with the plan.     Deno Etienne, DO 03/07/2017 1949

## 2017-03-01 ENCOUNTER — Encounter (HOSPITAL_COMMUNITY): Payer: Self-pay | Admitting: Physician Assistant

## 2017-03-01 DIAGNOSIS — I255 Ischemic cardiomyopathy: Secondary | ICD-10-CM

## 2017-03-01 DIAGNOSIS — J438 Other emphysema: Secondary | ICD-10-CM

## 2017-03-01 DIAGNOSIS — J9601 Acute respiratory failure with hypoxia: Secondary | ICD-10-CM

## 2017-03-01 DIAGNOSIS — I484 Atypical atrial flutter: Secondary | ICD-10-CM

## 2017-03-01 DIAGNOSIS — G4733 Obstructive sleep apnea (adult) (pediatric): Secondary | ICD-10-CM

## 2017-03-01 DIAGNOSIS — I509 Heart failure, unspecified: Secondary | ICD-10-CM

## 2017-03-01 DIAGNOSIS — I5041 Acute combined systolic (congestive) and diastolic (congestive) heart failure: Secondary | ICD-10-CM

## 2017-03-01 LAB — COMPREHENSIVE METABOLIC PANEL
ALBUMIN: 3.2 g/dL — AB (ref 3.5–5.0)
ALT: 50 U/L (ref 17–63)
AST: 24 U/L (ref 15–41)
Alkaline Phosphatase: 79 U/L (ref 38–126)
Anion gap: 9 (ref 5–15)
BUN: 15 mg/dL (ref 6–20)
CHLORIDE: 95 mmol/L — AB (ref 101–111)
CO2: 34 mmol/L — AB (ref 22–32)
CREATININE: 1.12 mg/dL (ref 0.61–1.24)
Calcium: 9.2 mg/dL (ref 8.9–10.3)
GFR calc Af Amer: >60 mL/min (ref >60)
Glucose, Bld: 138 mg/dL — ABNORMAL HIGH (ref 65–99)
POTASSIUM: 3.8 mmol/L (ref 3.5–5.1)
SODIUM: 138 mmol/L (ref 135–145)
Total Bilirubin: 0.8 mg/dL (ref 0.3–1.2)
Total Protein: 6.3 g/dL — ABNORMAL LOW (ref 6.5–8.1)

## 2017-03-01 LAB — CBC
HCT: 34.3 % — ABNORMAL LOW (ref 39.0–52.0)
Hemoglobin: 10.8 g/dL — ABNORMAL LOW (ref 13.0–17.0)
MCH: 30.3 pg (ref 26.0–34.0)
MCHC: 31.5 g/dL (ref 30.0–36.0)
MCV: 96.1 fL (ref 78.0–100.0)
Platelets: 365 K/uL (ref 150–400)
RBC: 3.57 MIL/uL — ABNORMAL LOW (ref 4.22–5.81)
RDW: 15 % (ref 11.5–15.5)
WBC: 8.7 K/uL (ref 4.0–10.5)

## 2017-03-01 LAB — TROPONIN I
TROPONIN I: 0.05 ng/mL — AB (ref <0.03)
Troponin I: 0.05 ng/mL

## 2017-03-01 MED ORDER — METOPROLOL TARTRATE 25 MG PO TABS
12.5000 mg | ORAL_TABLET | Freq: Two times a day (BID) | ORAL | Status: DC
  Filled 2017-03-01: qty 1

## 2017-03-01 MED ORDER — FLUTICASONE PROPIONATE HFA 220 MCG/ACT IN AERO: 2.0000 | INHALATION_SPRAY | Freq: Two times a day (BID) | RESPIRATORY_TRACT | Status: DC

## 2017-03-01 MED ORDER — METOPROLOL TARTRATE 12.5 MG HALF TABLET
12.5000 mg | ORAL_TABLET | Freq: Two times a day (BID) | ORAL | Status: DC
  Administered 2017-03-01 – 2017-03-02 (×3): 12.5 mg via ORAL
  Filled 2017-03-01 (×2): qty 1

## 2017-03-01 MED ORDER — LIVING BETTER WITH HEART FAILURE BOOK: Freq: Once | Status: DC

## 2017-03-01 MED ORDER — BUDESONIDE 0.5 MG/2ML IN SUSP
0.5000 mg | Freq: Two times a day (BID) | RESPIRATORY_TRACT | Status: DC
  Administered 2017-03-01 – 2017-03-07 (×14): 0.5 mg via RESPIRATORY_TRACT
  Filled 2017-03-01 (×14): qty 2

## 2017-03-01 MED ORDER — DILTIAZEM HCL 100 MG IV SOLR
5.0000 mg/h | INTRAVENOUS | Status: DC
  Administered 2017-03-01: 10 mg/h via INTRAVENOUS
  Administered 2017-03-01: 5 mg/h via INTRAVENOUS
  Administered 2017-03-02 – 2017-03-03 (×4): 10 mg/h via INTRAVENOUS
  Filled 2017-03-01 (×7): qty 100

## 2017-03-01 MED ORDER — FUROSEMIDE 10 MG/ML IJ SOLN
80.0000 mg | Freq: Two times a day (BID) | INTRAMUSCULAR | Status: DC
  Administered 2017-03-01 – 2017-03-03 (×4): 80 mg via INTRAVENOUS
  Filled 2017-03-01 (×4): qty 8

## 2017-03-01 MED ORDER — FUROSEMIDE 10 MG/ML IJ SOLN
80.0000 mg | Freq: Once | INTRAMUSCULAR | Status: AC
  Administered 2017-03-01: 80 mg via INTRAVENOUS
  Filled 2017-03-01: qty 8

## 2017-03-01 MED ORDER — POTASSIUM CHLORIDE CRYS ER 20 MEQ PO TBCR
40.0000 meq | EXTENDED_RELEASE_TABLET | Freq: Once | ORAL | Status: AC
  Administered 2017-03-01: 40 meq via ORAL
  Filled 2017-03-01: qty 2

## 2017-03-01 MED ORDER — POTASSIUM CHLORIDE CRYS ER 20 MEQ PO TBCR
20.0000 meq | EXTENDED_RELEASE_TABLET | Freq: Two times a day (BID) | ORAL | Status: DC
  Administered 2017-03-02 – 2017-03-07 (×12): 20 meq via ORAL
  Filled 2017-03-01 (×12): qty 1

## 2017-03-01 MED ORDER — ACETAMINOPHEN 325 MG PO TABS
650.0000 mg | ORAL_TABLET | Freq: Four times a day (QID) | ORAL | Status: DC | PRN
  Administered 2017-03-02 – 2017-03-05 (×4): 650 mg via ORAL
  Filled 2017-03-01 (×4): qty 2

## 2017-03-01 NOTE — Progress Notes (Signed)
Progress Note  Patient Name: Jay [name] Date of Encounter: 03/01/2017  Primary Cardiologist: Dr. Sallyanne Kuster  Subjective   Reports excellent UOP (filling up a whole jug every time he goes) but states he doesn't feel much better yet. Watches his sodium intake and uses CPAP but thinks he drinks more fluid than 2L per day. Does not weigh every day.  Inpatient Medications    Scheduled Meds: . allopurinol  300 mg Oral Daily  . atorvastatin  20 mg Oral Daily  . budesonide (PULMICORT) nebulizer solution  0.5 mg Nebulization BID  . fesoterodine  4 mg Oral Daily  . heparin  5,000 Units Subcutaneous Q8H  . Living Better with Heart Failure Book   Does not apply Once  . loratadine  10 mg Oral Daily  . metoprolol tartrate  12.5 mg Oral BID  . pantoprazole  40 mg Oral Daily  . tiotropium  18 mcg Inhalation Daily   Continuous Infusions: . diltiazem (CARDIZEM) infusion 10 mg/hr (03/01/17 1105)   PRN Meds:    Vital Signs    Vitals:   03/01/17 1114 03/01/17 1200 03/01/17 1344 03/01/17 1400  BP: 111/67 131/61 131/61 126/77  Pulse: (!) 135 (!) 39 86 (!) 39  Resp: (!) 29 20 (!) 24 16  Temp:      TempSrc:      SpO2: 97% 95% 96% 97%  Weight:      Height:        Intake/Output Summary (Last 24 hours) at 03/01/17 1445 Last data filed at 03/01/17 1117  Gross per 24 hour  Intake                0 ml  Output             1750 ml  Net            -1750 ml   Filed Weights   03/15/2017 1014  Weight: 258 lb (117 kg)    Telemetry    Persistent atrial flutter HR 80-100s - Personally Reviewed  Physical Exam   GEN: Obese WM ruddy complexion no acute distress.  HEENT: Normocephalic, atraumatic, sclera non-icteric. Neck: No JVD or bruits. Cardiac: Irregularly irregular, no murmurs, rubs, or gallops.  Radials/DP/PT 1+ and equal bilaterally.  Respiratory: Crackles at bases L>R, decreased BS throughout. Mildly increased WOB GI: Distended but still pliable, non-distended, BS +x 4. MS: no  deformity. Extremities: No clubbing or cyanosis. 1+ BLE pitting edema up to knees. Distal pedal pulses are 2+ and equal bilaterally. Neuro:  AAOx3. Follows commands. Psych:  Responds to questions appropriately with a normal affect.  Labs    Chemistry  Recent Labs Lab 03/28/2017 1028 03/06/2017 2248 03/01/17 0343  NA 139  --  138  K 3.9  --  3.8  CL 99*  --  95*  CO2 30  --  34*  GLUCOSE 122*  --  138*  BUN 13  --  15  CREATININE 1.04 1.56* 1.12  CALCIUM 8.7*  --  9.2  PROT  --   --  6.3*  ALBUMIN  --   --  3.2*  AST  --   --  24  ALT  --   --  50  ALKPHOS  --   --  79  BILITOT  --   --  0.8  GFRNONAA >60 45* >60  GFRAA >60 52* >60  ANIONGAP 10  --  9     Hematology  Recent Labs Lab 03/20/2017 1028  03/01/2017 2248 03/01/17 0343  WBC 9.6 10.0 8.7  RBC 3.44* 3.73* 3.57*  HGB 10.4* 11.2* 10.8*  HCT 32.8* 35.6* 34.3*  MCV 95.3 95.4 96.1  MCH 30.2 30.0 30.3  MCHC 31.7 31.5 31.5  RDW 15.0 14.9 15.0  PLT 339 351 365    Cardiac Enzymes  Recent Labs Lab 03/18/2017 2248 03/01/17 0343  TROPONINI 0.05* 0.05*     Recent Labs Lab 03/15/2017 1040  TROPIPOC 0.02     BNP  Recent Labs Lab 03/05/2017 1028  BNP 367.1*    Radiology    Dg Chest 2 View  Result Date: 03/28/2017 CLINICAL DATA:  Shortness of breath, CHF, worse over the last month EXAM: CHEST  2 VIEW COMPARISON:  Chest x-ray of 02/20/2017 FINDINGS: There is cardiomegaly present with some fullness of the perihilar vasculature possibly indicating mild pulmonary vascular congestion, without definite congestive heart failure. Tiny pleural effusions cannot be excluded with some blunting of the posterior costophrenic angles. No pneumonia is seen. Median sternotomy sutures are present from prior CABG. No acute bony abnormality is seen. IMPRESSION: Cardiomegaly.  Probable mild pulmonary vascular congestion. Electronically Signed   By: Ivar Drape M.D.   On: 03/12/2017 11:07    Cardiac Studies   2D Echo 01/15/17 Study  Conclusions - Left ventricle: The cavity size was moderately dilated. Wall   thickness was increased in a pattern of mild LVH. Systolic   function was mildly to moderately reduced. The estimated ejection   fraction was in the range of 40% to 45%. Diffuse hypokinesis.   Doppler parameters are consistent with abnormal left ventricular   relaxation (grade 1 diastolic dysfunction). Doppler parameters   are consistent with high ventricular filling pressure. - Aortic valve: There was trivial regurgitation. - Left atrium: The atrium was mildly dilated. - Right ventricle: The cavity size was mildly dilated. - Right atrium: The atrium was mildly dilated. Impressions: - Mild to moderate global reduction in LV systolic function; mild   diastolic dysfunction with elevated LV fillin pressure; moderate   LVE; mild LVH; mild biatrial enlargement; mild RVE.  Patient Profile     66 y.o. 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 He saw Dr. Sallyanne Kuster in the office 01/04/17 and was noted to be in atrial flutter vs MAT. Dr Sallyanne Kuster recommend starting anticoagulation and setting up TEE guided cardioversion. The pt was reluctant to start anticoagulation secondary to his severe anemia requiring transfusion in 2017. Dr Sallyanne Kuster felt that in the long run he'dprobably do best with radiofrequency ablation, because of hisrisk for recurrent bleeding secondary to Crohn's. When seen in follow-up 9/9 he was in NSR. 2D echo 01/15/17: mild LVH, EF 40-45% (similar to 2015), grade 1 DD, mild LAE, mild RV/RA dilation. However, he was recently readmitted 10/24 with atrial flutter RVR and a/c combined CHF. Given impending knee surgery, decision was made to delay anticoagulation until felt safe - discharged in atrial flutter HR 90s-100s.  See in office 11/1 with marked dyspnea, 18lb weight gain, tachycardic despite outpatient doubling of Lasix/added metolazone. Recent TSH wnl. CHADSVASC 3. Office weight 05/2016 248lb and feeling well; discharged at 245lb on 10/25.   Assessment & Plan   1. Persistent atrial flutter - HR currently 90-100 on 10mg /hr of IV diltiazem, is also on Lopressor 12.5mg  BID. Therapies for  his atrial flutter have been limited due to the complex anticoagulation question - patient has previously been understandably reluctant given h/o prior significant anemia/Crohn's disease, and was supposed to be pending upcoming knee surgery. There have been prior discussions about ablation with short-term anticoagulation thereafter. Left atrium is mildly dilated but his obesity would make risk of recurrent arrhythmias more likely. Long-term it may be worthwhile to try transitioning off calcium channel blocker and onto metoprolol but success of this as well may be limited by COPD. Ultimately I feel he would benefit from EP consultation to help assist with these complex questions. I will discuss further eval with Dr. Percival Spanish.  2. Acute on chronic combined CHF - not yet weighed today. Reports excellent UOP (-3L recorded so far) but remains volume overloaded. Will continue Lasix 80mg  IV BID. Add KCl. Discussed daily weights, low sodium diet, fluid restriction; will rx CHF book.  3. CAD - mildly elevated troponin elevation, suspected to be demand ischemia. No recent CP. Could consider updating ischemic eval once more compensated to exclude contributing factor driving CHF and atrial arrhythmia. However, even at present time he is not even on aspirin. Continue statin (has had myalgias with higher dose), last LDL 32.  4. Anemia - known for patient. Per outside records, prior Hgb down to 9.7 in 01/2016, then 13 in 10/2016, more recently in 11 range. Follow.  Signed, Melina Copa PA-C (pager [phone]) 03/01/2017, 2:45 PM     History and all  data above reviewed.  Patient examined.  I agree with the findings as above.  Breathing is not improved despite good diuresis.  Still with obvious volume overload.  Rate is controlled on IV Dilt.   The patient exam reveals IRS:[id number]  ,  Lungs: Decreased breath sounds  ,  Abd: Positive bowel sounds, no rebound no guarding, Ext . Moderate edema to thighs.  Neck:  JVD to jaw at 45 degrees. .  All available labs, radiology testing, previous records reviewed. Agree with documented assessment and plan. Acute respiratory failure:  Multifactorial.  He has chronic lung disease and probable blebs on CXR (reviewed with radiology).  He has marked volume overload by exam.   Will continue IV diuresis with 80 mg IV bid.  Also, would consider atrial flutter ablation and he could have TEE with short course of anticoagulation.  He would agree to this.  We will discuss with EP.  Elevated troponin.  Likely secondary to demand ischemia.  No other work up.     Jeneen Rinks Janesia Joswick  3:14 PM  03/01/2017

## 2017-03-01 NOTE — Progress Notes (Signed)
Reviewed AC with Dr. Percival Spanish, he recommends to await EP input before initiation. Taron Conrey PA-C

## 2017-03-01 NOTE — Progress Notes (Signed)
Family Medicine Teaching Service Daily Progress Note Intern Pager: [phone]  Patient name: Jay [name] Medical record number: [id number] Date of birth: 12/20/1950 Age: 66 y.o. Gender: male  Primary Care Provider: Cathlean Sauer, MD Consultants: cards Code Status: DNR  Pt Overview and Major Events to Date:  Jay [name] is a 66 y.o. male presenting with acute hypoxic respiratory failure. PMH is significant for psoriatic arthritis, COPD, Crohn's disease, atrial fibrillation (no anticoagulation), status post CABG x4, OSA on CPAP, chronic combined congestive heart failure   Assessment and Plan: Jay [name] is a 66 y.o. male presenting with acute hypoxic respiratory failure. PMH is significant for psoriatic arthritis, COPD, Crohn's disease, atrial fibrillation (no anticoagulation), status post CABG x4, OSA on CPAP, chronic combined congestive heart failure   Combined congestive heart failure exacerbation with acute hypoxic respiratory failure (states dry weight 234)-minimally improved overnight EF of 40-45 September 2018 with grade 1 diastolic dysfunction. Exacerbation is most likely secondary to the persistent tachycardia for the past month.  Was given Lasix 80 mg IV and had 2.2 L output since presented to the emergency department.  -Admit to telemetry under attending Mc[place] -Continuous pulse ox (94 during rounds at 3L) -Cardiology consulted; plan to see in the a.m. -home Toprol XL 25 mg daily changed to succinate 12.5bid -Lasix 80 mg ordered x1 am11/2, should have cards recs by this afternoon -Trend troponins (0.05 x2, 0.02 istat in ED) -Strict I's and O's -Daily weights  Atrial fibrillation, rate controlled, no anti-coagulation- uncontrolled on rounds 130 Per chart review, patient has been having recurrent A. fib with RVR and is not receiving any anticoagulation due to his Crohn's disease.  EKG showing a flutter in the emergency department upon arrival.   -Trending  troponins as above -home Toprol XL 25 mg daily changed to succinate 12.5bid -change  diltiazem 180 mg BID XR to dilt drip -Cardiology consulted  COPD with oxygen requirement- satting 95 on 3L [place] Uses 2L by [place] at night and as needed at home. No reports of fevers, chills or cough. Takes his medications daily. Former smoker.  -Continuous pulse ox -change Dulera 2 puffs BID to flovent 220 2 puffs BID for concern of HR -home Spiriva daily -Titrate oxygen to >90% O2  Crohn's disease Stable  CAD with history of CABG x4 No chest pain. EKG showing atrial flutter but no signs of ischemia.   OSA -CPAP at night  Hx of prostate cancer w/ prostectomy-  -home fesoterodine for incontinence  FEN/GI: low sodium diet/protonix Prophylaxis: subcutaneous hep  Disposition: admit to telemetry under attending [place]. Cardiology to see in 11/2 AM. (still in ED on morning rounds)  Subjective:  Patient was frustrated with still being in ED.  His HR was still 130 and that was his main complaint although he denied chest pain.  States he generally takes extra lasix when weight gets up but doesn't check his weight daily, he uses subjective ankle swelling.  Felt his breathing has been "off" for 1 month.   He felt his abdomen was "full" and consistent with past CHF exacerbations  Objective: Temp:  [98.7 F (37.1 C)] 98.7 F (37.1 C) (11/01 1016) Pulse Rate:  [34-138] 51 (11/02 0400) Resp:  [12-31] 18 (11/02 0600) BP: (102-158)/(52-102) 128/62 (11/02 0600) SpO2:  [87 %-100 %] 93 % (11/02 0400) Weight:  [258 lb (117 kg)] 258 lb (117 kg) (11/01 1014) Physical Exam: General: 66 year old man sitting up in bed appearing uncomfortable but in no acute distress. Eyes:  Extraocular muscles intact, no scleral icterus ENTM: No nasal drainage, moist mucous membranes and clear oropharynx Cardiovascular: Tachycardic and irregularly irregular, no apparent murmurs Respiratory: increased WOB, minimal crackles in  the bilateral bases with diminished breath sounds, wheezing Gastrointestinal: Positive bowel sounds, nontender no peritoneal signs, firm obese belly MSK: No gross deformities or edema Derm: Warm and dry, no rash Extremities: 2+ pitting edema to the knees bilaterally Neuro: Alert and oriented x3, no focal neurological deficits Psych: Pleasant, normal mood and affect  Laboratory:  Recent Labs Lab 03/02/2017 1028 03/27/2017 2248 03/01/17 0343  WBC 9.6 10.0 8.7  HGB 10.4* 11.2* 10.8*  HCT 32.8* 35.6* 34.3*  PLT 339 351 365    Recent Labs Lab 03/10/2017 1028 03/28/2017 2248 03/01/17 0343  NA 139  --  138  K 3.9  --  3.8  CL 99*  --  95*  CO2 30  --  34*  BUN 13  --  15  CREATININE 1.04 1.56* 1.12  CALCIUM 8.7*  --  9.2  PROT  --   --  6.3*  BILITOT  --   --  0.8  ALKPHOS  --   --  79  ALT  --   --  50  AST  --   --  24  GLUCOSE 122*  --  138*      Imaging/Diagnostic Tests: Dg Chest 2 View  Result Date: 03/13/2017 CLINICAL DATA:  Shortness of breath, CHF, worse over the last month EXAM: CHEST  2 VIEW COMPARISON:  Chest x-ray of 02/20/2017 FINDINGS: There is cardiomegaly present with some fullness of the perihilar vasculature possibly indicating mild pulmonary vascular congestion, without definite congestive heart failure. Tiny pleural effusions cannot be excluded with some blunting of the posterior costophrenic angles. No pneumonia is seen. Median sternotomy sutures are present from prior CABG. No acute bony abnormality is seen. IMPRESSION: Cardiomegaly.  Probable mild pulmonary vascular congestion. Electronically Signed   By: Ivar Drape M.D.   On: 03/20/2017 11:07     Sherene Sires, DO 03/01/2017, 7:33 AM PGY-1, [place] Intern pager: [phone], text pages welcome

## 2017-03-01 NOTE — ED Notes (Signed)
Cardiology Provider at bed side

## 2017-03-01 NOTE — Progress Notes (Signed)
Pt stated he doesn't want to wear CPAP 

## 2017-03-01 NOTE — Consult Note (Deleted)
Wrong note type. Nomi Rudnicki PA-C

## 2017-03-01 NOTE — ED Notes (Signed)
Pt ambulated to restroom for bowel mov't.

## 2017-03-01 NOTE — ED Notes (Signed)
Pt given refreshments, sitting on side of bed for comfort.

## 2017-03-02 DIAGNOSIS — I483 Typical atrial flutter: Principal | ICD-10-CM

## 2017-03-02 DIAGNOSIS — I5023 Acute on chronic systolic (congestive) heart failure: Secondary | ICD-10-CM

## 2017-03-02 LAB — BASIC METABOLIC PANEL
Anion gap: 11 (ref 5–15)
BUN: 15 mg/dL (ref 6–20)
CHLORIDE: 91 mmol/L — AB (ref 101–111)
CO2: 34 mmol/L — AB (ref 22–32)
CREATININE: 1.16 mg/dL (ref 0.61–1.24)
Calcium: 8.9 mg/dL (ref 8.9–10.3)
GFR calc non Af Amer: >60 mL/min (ref >60)
Glucose, Bld: 125 mg/dL — ABNORMAL HIGH (ref 65–99)
Potassium: 3.6 mmol/L (ref 3.5–5.1)
Sodium: 136 mmol/L (ref 135–145)

## 2017-03-02 LAB — MAGNESIUM: MAGNESIUM: 1.5 mg/dL — AB (ref 1.7–2.4)

## 2017-03-02 LAB — MRSA PCR SCREENING: MRSA BY PCR: NEGATIVE

## 2017-03-02 LAB — CBC
HCT: 33.9 % — ABNORMAL LOW (ref 39.0–52.0)
Hemoglobin: 10.7 g/dL — ABNORMAL LOW (ref 13.0–17.0)
MCH: 29.9 pg (ref 26.0–34.0)
MCHC: 31.6 g/dL (ref 30.0–36.0)
MCV: 94.7 fL (ref 78.0–100.0)
PLATELETS: 379 10*3/uL (ref 150–400)
RBC: 3.58 MIL/uL — AB (ref 4.22–5.81)
RDW: 14.9 % (ref 11.5–15.5)
WBC: 10.6 10*3/uL — AB (ref 4.0–10.5)

## 2017-03-02 MED ORDER — ORAL CARE MOUTH RINSE
15.0000 mL | Freq: Two times a day (BID) | OROMUCOSAL | Status: DC
  Administered 2017-03-02 – 2017-03-06 (×4): 15 mL via OROMUCOSAL

## 2017-03-02 MED ORDER — METOPROLOL TARTRATE 25 MG PO TABS
25.0000 mg | ORAL_TABLET | Freq: Two times a day (BID) | ORAL | Status: DC
  Administered 2017-03-02 – 2017-03-06 (×8): 25 mg via ORAL
  Filled 2017-03-02 (×8): qty 1

## 2017-03-02 MED ORDER — METOPROLOL TARTRATE 12.5 MG HALF TABLET
12.5000 mg | ORAL_TABLET | Freq: Once | ORAL | Status: DC
  Filled 2017-03-02 (×2): qty 1

## 2017-03-02 NOTE — Progress Notes (Signed)
Family Medicine Teaching Service Daily Progress Note Intern Pager: [phone]  Patient name: Jay [name] Medical record number: [id number] Date of birth: March 16, 1951 Age: 66 y.o. Gender: male  Primary Care Provider: Cathlean Sauer, MD Consultants: cards Code Status: DNR  Pt Overview and Major Events to Date:  Jay [name] is a 66 y.o. male presenting with acute hypoxic respiratory failure. PMH is significant for psoriatic arthritis, COPD, Crohn's disease, atrial fibrillation (no anticoagulation), status post CABG x4, OSA on CPAP, chronic combined congestive heart failure   Assessment and Plan: Jay [name] is a 66 y.o. male presenting with acute hypoxic respiratory failure. PMH is significant for psoriatic arthritis, COPD, Crohn's disease, atrial fibrillation (no anticoagulation), status post CABG x4, OSA on CPAP, chronic combined congestive heart failure   Combined CHF exacerbation with acute hypoxic respiratory failure: EF 40-45% with G1DD (9/18). Dry weight=234 lbs. Exacerbation is most likely secondary to the persistent tachycardia for the past month. Minimally elevated trop (0.05) thought to be secondary to demand ischemia. Requiring 3-4L O2 by [place].  Weight is down 8lbs (254>246lbs) and UOP 3.9L.  -telemetry  -Lasix 80 mg IV BID  -Continuous pulse ox and supplemental O2 prn  -Cardiology consulted; appreciate recs  -home Toprol XL 25 mg daily changed to succinate 12.5bid -Strict I's and O's -Daily weights  AFib: Currently rate controlled on diltiazem drip. Per chart review, patient has been having recurrent A. fib with RVR and is not receiving any anticoagulation due to his Crohn's disease.  -home Toprol XL 25 mg daily changed to succinate 12.5bid -continue diltiazem drip  -Cardiology consulted; considering EP consult   COPD: 2L by [place] at night and prn at home. No evidence of exacerbation. Former smoker.  -Continuous pulse ox -change Dulera 2 puffs BID to flovent 220 2  puffs BID for concern of HR -home Spiriva daily -Titrate oxygen to >90% O2  Crohn's disease Stable  CAD with history of CABG x4 No chest pain. EKG showing atrial flutter but no signs of ischemia.   OSA -CPAP at night  Hx of prostate cancer w/ prostectomy-  -home fesoterodine for incontinence  FEN/GI: low sodium diet/protonix Prophylaxis: subcutaneous hep  Disposition: Pending adequate diuresis and sustained rate control for Afib  Subjective:  Reports still feeling poorly but much better from yesterday. "Lots of fluid off but still more to go".   Objective: Temp:  [97.7 F (36.5 C)-99 F (37.2 C)] 97.8 F (36.6 C) (11/03 0740) Pulse Rate:  [39-135] 82 (11/03 0740) Resp:  [16-29] 22 (11/03 0740) BP: (102-131)/(57-113) 106/60 (11/03 0740) SpO2:  [91 %-97 %] 93 % (11/03 0740) Weight:  [246 lb 9.6 oz (111.9 kg)-254 lb 13.6 oz (115.6 kg)] 246 lb 9.6 oz (111.9 kg) (11/03 0500) Physical Exam: General: 66 year old man sitting on side of bed in NAD  ENTM: No nasal drainage, moist mucous membranes and clear oropharynx Cardiovascular: Tachycardic and irregularly irregular, no apparent murmurs Respiratory: increased WOB, crackles in the bilateral bases with diminished breath sounds Extremities: 2+ pitting edema to the knees bilaterally Neuro: Alert and oriented x3, no focal neurological deficits Psych: Pleasant, normal mood and affect  Laboratory:  Recent Labs Lab 03/04/2017 2248 03/01/17 0343 03/02/17 0337  WBC 10.0 8.7 10.6*  HGB 11.2* 10.8* 10.7*  HCT 35.6* 34.3* 33.9*  PLT 351 365 379    Recent Labs Lab 03/21/2017 1028 03/13/2017 2248 03/01/17 0343 03/02/17 0337  NA 139  --  138 136  K 3.9  --  3.8 3.6  CL 99*  --  95* 91*  CO2 30  --  34* 34*  BUN 13  --  15 15  CREATININE 1.04 1.56* 1.12 1.16  CALCIUM 8.7*  --  9.2 8.9  PROT  --   --  6.3*  --   BILITOT  --   --  0.8  --   ALKPHOS  --   --  79  --   ALT  --   --  50  --   AST  --   --  24  --    GLUCOSE 122*  --  138* 125*      Imaging/Diagnostic Tests: Dg Chest 2 View  Result Date: 03/07/2017 CLINICAL DATA:  Shortness of breath, CHF, worse over the last month EXAM: CHEST  2 VIEW COMPARISON:  Chest x-ray of 02/20/2017 FINDINGS: There is cardiomegaly present with some fullness of the perihilar vasculature possibly indicating mild pulmonary vascular congestion, without definite congestive heart failure. Tiny pleural effusions cannot be excluded with some blunting of the posterior costophrenic angles. No pneumonia is seen. Median sternotomy sutures are present from prior CABG. No acute bony abnormality is seen. IMPRESSION: Cardiomegaly.  Probable mild pulmonary vascular congestion. Electronically Signed   By: Ivar Drape M.D.   On: 03/14/2017 11:07     Nicolette Bang, DO 03/02/2017, 8:37 AM PGY-3, [place] Intern pager: [phone], text pages welcome

## 2017-03-02 NOTE — Progress Notes (Addendum)
Progress Note  Patient Name: Jay [name] Date of Encounter: 03/02/2017  Primary Cardiologist: Dr. Sallyanne Kuster  Subjective   Still getting SOB when up ambulating in the room with HRs in the 120's with movement.    Inpatient Medications    Scheduled Meds: . allopurinol  300 mg Oral Daily  . atorvastatin  20 mg Oral Daily  . budesonide (PULMICORT) nebulizer solution  0.5 mg Nebulization BID  . fesoterodine  4 mg Oral Daily  . furosemide  80 mg Intravenous BID  . heparin  5,000 Units Subcutaneous Q8H  . Living Better with Heart Failure Book   Does not apply Once  . loratadine  10 mg Oral Daily  . mouth rinse  15 mL Mouth Rinse BID  . metoprolol tartrate  12.5 mg Oral BID  . pantoprazole  40 mg Oral Daily  . potassium chloride  20 mEq Oral BID  . tiotropium  18 mcg Inhalation Daily   Continuous Infusions: . diltiazem (CARDIZEM) infusion 10 mg/hr (03/02/17 1103)   PRN Meds: acetaminophen   Vital Signs    Vitals:   03/02/17 0852 03/02/17 0857 03/02/17 0949 03/02/17 1132  BP:   102/67 116/69  Pulse:   88 (!) 43  Resp:    20  Temp:    97.7 F (36.5 C)  TempSrc:    Oral  SpO2: 95% 96%  95%  Weight:      Height:        Intake/Output Summary (Last 24 hours) at 03/02/17 1203 Last data filed at 03/02/17 1134  Gross per 24 hour  Intake          1276.25 ml  Output             3350 ml  Net         -2073.75 ml   Filed Weights   03/13/2017 1014 03/01/17 1409 03/02/17 0500  Weight: 258 lb (117 kg) 254 lb 13.6 oz (115.6 kg) 246 lb 9.6 oz (111.9 kg)    Telemetry    Atrial flutter with RVR- Personally Reviewed  ECG    No new EKG to review - Personally Reviewed  Physical Exam   GEN: No acute distress.   Neck: No JVD Cardiac: irregularly irregular, no murmurs, rubs, or gallops.  Respiratory: crackles at bases bilaterally GI: Soft, nontender, non-distended  MS: No edema; No deformity. Neuro:  Nonfocal  Psych: Normal affect   Labs    Chemistry Recent  Labs Lab 03/17/2017 1028 03/02/2017 2248 03/01/17 0343 03/02/17 0337  NA 139  --  138 136  K 3.9  --  3.8 3.6  CL 99*  --  95* 91*  CO2 30  --  34* 34*  GLUCOSE 122*  --  138* 125*  BUN 13  --  15 15  CREATININE 1.04 1.56* 1.12 1.16  CALCIUM 8.7*  --  9.2 8.9  PROT  --   --  6.3*  --   ALBUMIN  --   --  3.2*  --   AST  --   --  24  --   ALT  --   --  50  --   ALKPHOS  --   --  79  --   BILITOT  --   --  0.8  --   GFRNONAA >60 45* >60 >60  GFRAA >60 52* >60 >60  ANIONGAP 10  --  9 11     Hematology Recent Labs Lab 03/07/2017 2248 03/01/17 0343  03/02/17 0337  WBC 10.0 8.7 10.6*  RBC 3.73* 3.57* 3.58*  HGB 11.2* 10.8* 10.7*  HCT 35.6* 34.3* 33.9*  MCV 95.4 96.1 94.7  MCH 30.0 30.3 29.9  MCHC 31.5 31.5 31.6  RDW 14.9 15.0 14.9  PLT 351 365 379    Cardiac Enzymes Recent Labs Lab 03/09/2017 2248 03/01/17 0343 03/01/17 1613  TROPONINI 0.05* 0.05* 0.05*    Recent Labs Lab 03/14/2017 1040  TROPIPOC 0.02     BNP Recent Labs Lab 03/07/2017 1028  BNP 367.1*     DDimer No results for input(s): DDIMER in the last 168 hours.   Radiology    No results found.  Cardiac Studies   2D Echo 01/15/17 Study Conclusions - Left ventricle: The cavity size was moderately dilated. Wall thickness was increased in a pattern of mild LVH. Systolic function was mildly to moderately reduced. The estimated ejection fraction was in the range of 40% to 45%. Diffuse hypokinesis. Doppler parameters are consistent with abnormal left ventricular relaxation (grade 1 diastolic dysfunction). Doppler parameters are consistent with high ventricular filling pressure. - Aortic valve: There was trivial regurgitation. - Left atrium: The atrium was mildly dilated. - Right ventricle: The cavity size was mildly dilated. - Right atrium: The atrium was mildly dilated. Impressions: - Mild to moderate global reduction in LV systolic function; mild diastolic dysfunction with elevated  LV fillin pressure; moderate LVE; mild LVH; mild biatrial enlargement; mild RVE.  Patient Profile     66 y.o. 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 He saw Dr.Croitoru in the office 01/04/17 and was noted to be in atrial flutter vs MAT. Dr Sallyanne Kuster recommend starting anticoagulation and setting up TEE guided cardioversion. The pt was reluctant to start anticoagulation secondary to his severe anemia requiring transfusion in 2017. Dr Sallyanne Kuster felt that in the long run he'dprobably do best with radiofrequency ablation, because of hisrisk for recurrent bleeding secondary to Crohn's. Recently readmitted 10/24 with atrial flutter RVR and a/c combined CHF. Given impending knee surgery, decision was made to delay anticoagulation until felt safe - discharged in atrial flutter HR 90s-100s. See in office 11/1 with marked dyspnea, 18lb weight gain, tachycardic despite outpatient doubling of Lasix/added metolazone.  Office weight 05/2016 248lb and feeling well; discharged at 245lb on 10/25.  Assessment & Plan    1. Persistent atrial flutter - continue 10mg /hr of IV diltiazem . HR still up in the 120's with ambulation. - increase Lopressor to 25mg  BID -Therapies for his atrial flutter have been limited due to the complex anticoagulation question - patient has previously been reluctant to pursue anticoagulation given h/o prior significant anemia/Crohn's disease, and was supposed to be pending upcoming knee surgery. -There have been prior discussions about ablation with short-term anticoagulation thereafter. Left atrium is mildly dilated but his obesity would make risk of recurrent arrhythmias more likely.  - will ask EP to consult for input on onging treatment of atrial arrhythmias with possible flutter ablation after TEE with short course of anticoagulation  2.  Acute on chronic combined CHF  - He had excellent diuresis overnight and he put out 3.9 L and is net -5.2 L -Weight has decreased 12 pounds since admission (627>035>009) and is now below prior office weight of 248 pounds in February -Creatinine remains stable at 1.16 -He still appears volume overloaded on exam with crackles at  the bases so will continue IV Lasix for now.  -I suspect that his A flutter is contributing to his SOB  3. CAD  - mildly elevated troponin elevation, suspected to be demand ischemia. No recent CP.  - Could consider updating ischemic eval once more compensated to exclude contributing factor driving CHF and atrial arrhythmia. However, even at present time he is not even on aspirin.  - Continue statin (has had myalgias with higher dose), last LDL 32.  4. Anemia - known for patient. Per outside records, prior Hgb down to 9.7 in 01/2016, then 13 in 10/2016, more recently in 11 range. Follow.  For questions or updates, please contact [place] Please consult [email or web address] for contact info under Cardiology/STEMI.      Signed, Fransico Him, MD  03/02/2017, 12:03 PM

## 2017-03-02 NOTE — Progress Notes (Signed)
Pt refused to wear CPAP.  RT will continue to monitor.  

## 2017-03-02 NOTE — Progress Notes (Signed)
Pt stated she did not want to wear her CPAP tonight. RT will continue to monitor.

## 2017-03-03 ENCOUNTER — Inpatient Hospital Stay (HOSPITAL_COMMUNITY): Payer: Medicare HMO

## 2017-03-03 ENCOUNTER — Other Ambulatory Visit: Payer: Self-pay

## 2017-03-03 LAB — BASIC METABOLIC PANEL
Anion gap: 12 (ref 5–15)
BUN: 21 mg/dL — AB (ref 6–20)
CALCIUM: 8.8 mg/dL — AB (ref 8.9–10.3)
CO2: 32 mmol/L (ref 22–32)
Chloride: 89 mmol/L — ABNORMAL LOW (ref 101–111)
Creatinine, Ser: 1.13 mg/dL (ref 0.61–1.24)
GFR calc Af Amer: >60 mL/min (ref >60)
GLUCOSE: 119 mg/dL — AB (ref 65–99)
Potassium: 3.5 mmol/L (ref 3.5–5.1)
Sodium: 133 mmol/L — ABNORMAL LOW (ref 135–145)

## 2017-03-03 LAB — CBC
HEMATOCRIT: 33.4 % — AB (ref 39.0–52.0)
Hemoglobin: 10.4 g/dL — ABNORMAL LOW (ref 13.0–17.0)
MCH: 29.3 pg (ref 26.0–34.0)
MCHC: 31.1 g/dL (ref 30.0–36.0)
MCV: 94.1 fL (ref 78.0–100.0)
Platelets: 366 10*3/uL (ref 150–400)
RBC: 3.55 MIL/uL — ABNORMAL LOW (ref 4.22–5.81)
RDW: 14.8 % (ref 11.5–15.5)
WBC: 10.1 10*3/uL (ref 4.0–10.5)

## 2017-03-03 MED ORDER — OXYCODONE HCL 5 MG PO TABS
5.0000 mg | ORAL_TABLET | Freq: Once | ORAL | Status: AC
  Administered 2017-03-03: 5 mg via ORAL
  Filled 2017-03-03: qty 1

## 2017-03-03 MED ORDER — DILTIAZEM HCL 60 MG PO TABS
60.0000 mg | ORAL_TABLET | Freq: Four times a day (QID) | ORAL | Status: DC
  Administered 2017-03-03 – 2017-03-06 (×12): 60 mg via ORAL
  Filled 2017-03-03 (×12): qty 1

## 2017-03-03 MED ORDER — WHITE PETROLATUM EX OINT
TOPICAL_OINTMENT | CUTANEOUS | Status: AC
  Administered 2017-03-03: 04:00:00
  Filled 2017-03-03: qty 28.35

## 2017-03-03 MED ORDER — MAGNESIUM GLUCONATE 500 MG PO TABS
500.0000 mg | ORAL_TABLET | Freq: Two times a day (BID) | ORAL | Status: AC
  Administered 2017-03-03 (×2): 500 mg via ORAL
  Filled 2017-03-03 (×2): qty 1

## 2017-03-03 MED ORDER — FUROSEMIDE 40 MG PO TABS
40.0000 mg | ORAL_TABLET | Freq: Two times a day (BID) | ORAL | Status: DC
  Administered 2017-03-03 – 2017-03-05 (×4): 40 mg via ORAL
  Filled 2017-03-03 (×4): qty 1

## 2017-03-03 NOTE — Progress Notes (Signed)
Family Medicine Teaching Service Daily Progress Note Intern Pager: [phone]  Patient name: Jay [name] Medical record number: [id number] Date of birth: Oct 17, 1950 Age: 66 y.o. Gender: male  Primary Care Provider: Cathlean Sauer, MD Consultants: cards Code Status: DNR  Pt Overview and Major Events to Date:  Jay [name] is a 66 y.o. male presenting with acute hypoxic respiratory failure. PMH is significant for psoriatic arthritis, COPD, Crohn's disease, atrial fibrillation (no anticoagulation), status post CABG x4, OSA on CPAP, chronic combined congestive heart failure   Assessment and Plan: Jay [name] is a 66 y.o. male presenting with acute hypoxic respiratory failure. PMH is significant for psoriatic arthritis, COPD, Crohn's disease, atrial fibrillation (no anticoagulation), status post CABG x4, OSA on CPAP, chronic combined congestive heart failure   Combined CHF exacerbation with acute hypoxic respiratory failure: EF 40-45% with G1DD (9/18). Dry weight=234 lbs. Exacerbation is most likely secondary to the persistent tachycardia for the past month. Minimally elevated trop (0.05) thought to be secondary to demand ischemia. Requiring 3-4L O2 by [place].  Weight is down 11lbs (254>243lbs) and UOP 6.3L.  -telemetry  -Lasix 80 mg IV BID  -Continuous pulse ox and supplemental O2 prn  -Cardiology consulted; appreciate recs  -home Toprol XL 25 mg daily changed to succinate 12.5bid -Strict I's and O's -Daily weights  AFib: Currently rate controlled on diltiazem drip. Per chart review, patient has been having recurrent A. fib with RVR and is not receiving any anticoagulation due to his Crohn's disease.  -home Toprol XL 25 mg daily changed to succinate 12.5bid -continue diltiazem drip (concern for dilt in HFrEF discussed with cardiology, they are considering transition to b-blocker) -Cardiology consulted -EP consulted to discuss potential ablation  COPD: 2L by [place] at night and prn  at home. No evidence of exacerbation. Former smoker. Patient refuses cpap -Continuous pulse ox -change Dulera 2 puffs BID to flovent 220 2 puffs BID for concern of HR -home Spiriva daily -Titrate oxygen to >90% O2  Foot pain- non-crampy, seem vascularly/neurologically intact with no lesions noted -1x roxy 5mg   hypomagnesiemia- 1.5 11/3 -500mg  BID x2 doses  Hyponatremia- 133 patient has not been on IVF and is diuresed 6.3L since admission -will recheck in AM and consider salt tabs vs renal workup as IVF would interfere with treatment plan  Crohn's disease Stable  CAD with history of CABG x4 No chest pain. EKG showing atrial flutter but no signs of ischemia.   OSA -CPAP at night  Hx of prostate cancer w/ prostectomy-  -home fesoterodine for incontinence  FEN/GI: low sodium diet/protonix Prophylaxis: subcutaneous hep  Disposition: Pending adequate diuresis and sustained rate control for Afib  Subjective:  Discussed complexity of patient's case and physicians having concern with benefits/risks of diltiazem/ablation/anti-coagulation.   Patient states he is tired of fighting and he's ok with it if he dies.   He firmly denies any thoughts of hurting himself or others and just feels "like there is no beating this".  We discussed a hope that maybe EP will have some optimism.  He also described a new bilateral foot pain.  Says it is not crampy and feels like someone has been beating his feet with a hammer.   Feet have ~2 cap refill, no lesions/cellulitis noted, sensation motor control intact.  Objective: Temp:  [97.7 F (36.5 C)-99.3 F (37.4 C)] 98.9 F (37.2 C) (11/04 0001) Pulse Rate:  [43-104] 82 (11/04 0001) Resp:  [18-29] 27 (11/04 0001) BP: (97-132)/(47-75) 97/66 (11/04 0001) SpO2:  [  90 %-97 %] 95 % (11/04 0001) FiO2 (%):  [36 %] 36 % (11/03 0857) Weight:  [246 lb 9.6 oz (111.9 kg)] 246 lb 9.6 oz (111.9 kg) (11/03 0500) Physical Exam: General: 66 year old man laying  on side of bed in NAD, uncomfortable Cardiovascular: Tachycardic and irregularly irregular, no apparent murmurs Respiratory: increased WOB, crackles in the bilateral bases with diminished breath sounds Extremities: 1+ pitting edema to the knees bilaterally Neuro: Alert and oriented x3, no focal neurological deficits Psych: Pleasant, seems somewhat hopeless about health recovery but denies SI/HI or chaplain offer  Laboratory:  Recent Labs Lab 03/20/2017 2248 03/01/17 0343 03/02/17 0337  WBC 10.0 8.7 10.6*  HGB 11.2* 10.8* 10.7*  HCT 35.6* 34.3* 33.9*  PLT 351 365 379    Recent Labs Lab 03/04/2017 1028 03/17/2017 2248 03/01/17 0343 03/02/17 0337  NA 139  --  138 136  K 3.9  --  3.8 3.6  CL 99*  --  95* 91*  CO2 30  --  34* 34*  BUN 13  --  15 15  CREATININE 1.04 1.56* 1.12 1.16  CALCIUM 8.7*  --  9.2 8.9  PROT  --   --  6.3*  --   BILITOT  --   --  0.8  --   ALKPHOS  --   --  79  --   ALT  --   --  50  --   AST  --   --  24  --   GLUCOSE 122*  --  138* 125*      Imaging/Diagnostic Tests: Dg Chest 2 View  Result Date: 03/27/2017 CLINICAL DATA:  Shortness of breath, CHF, worse over the last month EXAM: CHEST  2 VIEW COMPARISON:  Chest x-ray of 02/20/2017 FINDINGS: There is cardiomegaly present with some fullness of the perihilar vasculature possibly indicating mild pulmonary vascular congestion, without definite congestive heart failure. Tiny pleural effusions cannot be excluded with some blunting of the posterior costophrenic angles. No pneumonia is seen. Median sternotomy sutures are present from prior CABG. No acute bony abnormality is seen. IMPRESSION: Cardiomegaly.  Probable mild pulmonary vascular congestion. Electronically Signed   By: Ivar Drape M.D.   On: 03/01/2017 11:07     Sherene Sires, DO 03/03/2017, 1:11 AM PGY-3, [place] Intern pager: [phone], text pages welcome

## 2017-03-03 NOTE — Progress Notes (Addendum)
Progress Note  Patient Name: Jay [name] Date of Encounter: 03/03/2017  Primary Cardiologist: Dr. Sallyanne Kuster  Subjective   Still complains of shortness of breath and very frustrated.  HR control much improved  Inpatient Medications    Scheduled Meds: . allopurinol  300 mg Oral Daily  . atorvastatin  20 mg Oral Daily  . budesonide (PULMICORT) nebulizer solution  0.5 mg Nebulization BID  . fesoterodine  4 mg Oral Daily  . furosemide  80 mg Intravenous BID  . heparin  5,000 Units Subcutaneous Q8H  . Living Better with Heart Failure Book   Does not apply Once  . loratadine  10 mg Oral Daily  . magnesium gluconate  500 mg Oral BID  . mouth rinse  15 mL Mouth Rinse BID  . metoprolol tartrate  12.5 mg Oral Once  . metoprolol tartrate  25 mg Oral BID  . pantoprazole  40 mg Oral Daily  . potassium chloride  20 mEq Oral BID  . tiotropium  18 mcg Inhalation Daily   Continuous Infusions: . diltiazem (CARDIZEM) infusion 10 mg/hr (03/03/17 1027)   PRN Meds: acetaminophen   Vital Signs    Vitals:   03/03/17 0751 03/03/17 0753 03/03/17 0800 03/03/17 1021  BP:   99/62 130/80  Pulse:   72 92  Resp:   (!) 31   Temp:      TempSrc:      SpO2: 92% 93% 93%   Weight:      Height:        Intake/Output Summary (Last 24 hours) at 03/03/2017 1142 Last data filed at 03/03/2017 0800 Gross per 24 hour  Intake 812 ml  Output 1800 ml  Net -988 ml   Filed Weights   03/01/17 1409 03/02/17 0500 03/03/17 0422  Weight: 254 lb 13.6 oz (115.6 kg) 246 lb 9.6 oz (111.9 kg) 243 lb 6.2 oz (110.4 kg)    Telemetry    Atrial flutter with RVR- Personally Reviewed  ECG    No new EKG to review- Personally Reviewed  Physical Exam   GEN: No acute distress.   Neck: No JVD Cardiac: regular and tachy, no murmurs, rubs, or gallops.  Respiratory: Clear to auscultation bilaterally. GI: Soft, nontender, non-distended  MS: No edema; No deformity. Neuro:  Nonfocal  Psych: Normal affect    Labs    Chemistry Recent Labs  Lab 03/01/17 0343 03/02/17 0337 03/03/17 0225  NA 138 136 133*  K 3.8 3.6 3.5  CL 95* 91* 89*  CO2 34* 34* 32  GLUCOSE 138* 125* 119*  BUN 15 15 21*  CREATININE 1.12 1.16 1.13  CALCIUM 9.2 8.9 8.8*  PROT 6.3*  --   --   ALBUMIN 3.2*  --   --   AST 24  --   --   ALT 50  --   --   ALKPHOS 79  --   --   BILITOT 0.8  --   --   GFRNONAA >60 >60 >60  GFRAA >60 >60 >60  ANIONGAP 9 11 12      Hematology Recent Labs  Lab 03/01/17 0343 03/02/17 0337 03/03/17 0225  WBC 8.7 10.6* 10.1  RBC 3.57* 3.58* 3.55*  HGB 10.8* 10.7* 10.4*  HCT 34.3* 33.9* 33.4*  MCV 96.1 94.7 94.1  MCH 30.3 29.9 29.3  MCHC 31.5 31.6 31.1  RDW 15.0 14.9 14.8  PLT 365 379 366    Cardiac Enzymes Recent Labs  Lab 03/26/2017 2248 03/01/17 0343 03/01/17 1613  TROPONINI 0.05* 0.05* 0.05*    Recent Labs  Lab 03/29/2017 1040  TROPIPOC 0.02     BNP Recent Labs  Lab 03/24/2017 1028  BNP 367.1*     DDimer No results for input(s): DDIMER in the last 168 hours.   Radiology    No results found.  Cardiac Studies   2D Echo 01/15/17 Study Conclusions - Left ventricle: The cavity size was moderately dilated. Wall thickness was increased in a pattern of mild LVH. Systolic function was mildly to moderately reduced. The estimated ejection fraction was in the range of 40% to 45%. Diffuse hypokinesis. Doppler parameters are consistent with abnormal left ventricular relaxation (grade 1 diastolic dysfunction). Doppler parameters are consistent with high ventricular filling pressure. - Aortic valve: There was trivial regurgitation. - Left atrium: The atrium was mildly dilated. - Right ventricle: The cavity size was mildly dilated. - Right atrium: The atrium was mildly dilated. Impressions: - Mild to moderate global reduction in LV systolic function; mild diastolic dysfunction with elevated LV fillin pressure; moderate LVE; mild LVH; mild biatrial  enlargement; mild RVE.    Patient Profile     66 y.o. 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 He saw Dr.Croitoru in the office 01/04/17 and was noted to be in atrial flutter vs MAT. Dr Sallyanne Kuster recommend starting anticoagulation and setting up TEE guided cardioversion. The pt was reluctant to start anticoagulation secondary to his severe anemia requiring transfusion in 2017. Dr Sallyanne Kuster felt that in the long run he'dprobably do best with radiofrequency ablation, because of hisrisk for recurrent bleeding secondary to Crohn's. Recently readmitted 10/24 with atrial flutter RVR and a/c combined CHF. Given impending knee surgery, decision was made to delay anticoagulation until felt safe - discharged in atrial flutter HR 90s-100s. See in office 11/1 with marked dyspnea, 18lb weight gain, tachycardic despite outpatient doubling of Lasix/added metolazone.  Office weight 05/2016 248lb and feeling well; discharged at 245lb on 10/25.     Assessment & Plan    1. Persistent atrial flutter - Heart rate much improved - continue Lopressor to 25mg  BID -We will change IV Cardizem to p.o. Cardizem 60 mg every 6 hours -Therapies for his atrial flutter have been limited due to the complex anticoagulation question - patient has previously been reluctant to pursue anticoagulation given h/o prior significant anemia/Crohn's disease, and was supposed to be pending upcoming knee surgery. -There have been prior discussions about ablation with short-term anticoagulation thereafter. Left atrium is mildly dilated but his obesity would make risk of recurrent arrhythmias more likely.  - will ask EP to consult for input on onging treatment of atrial arrhythmias with possible flutter ablation after TEE with short course of anticoagulation  2. Acute on chronic combined CHF  -  He had excellent diuresis overnight and he put out 2.2 L and is net -6.2L - Weight has decreased 15 pounds since admission [phone]) and is now below prior office weight of 248 pounds in February -Creatinine stable at 1.3 -He has abnormal lung sounds at the right base and has not had a chest x-ray several days so I will repeat this today -I suspect that his A flutter is contributing to his SOB - he is 19lbs down with no LE edema and lungs clear except right base that does not sound like crackles.  - change Lasix to 40mg  BID  PO  3. CAD  - mildly elevated troponin elevation, suspected to be demand ischemia. No recent CP.  - Could consider updating ischemic eval once more compensated to exclude contributing factor driving CHF and atrial arrhythmia. However, even at present time he is not even on aspirin.  - Continue statin (has had myalgias with higher dose), last LDL 32.  4. Anemia - known for patient. Per outside records, prior Hgb down to 9.7 in 01/2016, then 13 in 10/2016, more recently in 11 range. Follow.  5.  Hypokalemia - replete per [facility]   For questions or updates, please contact [place] Please consult [email or web address] for contact info under Cardiology/STEMI.      Signed, Fransico Him, MD  03/03/2017, 11:42 AM

## 2017-03-03 NOTE — Progress Notes (Signed)
Pt refusing CPAP at this time. Pt states he has one at home but will not wear one here

## 2017-03-04 DIAGNOSIS — R0602 Shortness of breath: Secondary | ICD-10-CM

## 2017-03-04 DIAGNOSIS — I4892 Unspecified atrial flutter: Secondary | ICD-10-CM

## 2017-03-04 LAB — OCCULT BLOOD X 1 CARD TO LAB, STOOL: FECAL OCCULT BLD: NEGATIVE

## 2017-03-04 LAB — CBC
HCT: 30.7 % — ABNORMAL LOW (ref 39.0–52.0)
HEMOGLOBIN: 9.7 g/dL — AB (ref 13.0–17.0)
MCH: 29.6 pg (ref 26.0–34.0)
MCHC: 31.6 g/dL (ref 30.0–36.0)
MCV: 93.6 fL (ref 78.0–100.0)
PLATELETS: 367 10*3/uL (ref 150–400)
RBC: 3.28 MIL/uL — AB (ref 4.22–5.81)
RDW: 14.9 % (ref 11.5–15.5)
WBC: 9.7 10*3/uL (ref 4.0–10.5)

## 2017-03-04 LAB — BASIC METABOLIC PANEL
Anion gap: 11 (ref 5–15)
BUN: 25 mg/dL — ABNORMAL HIGH (ref 6–20)
CO2: 34 mmol/L — AB (ref 22–32)
CREATININE: 1.26 mg/dL — AB (ref 0.61–1.24)
Calcium: 8.8 mg/dL — ABNORMAL LOW (ref 8.9–10.3)
Chloride: 87 mmol/L — ABNORMAL LOW (ref 101–111)
GFR calc non Af Amer: 59 mL/min — ABNORMAL LOW (ref >60)
Glucose, Bld: 119 mg/dL — ABNORMAL HIGH (ref 65–99)
Potassium: 3.8 mmol/L (ref 3.5–5.1)
Sodium: 132 mmol/L — ABNORMAL LOW (ref 135–145)

## 2017-03-04 NOTE — Progress Notes (Signed)
Progress Note  Patient Name: Jay [name] Date of Encounter: 03/04/2017  Primary Cardiologist: Dr. Lorenza Cambridge  Subjective   Shortness of breath present, no chest pain.  Inpatient Medications    Scheduled Meds: . allopurinol  300 mg Oral Daily  . atorvastatin  20 mg Oral Daily  . budesonide (PULMICORT) nebulizer solution  0.5 mg Nebulization BID  . diltiazem  60 mg Oral Q6H  . fesoterodine  4 mg Oral Daily  . furosemide  40 mg Oral BID  . heparin  5,000 Units Subcutaneous Q8H  . Living Better with Heart Failure Book   Does not apply Once  . loratadine  10 mg Oral Daily  . mouth rinse  15 mL Mouth Rinse BID  . metoprolol tartrate  12.5 mg Oral Once  . metoprolol tartrate  25 mg Oral BID  . pantoprazole  40 mg Oral Daily  . potassium chloride  20 mEq Oral BID  . tiotropium  18 mcg Inhalation Daily   Continuous Infusions:  PRN Meds: acetaminophen   Vital Signs    Vitals:   03/04/17 0429 03/04/17 0520 03/04/17 0834 03/04/17 0837  BP:  114/70 102/88   Pulse:   83   Resp:   16   Temp:   97.7 F (36.5 C)   TempSrc:   Oral   SpO2:   94% 94%  Weight: 245 lb 6 oz (111.3 kg)     Height:        Intake/Output Summary (Last 24 hours) at 03/04/2017 0938 Last data filed at 03/04/2017 0901 Gross per 24 hour  Intake 895.33 ml  Output 1691 ml  Net -795.67 ml   Filed Weights   03/02/17 0500 03/03/17 0422 03/04/17 0429  Weight: 246 lb 9.6 oz (111.9 kg) 243 lb 6.2 oz (110.4 kg) 245 lb 6 oz (111.3 kg)    Telemetry    Atrial flutter controlled heart rate-personally Reviewed  ECG    Atrial flutter with rapid ventricular response- Personally Reviewed  Physical Exam   GEN: No acute distress.  Overweight, mildly increased respiratory effort Neck: No JVD Cardiac:  RRR, subtle irregularity, no murmurs, rubs, or gallops.  Respiratory:  Poor air movement, mild wheeze bilaterally. GI: Soft, nontender, non-distended  MS: No edema; No deformity. Neuro:  Nonfocal  Psych:  Normal affect   Labs    Chemistry Recent Labs  Lab 03/01/17 0343 03/02/17 0337 03/03/17 0225 03/04/17 0304  NA 138 136 133* 132*  K 3.8 3.6 3.5 3.8  CL 95* 91* 89* 87*  CO2 34* 34* 32 34*  GLUCOSE 138* 125* 119* 119*  BUN 15 15 21* 25*  CREATININE 1.12 1.16 1.13 1.26*  CALCIUM 9.2 8.9 8.8* 8.8*  PROT 6.3*  --   --   --   ALBUMIN 3.2*  --   --   --   AST 24  --   --   --   ALT 50  --   --   --   ALKPHOS 79  --   --   --   BILITOT 0.8  --   --   --   GFRNONAA >60 >60 >60 59*  GFRAA >60 >60 >60 >60  ANIONGAP 9 11 12 11      Hematology Recent Labs  Lab 03/02/17 0337 03/03/17 0225 03/04/17 0304  WBC 10.6* 10.1 9.7  RBC 3.58* 3.55* 3.28*  HGB 10.7* 10.4* 9.7*  HCT 33.9* 33.4* 30.7*  MCV 94.7 94.1 93.6  MCH 29.9 29.3 29.6  MCHC 31.6 31.1 31.6  RDW 14.9 14.8 14.9  PLT 379 366 367    Cardiac Enzymes Recent Labs  Lab 03/29/2017 2248 03/01/17 0343 03/01/17 1613  TROPONINI 0.05* 0.05* 0.05*    Recent Labs  Lab 03/02/2017 1040  TROPIPOC 0.02     BNP Recent Labs  Lab 03/26/2017 1028  BNP 367.1*     DDimer No results for input(s): DDIMER in the last 168 hours.   Radiology    Dg Chest 2 View  Result Date: 03/03/2017 CLINICAL DATA:  66 year old male with shortness of Breath for 4 days. EXAM: CHEST  2 VIEW COMPARISON:  03/20/2017 and earlier. FINDINGS: AP and lateral views of the chest. Stable cardiomegaly and mediastinal contours. Prior CABG. Stable lung volumes. No pneumothorax or consolidation. Small bilateral pleural effusions have regressed. Decreased pulmonary vascularity. No pulmonary edema. No acute osseous abnormality identified. Negative visible bowel gas pattern. IMPRESSION: 1. Regressed small bilateral pleural effusions and pulmonary vascular congestion since 03/16/2017. 2. Stable cardiomegaly.  No new cardiopulmonary abnormality. Electronically Signed   By: Genevie Ann M.D.   On: 03/03/2017 14:12    Cardiac Studies   Echocardiogram 01/15/17: -EF 40-45%,  mildly dilated left atrium.  Patient Profile     66 y.o. 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 He saw Dr.Croitoru in the office 01/04/17 and was noted to be in atrial flutter vs MAT. Dr Sallyanne Kuster recommend starting anticoagulation and setting up TEE guided cardioversion. The pt was reluctant to start anticoagulation secondary to his severe anemia requiring transfusion in 2017. Dr Sallyanne Kuster felt that in the long run he'dprobably do best with radiofrequency ablation, because of hisrisk for recurrent bleeding secondary to Crohn's.Recently readmitted 10/24 with atrial flutter RVR and a/c combined CHF. Given impending knee surgery, decision was made to delay anticoagulation until felt safe - discharged in atrial flutter HR 90s-100s. See in office 11/1 with marked dyspnea, 18lb weight gain, tachycardic despite outpatient doubling of Lasix/added metolazone. Office weight 05/2016 248lb and feeling well; discharged at 245lb on 10/25.    Assessment & Plan    Atrial flutter -Currently on a combination of Cardizem and Lopressor.  Improved heart rate.  Per review of records therapies for atrial flutter have been limited due to complex antic regulation situation, patient reluctant because of prior anemia, Crohn's disease and pending upcoming knee surgery.  EP has been consulted to discuss possible flutter ablation.  Acute on chronic systolic/diastolic heart failure -Admit weight was 258, current weight 245.  Improved.  Lasix 40 p.o. twice daily  Coronary artery disease -Minimally elevated troponin likely demand ischemia in the setting of tachycardia.  No chest pain.  Consider evaluation in the future.  Anemia -Hemoglobin in October 2017 was 9.7.  Most recent of 42.  COPD -2 L at night and at home.  Clearly he has trouble moving air on exam.  He has wheezes  bilaterally.  This is playing a large role in his dyspnea.  Morbid obesity-2 comorbidities with BMI greater than 35.  Continue to encourage weight loss. Body mass index is 36.24 kg/m.  For questions or updates, please contact [place] Please consult [email or web address] for contact info under Cardiology/STEMI.      Signed, Candee Furbish, MD  03/04/2017, 9:38 AM

## 2017-03-04 NOTE — Plan of Care (Signed)
  Activity: Ability to tolerate increased activity will improve 03/04/2017 0038 - Progressing by Blair Promise, RN

## 2017-03-04 NOTE — Care Management Important Message (Signed)
Important Message  Patient Details  Name: Jay [name] MRN: [id number] Date of Birth: Nov 28, 1950   Medicare Important Message Given:  Yes    Nathen May 03/04/2017, 10:34 AM

## 2017-03-04 NOTE — Progress Notes (Signed)
Family Medicine Teaching Service Daily Progress Note Intern Pager: [phone]  Patient name: Jay [name] Medical record number: [id number] Date of birth: December 20, 1950 Age: 66 y.o. Gender: male  Primary Care Provider: Cathlean Sauer, MD Consultants: cards Code Status: DNR  Pt Overview and Major Events to Date:  Jay [name] is a 66 y.o. male presenting with acute hypoxic respiratory failure. PMH is significant for psoriatic arthritis, COPD, Crohn's disease, atrial fibrillation (no anticoagulation), status post CABG x4, OSA on CPAP, chronic combined congestive heart failure   Assessment and Plan: Jay [name] is a 66 y.o. male presenting with acute hypoxic respiratory failure. PMH is significant for psoriatic arthritis, COPD, Crohn's disease, atrial fibrillation (no anticoagulation), status post CABG x4, OSA on CPAP, chronic combined congestive heart failure   Combined CHF exacerbation with acute hypoxic respiratory failure: EF 40-45% with G1DD (9/18). Dry weight=234 lbs. Exacerbation is most likely secondary to the persistent tachycardia for the past month. Minimally elevated trop (0.05) thought to be secondary to demand ischemia. Requiring 3-4L O2 by [place].  Weight is down 9lbs (254>245 lbs) and UOP 7.2L.  -telemetry  -Lasix 80 mg IV BID  -Continuous pulse ox and supplemental O2 prn  -Cardiology consulted; appreciate recs  -home Toprol XL 25 mg daily changed to succinate 12.5bid -Strict I's and O's -Daily weights  AFib: Currently rate controlled transition today to PO dilt instead of drip. Per chart review, patient has been having recurrent A. fib with RVR and is not receiving any anticoagulation due to his Crohn's disease.  -home Toprol XL 25 mg daily changed to succinate 12.5bid -dilt 60mg  PO q6, d/cing dilt drip -Cardiology consulted -EP consulted to discuss potential ablation  COPD: 2L by [place] at night and prn at home. No evidence of exacerbation. Former smoker. Patient  refuses cpap -Continuous pulse ox -change Dulera 2 puffs BID to flovent 220 2 puffs BID for concern of HR -home Spiriva daily -Titrate oxygen to >90% O2  Anemia-Hgb 9.7 down from baseline ~10.5-11.5.  Hx of bleeding and not getting IVF currently so dilution not likely -will discuss with team and evaluate for possible causes as no known bleed currently -FOBT ordered  Hyponatremia- 132 patient has not been on IVF and is diuresed 7.2L since admission -will discuss either fluid restriction or salt tabs given contraindication of NS IVF during dirueses for CHF  Crohn's disease Stable  CAD with history of CABG x4 No chest pain. EKG showing atrial flutter but no signs of ischemia.   OSA -CPAP at night  Hx of prostate cancer w/ prostectomy-  -home fesoterodine for incontinence  FEN/GI: low sodium diet/protonix Prophylaxis: subcutaneous hep  Disposition: Pending adequate diuresis and sustained rate control for Afib  Subjective:  Is comfortable with waiting for EP consult this AM.  Does not think his breathing is at baseline yet and is concerned it might never get to baseline.  He says edema has improved and foot pain (which he says historically is tied to overvolume) has largely gone away at this point  Objective: Temp:  [97.9 F (36.6 C)-99 F (37.2 C)] 98.5 F (36.9 C) (11/04 2000) Pulse Rate:  [51-92] 88 (11/04 2000) Resp:  [22-31] 23 (11/04 2000) BP: (99-130)/(62-80) 128/76 (11/04 2000) SpO2:  [92 %-96 %] 96 % (11/04 2000) Weight:  [243 lb 6.2 oz (110.4 kg)] 243 lb 6.2 oz (110.4 kg) (11/04 0422) Physical Exam: General: 66 year old man laying in bed in NAD, uncomfortable Cardiovascular: Tachycardic and irregularly irregular, no apparent murmurs Respiratory:  increased WOB, crackles in the bilateral bases with diminished breath sounds Extremities: 1+ pitting edema to the knees bilaterally Neuro: Alert and oriented x3, no gross neurological deficits Psych: Pleasant, seems  somewhat hopeless about health recovery but denies SI/HI or chaplain offer  Laboratory: Recent Labs  Lab 03/01/17 0343 03/02/17 0337 03/03/17 0225  WBC 8.7 10.6* 10.1  HGB 10.8* 10.7* 10.4*  HCT 34.3* 33.9* 33.4*  PLT 365 379 366   Recent Labs  Lab 03/01/17 0343 03/02/17 0337 03/03/17 0225  NA 138 136 133*  K 3.8 3.6 3.5  CL 95* 91* 89*  CO2 34* 34* 32  BUN 15 15 21*  CREATININE 1.12 1.16 1.13  CALCIUM 9.2 8.9 8.8*  PROT 6.3*  --   --   BILITOT 0.8  --   --   ALKPHOS 79  --   --   ALT 50  --   --   AST 24  --   --   GLUCOSE 138* 125* 119*   Imaging/Diagnostic Tests: Dg Chest 2 View  Result Date: 03/02/2017 CLINICAL DATA:  Shortness of breath, CHF, worse over the last month EXAM: CHEST  2 VIEW COMPARISON:  Chest x-ray of 02/20/2017 FINDINGS: There is cardiomegaly present with some fullness of the perihilar vasculature possibly indicating mild pulmonary vascular congestion, without definite congestive heart failure. Tiny pleural effusions cannot be excluded with some blunting of the posterior costophrenic angles. No pneumonia is seen. Median sternotomy sutures are present from prior CABG. No acute bony abnormality is seen. IMPRESSION: Cardiomegaly.  Probable mild pulmonary vascular congestion. Electronically Signed   By: Ivar Drape M.D.   On: 03/29/2017 11:07     Sherene Sires, DO 03/04/2017, 12:37 AM PGY-3, [place] Intern pager: [phone], text pages welcome

## 2017-03-05 LAB — LIPID PANEL
CHOL/HDL RATIO: 2.2 ratio
Cholesterol: 96 mg/dL (ref 0–200)
HDL: 44 mg/dL (ref >40)
LDL Cholesterol: 36 mg/dL (ref 0–99)
Triglycerides: 80 mg/dL (ref <150)
VLDL: 16 mg/dL (ref 0–40)

## 2017-03-05 LAB — CBC
HEMATOCRIT: 31.5 % — AB (ref 39.0–52.0)
HEMOGLOBIN: 9.9 g/dL — AB (ref 13.0–17.0)
MCH: 29 pg (ref 26.0–34.0)
MCHC: 31.4 g/dL (ref 30.0–36.0)
MCV: 92.4 fL (ref 78.0–100.0)
Platelets: 399 10*3/uL (ref 150–400)
RBC: 3.41 MIL/uL — ABNORMAL LOW (ref 4.22–5.81)
RDW: 14.7 % (ref 11.5–15.5)
WBC: 10.4 10*3/uL (ref 4.0–10.5)

## 2017-03-05 LAB — BASIC METABOLIC PANEL
Anion gap: 9 (ref 5–15)
BUN: 31 mg/dL — AB (ref 6–20)
CALCIUM: 9 mg/dL (ref 8.9–10.3)
CHLORIDE: 89 mmol/L — AB (ref 101–111)
CO2: 36 mmol/L — AB (ref 22–32)
CREATININE: 1.27 mg/dL — AB (ref 0.61–1.24)
GFR calc non Af Amer: 58 mL/min — ABNORMAL LOW (ref >60)
Glucose, Bld: 133 mg/dL — ABNORMAL HIGH (ref 65–99)
Potassium: 4 mmol/L (ref 3.5–5.1)
Sodium: 134 mmol/L — ABNORMAL LOW (ref 135–145)

## 2017-03-05 LAB — IRON AND TIBC
IRON: 25 ug/dL — AB (ref 45–182)
Saturation Ratios: 8 % — ABNORMAL LOW (ref 17.9–39.5)
TIBC: 308 ug/dL (ref 250–450)
UIBC: 283 ug/dL

## 2017-03-05 LAB — FERRITIN: Ferritin: 1051 ng/mL — ABNORMAL HIGH (ref 24–336)

## 2017-03-05 MED ORDER — TORSEMIDE 20 MG PO TABS: 20.0000 mg | ORAL_TABLET | Freq: Every day | ORAL | Status: DC

## 2017-03-05 MED ORDER — LEVALBUTEROL TARTRATE 45 MCG/ACT IN AERO: 2.0000 | INHALATION_SPRAY | RESPIRATORY_TRACT | Status: DC | PRN

## 2017-03-05 MED ORDER — APIXABAN 5 MG PO TABS
5.0000 mg | ORAL_TABLET | Freq: Two times a day (BID) | ORAL | Status: DC
  Administered 2017-03-05 – 2017-03-07 (×5): 5 mg via ORAL
  Filled 2017-03-05 (×5): qty 1

## 2017-03-05 MED ORDER — ALBUTEROL SULFATE (2.5 MG/3ML) 0.083% IN NEBU: 2.5000 mg | INHALATION_SOLUTION | RESPIRATORY_TRACT | Status: DC | PRN

## 2017-03-05 MED ORDER — TORSEMIDE 20 MG PO TABS
20.0000 mg | ORAL_TABLET | Freq: Two times a day (BID) | ORAL | Status: DC
  Administered 2017-03-05 – 2017-03-06 (×2): 20 mg via ORAL
  Filled 2017-03-05 (×2): qty 1

## 2017-03-05 NOTE — Consult Note (Signed)
Cardiology Consultation:   Patient ID: Jay [name]; [id number]; 11/19/1950   Admit date: 03/10/2017 Date of Consult: 03/05/2017  Primary Care Provider: Cathlean Sauer, MD Primary Cardiologist: Dr. Sallyanne Kuster    Patient Profile:   Jay [name] is a 65 y.o. male with a hx of CAD/CABG 2007, cath 2008 2/4 were occluded, last ischemic evaluation by stress ini 2016 noted scar only, no ischemia, chronic CHF (ischemic/nonischemic) last EF 40-45%, known PVCs, (saw Dr. Lovena Le in consult 2016 for syncope,  NSVT and PVCs), HTN, severe COPD (has home O2, rarely uses), OSA w/CPAP, anemia with dx of chron's colitis, Sept 2017 required transfusion, who is being seen today for the evaluation of AFib at the request of Dr. Marlou Porch.  History of Present Illness:   Jay [name] was first noted to have AFlutter vs MAT 01/04/17 out patient at that time recommended to start a/c and planned for DCCV, the patient was reluctant given prior anemic issues, seen 2 days later and was in SR, BB was added (chart mentions unable to tolerate higher doses historically ?).  He was seen in the ER 02/20/17 SOB/palpitations tx with dilt and lasix, discharged next day, in follow up 03/12/2017 in the office he was again very SOB, rates after extra dilt dose was 100 in AFlutter, felt to be overloaded and referred for admission.  He has been diuresed (cumulatively negative 7149ml so far).  The patient  Tells me he thinks this was all initially triggered by a course of steroids he was given to treat a foot infection/gout, then flared up again after a flu shot, and since then has never felt well again.  This all in thel;ast 2 months.  And since his last ER/overnight stay never felt like he was any better off.  He said SOB is his symptoms. He first thought his COPD was acting up, but says ultimately his COPD has never made him feel this SOB.  He remarks that he feels just as SOB today as he did 2 weeks ago and the day he came in this  admission.  No CP, + palpitations.  He is frustrated with remaining edema and feels bloated still.  LABS today: K+ 4.0 BUN/Creat 31/1.27 WBC 10.4 H/H 9.9/31.5 Plts 399  02/20/17 TSH 0.647   Past Medical History:  Diagnosis Date  . Arthritis   . Atrial flutter ([place])   . Borderline diabetes   . Cancer [facility]) 2008   Prostate cancer- s/p Rob. prostate surgery '08  . COPD (chronic obstructive pulmonary disease) ([place])   . Coronary artery disease   . Crohn's disease ([place])   . Dysrhythmia    ? near syncope episodes, arhythmia -not proven cause- symptoms had stop when monitored with continuous rhythm monitor.  [name] GERD (gastroesophageal reflux disease)   . History of home oxygen therapy    2 liters prn and at hs  . Hyperlipidemia   . Hypertension   . Ischemic cardiomyopathy    Ejection fraction 40-45%  . Myocardial infarction [facility]) 2008   x1  . Obesity, Class II, BMI 35-39.9   . Obstructive sleep apnea on CPAP    uses cpap setting of 10  . On home oxygen therapy    "have it available prn" (11/2/20018)  . Psoriasis   . S/P CABG x 4 2007   Dr.  [name]   . Tobacco abuse   . Transfusion history    x 1  -s/p CABPG    Past Surgical History:  Procedure Laterality Date  . CARDIAC CATHETERIZATION  07/04/2006   significant 3 vessel disease: totally vein graft to the ramus intermedius,toalled vein graft to the obtuse marginal, mildly diseased vein graft of the posterior descending artery, mildly depressed LV systolic fx.  . CORONARY ARTERY BYPASS GRAFT  02/14/2006   LIMA to LAD,SVG to posterior descending,SVG to CX,SVG to ramus intermediate.  . CYSTOSCOPY    . XI ROBOTIC ASSISTED SIMPLE PROSTATECTOMY         Inpatient Medications: Scheduled Meds: . allopurinol  300 mg Oral Daily  . atorvastatin  20 mg Oral Daily  . budesonide (PULMICORT) nebulizer solution  0.5 mg Nebulization BID  . diltiazem  60 mg Oral Q6H  . fesoterodine  4 mg Oral Daily  . heparin  5,000 Units  Subcutaneous Q8H  . Living Better with Heart Failure Book   Does not apply Once  . loratadine  10 mg Oral Daily  . mouth rinse  15 mL Mouth Rinse BID  . metoprolol tartrate  25 mg Oral BID  . pantoprazole  40 mg Oral Daily  . potassium chloride  20 mEq Oral BID  . tiotropium  18 mcg Inhalation Daily  . torsemide  20 mg Oral Daily   Continuous Infusions:  PRN Meds: acetaminophen, albuterol  Allergies:    Allergies  Allergen Reactions  . Lipitor [Atorvastatin] Other (See Comments)    Myalgia  . Angiotensin Receptor Blockers Other (See Comments)    Hyperkalemia    Social History:   Social History   Socioeconomic History  . Marital status: Divorced    Spouse name: Not on file  . Number of children: Not on file  . Years of education: Not on file  . Highest education level: Not on file  Social Needs  . Financial resource strain: Not on file  . Food insecurity - worry: Not on file  . Food insecurity - inability: Not on file  . Transportation needs - medical: Not on file  . Transportation needs - non-medical: Not on file  Occupational History  . Not on file  Tobacco Use  . Smoking status: Former Smoker    Packs/day: 2.00    Years: 30.00    Pack years: 60.00    Types: Cigarettes    Last attempt to quit: 04/30/2000    Years since quitting: 16.8  . Smokeless tobacco: Never Used  Substance and Sexual Activity  . Alcohol use: Yes    Alcohol/week: 0.0 oz    Comment: occ. to rare  . Drug use: No  . Sexual activity: Not on file  Other Topics Concern  . Not on file  Social History Narrative  . Not on file    Family History:   Family History  Problem Relation Age of Onset  . Heart attack Father      ROS:  Please see the history of present illness.  ROS  All other ROS reviewed and negative.     Physical Exam/Data:   Vitals:   03/05/17 0750 03/05/17 0812 03/05/17 0845 03/05/17 1124  BP:  117/68  113/76  Pulse:  (!) 28 89 85  Resp:  (!) 25 (!) 23 19  Temp:     97.8 F (36.6 C)  TempSrc:  Oral  Oral  SpO2: 98% 91% 94% 97%  Weight:      Height:        Intake/Output Summary (Last 24 hours) at 03/05/2017 1127 Last data filed at 03/05/2017 0600 Gross per 24 hour  Intake 662 ml  Output 400 ml  Net 262 ml   Filed Weights   03/03/17 0422 03/04/17 0429 03/05/17 0400  Weight: 243 lb 6.2 oz (110.4 kg) 245 lb 6 oz (111.3 kg) 243 lb 12.8 oz (110.6 kg)   Body mass index is 36 kg/m.  General:  Well nourished, well developed, in no acute distress, though gets winded while talking to me is not in any distress HEENT: normal Lymph: no adenopathy Neck: is obses Endocrine:  No thryomegaly Vascular: No carotid bruits Cardiac:  iRRR; no murmurs, gallops or rubs Lungs:  Diminished throughout, slight end exp wheezes b/l, no wheezing, no rhonchi or rales  Abd: soft, nontender  Ext: 1++ edema Musculoskeletal:  No deformities Skin: warm and dry  Neuro:   No gross focal abnormalities noted Psych:  Normal affect   EKG:  The EKG was personally reviewed and demonstrates:   03/21/2017 is AFlutter (appears typical), 81bpm, PVCs 01/07/17: SR, PVCs, 91bpm Telemetry:  Telemetry was personally reviewed and demonstrates:   AFlutter, frequent PVCs, 60's-80's overnight, 80's-90's daytime  Relevant CV Studies:  2D Echo 01/15/17 Study Conclusions - Left ventricle: The cavity size was moderately dilated. Wall thickness was increased in a pattern of mild LVH. Systolic function was mildly to moderately reduced. The estimated ejection fraction was in the range of 40% to 45%. Diffuse hypokinesis. Doppler parameters are consistent with abnormal left ventricular relaxation (grade 1 diastolic dysfunction). Doppler parameters are consistent with high ventricular filling pressure. - Aortic valve: There was trivial regurgitation. - Left atrium: The atrium was mildly dilated. (75mm) - Right ventricle: The cavity size was mildly dilated. - Right atrium: The atrium  was mildly dilated. Impressions: - Mild to moderate global reduction in LV systolic function; mild diastolic dysfunction with elevated LV fillin pressure; moderate LVE; mild LVH; mild biatrial enlargement; mild RVE.    Laboratory Data:  Chemistry Recent Labs  Lab 03/03/17 0225 03/04/17 0304 03/05/17 0302  NA 133* 132* 134*  K 3.5 3.8 4.0  CL 89* 87* 89*  CO2 32 34* 36*  GLUCOSE 119* 119* 133*  BUN 21* 25* 31*  CREATININE 1.13 1.26* 1.27*  CALCIUM 8.8* 8.8* 9.0  GFRNONAA >60 59* 58*  GFRAA >60 >60 >60  ANIONGAP 12 11 9     Recent Labs  Lab 03/01/17 0343  PROT 6.3*  ALBUMIN 3.2*  AST 24  ALT 50  ALKPHOS 79  BILITOT 0.8   Hematology Recent Labs  Lab 03/03/17 0225 03/04/17 0304 03/05/17 0302  WBC 10.1 9.7 10.4  RBC 3.55* 3.28* 3.41*  HGB 10.4* 9.7* 9.9*  HCT 33.4* 30.7* 31.5*  MCV 94.1 93.6 92.4  MCH 29.3 29.6 29.0  MCHC 31.1 31.6 31.4  RDW 14.8 14.9 14.7  PLT 366 367 399   Cardiac Enzymes Recent Labs  Lab 03/04/2017 2248 03/01/17 0343 03/01/17 1613  TROPONINI 0.05* 0.05* 0.05*    Recent Labs  Lab 03/14/2017 1040  TROPIPOC 0.02    BNP Recent Labs  Lab 03/29/2017 1028  BNP 367.1*    DDimer No results for input(s): DDIMER in the last 168 hours.  Radiology/Studies:   Dg Chest 2 View Result Date: 03/03/2017 CLINICAL DATA:  67 year old male with shortness of Breath for 4 days. EXAM: CHEST  2 VIEW COMPARISON:  03/21/2017 and earlier. FINDINGS: AP and lateral views of the chest. Stable cardiomegaly and mediastinal contours. Prior CABG. Stable lung volumes. No pneumothorax or consolidation. Small bilateral pleural effusions have regressed. Decreased pulmonary vascularity. No pulmonary edema. No  acute osseous abnormality identified. Negative visible bowel gas pattern. IMPRESSION: 1. Regressed small bilateral pleural effusions and pulmonary vascular congestion since 03/07/2017. 2. Stable cardiomegaly.  No new cardiopulmonary abnormality. Electronically  Signed   By: Genevie Ann M.D.   On: 03/03/2017 14:12    Assessment and Plan:   1. SOB, CHF exacerbation 2. CM (mixed)     SOB remains unchanged despite diuresis, remains edematous     C/w primary cardiology team  3. AFlutter, onset weeks ago     Appears typical     CHA2DS2Vasc is at least 3, not on a/c 2/2 hx of GIB (pt declined)     Rate appears fairly well controlled currently on lopressor 25mg  BID  4. HTN     Looks OK with current regime  I do not see any mention hx of AFib, or EKGs with AFib Discussed with the patient, he is leary about anticoagulation understandably though willing to consider short duration to get him through an ablation procedure, would like to discuss with Dr. Rayann Heman further.  He remains quite SOB at rest, exam remains fluid OL, diuretic changed from lasix to demadex today, will defer to primary team    For questions or updates, please contact [place] Please consult [email or web address] for contact info under Cardiology/STEMI.   Signed, Baldwin Jamaica, PA-C  03/05/2017 11:27 AM;    I have seen, examined the patient, and reviewed the above assessment and plan.  Changes to above are made where necessary.  On exam, iRRR.  Pt with very symptomatic atrial flutter (Typical) in the setting of severe lung disease.  Not a long term anticoagulation candidate.  I believe that the best approach for him is short term anticoagulation with atrial flutter ablation. Therapeutic strategies for atrial flutter including medicine (rate control) and TEE guided ablation were discussed in detail with the patient today. Risk, benefits, and alternatives to EP study and radiofrequency ablation were also discussed in detail today. These risks include but are not limited to stroke, bleeding, vascular damage, tamponade, perforation, damage to the heart and other structures, AV block requiring pacemaker, worsening renal function, and death. The patient understands these risk and wishes to  proceed.  Make NPO. He has been started on eliquis 5mg  BID yesterday.   Will plan TEE this am followed by atrial flutter ablation if TEE reveals no LAA thrombus.  Thompson Grayer MD, [facility] 03/09/2017 8:42 AM

## 2017-03-05 NOTE — H&P (View-Only) (Signed)
Cardiology Consultation:   Patient ID: Jay [name]; [id number]; 07/20/1950   Admit date: 03/25/2017 Date of Consult: 03/05/2017  Primary Care Provider: Cathlean Sauer, MD Primary Cardiologist: Dr. Sallyanne Kuster    Patient Profile:   Jay [name] is a 67 y.o. male with a hx of CAD/CABG 2007, cath 2008 2/4 were occluded, last ischemic evaluation by stress ini 2016 noted scar only, no ischemia, chronic CHF (ischemic/nonischemic) last EF 40-45%, known PVCs, (saw Dr. Lovena Le in consult 2016 for syncope,  NSVT and PVCs), HTN, severe COPD (has home O2, rarely uses), OSA w/CPAP, anemia with dx of chron's colitis, Sept 2017 required transfusion, who is being seen today for the evaluation of AFib at the request of Dr. Marlou Porch.  History of Present Illness:   Jay [name] was first noted to have AFlutter vs MAT 01/04/17 out patient at that time recommended to start a/c and planned for DCCV, the patient was reluctant given prior anemic issues, seen 2 days later and was in SR, BB was added (chart mentions unable to tolerate higher doses historically ?).  He was seen in the ER 02/20/17 SOB/palpitations tx with dilt and lasix, discharged next day, in follow up 03/27/2017 in the office he was again very SOB, rates after extra dilt dose was 100 in AFlutter, felt to be overloaded and referred for admission.  He has been diuresed (cumulatively negative 7125ml so far).  The patient  Tells me he thinks this was all initially triggered by a course of steroids he was given to treat a foot infection/gout, then flared up again after a flu shot, and since then has never felt well again.  This all in thel;ast 2 months.  And since his last ER/overnight stay never felt like he was any better off.  He said SOB is his symptoms. He first thought his COPD was acting up, but says ultimately his COPD has never made him feel this SOB.  He remarks that he feels just as SOB today as he did 2 weeks ago and the day he came in this  admission.  No CP, + palpitations.  He is frustrated with remaining edema and feels bloated still.  LABS today: K+ 4.0 BUN/Creat 31/1.27 WBC 10.4 H/H 9.9/31.5 Plts 399  02/20/17 TSH 0.647   Past Medical History:  Diagnosis Date  . Arthritis   . Atrial flutter ([place])   . Borderline diabetes   . Cancer [facility]) 2008   Prostate cancer- s/p Rob. prostate surgery '08  . COPD (chronic obstructive pulmonary disease) ([place])   . Coronary artery disease   . Crohn's disease ([place])   . Dysrhythmia    ? near syncope episodes, arhythmia -not proven cause- symptoms had stop when monitored with continuous rhythm monitor.  [name] GERD (gastroesophageal reflux disease)   . History of home oxygen therapy    2 liters prn and at hs  . Hyperlipidemia   . Hypertension   . Ischemic cardiomyopathy    Ejection fraction 40-45%  . Myocardial infarction [facility]) 2008   x1  . Obesity, Class II, BMI 35-39.9   . Obstructive sleep apnea on CPAP    uses cpap setting of 10  . On home oxygen therapy    "have it available prn" (11/2/20018)  . Psoriasis   . S/P CABG x 4 2007   Dr.  [name]   . Tobacco abuse   . Transfusion history    x 1  -s/p CABPG    Past Surgical History:  Procedure Laterality Date  . CARDIAC CATHETERIZATION  07/04/2006   significant 3 vessel disease: totally vein graft to the ramus intermedius,toalled vein graft to the obtuse marginal, mildly diseased vein graft of the posterior descending artery, mildly depressed LV systolic fx.  . CORONARY ARTERY BYPASS GRAFT  02/14/2006   LIMA to LAD,SVG to posterior descending,SVG to CX,SVG to ramus intermediate.  . CYSTOSCOPY    . XI ROBOTIC ASSISTED SIMPLE PROSTATECTOMY         Inpatient Medications: Scheduled Meds: . allopurinol  300 mg Oral Daily  . atorvastatin  20 mg Oral Daily  . budesonide (PULMICORT) nebulizer solution  0.5 mg Nebulization BID  . diltiazem  60 mg Oral Q6H  . fesoterodine  4 mg Oral Daily  . heparin  5,000 Units  Subcutaneous Q8H  . Living Better with Heart Failure Book   Does not apply Once  . loratadine  10 mg Oral Daily  . mouth rinse  15 mL Mouth Rinse BID  . metoprolol tartrate  25 mg Oral BID  . pantoprazole  40 mg Oral Daily  . potassium chloride  20 mEq Oral BID  . tiotropium  18 mcg Inhalation Daily  . torsemide  20 mg Oral Daily   Continuous Infusions:  PRN Meds: acetaminophen, albuterol  Allergies:    Allergies  Allergen Reactions  . Lipitor [Atorvastatin] Other (See Comments)    Myalgia  . Angiotensin Receptor Blockers Other (See Comments)    Hyperkalemia    Social History:   Social History   Socioeconomic History  . Marital status: Divorced    Spouse name: Not on file  . Number of children: Not on file  . Years of education: Not on file  . Highest education level: Not on file  Social Needs  . Financial resource strain: Not on file  . Food insecurity - worry: Not on file  . Food insecurity - inability: Not on file  . Transportation needs - medical: Not on file  . Transportation needs - non-medical: Not on file  Occupational History  . Not on file  Tobacco Use  . Smoking status: Former Smoker    Packs/day: 2.00    Years: 30.00    Pack years: 60.00    Types: Cigarettes    Last attempt to quit: 04/30/2000    Years since quitting: 16.8  . Smokeless tobacco: Never Used  Substance and Sexual Activity  . Alcohol use: Yes    Alcohol/week: 0.0 oz    Comment: occ. to rare  . Drug use: No  . Sexual activity: Not on file  Other Topics Concern  . Not on file  Social History Narrative  . Not on file    Family History:   Family History  Problem Relation Age of Onset  . Heart attack Father      ROS:  Please see the history of present illness.  ROS  All other ROS reviewed and negative.     Physical Exam/Data:   Vitals:   03/05/17 0750 03/05/17 0812 03/05/17 0845 03/05/17 1124  BP:  117/68  113/76  Pulse:  (!) 28 89 85  Resp:  (!) 25 (!) 23 19  Temp:     97.8 F (36.6 C)  TempSrc:  Oral  Oral  SpO2: 98% 91% 94% 97%  Weight:      Height:        Intake/Output Summary (Last 24 hours) at 03/05/2017 1127 Last data filed at 03/05/2017 0600 Gross per 24 hour  Intake 662 ml  Output 400 ml  Net 262 ml   Filed Weights   03/03/17 0422 03/04/17 0429 03/05/17 0400  Weight: 243 lb 6.2 oz (110.4 kg) 245 lb 6 oz (111.3 kg) 243 lb 12.8 oz (110.6 kg)   Body mass index is 36 kg/m.  General:  Well nourished, well developed, in no acute distress, though gets winded while talking to me is not in any distress HEENT: normal Lymph: no adenopathy Neck: is obses Endocrine:  No thryomegaly Vascular: No carotid bruits Cardiac:  iRRR; no murmurs, gallops or rubs Lungs:  Diminished throughout, slight end exp wheezes b/l, no wheezing, no rhonchi or rales  Abd: soft, nontender  Ext: 1++ edema Musculoskeletal:  No deformities Skin: warm and dry  Neuro:   No gross focal abnormalities noted Psych:  Normal affect   EKG:  The EKG was personally reviewed and demonstrates:   03/02/2017 is AFlutter (appears typical), 81bpm, PVCs 01/07/17: SR, PVCs, 91bpm Telemetry:  Telemetry was personally reviewed and demonstrates:   AFlutter, frequent PVCs, 60's-80's overnight, 80's-90's daytime  Relevant CV Studies:  2D Echo 01/15/17 Study Conclusions - Left ventricle: The cavity size was moderately dilated. Wall thickness was increased in a pattern of mild LVH. Systolic function was mildly to moderately reduced. The estimated ejection fraction was in the range of 40% to 45%. Diffuse hypokinesis. Doppler parameters are consistent with abnormal left ventricular relaxation (grade 1 diastolic dysfunction). Doppler parameters are consistent with high ventricular filling pressure. - Aortic valve: There was trivial regurgitation. - Left atrium: The atrium was mildly dilated. (2mm) - Right ventricle: The cavity size was mildly dilated. - Right atrium: The atrium  was mildly dilated. Impressions: - Mild to moderate global reduction in LV systolic function; mild diastolic dysfunction with elevated LV fillin pressure; moderate LVE; mild LVH; mild biatrial enlargement; mild RVE.    Laboratory Data:  Chemistry Recent Labs  Lab 03/03/17 0225 03/04/17 0304 03/05/17 0302  NA 133* 132* 134*  K 3.5 3.8 4.0  CL 89* 87* 89*  CO2 32 34* 36*  GLUCOSE 119* 119* 133*  BUN 21* 25* 31*  CREATININE 1.13 1.26* 1.27*  CALCIUM 8.8* 8.8* 9.0  GFRNONAA >60 59* 58*  GFRAA >60 >60 >60  ANIONGAP 12 11 9     Recent Labs  Lab 03/01/17 0343  PROT 6.3*  ALBUMIN 3.2*  AST 24  ALT 50  ALKPHOS 79  BILITOT 0.8   Hematology Recent Labs  Lab 03/03/17 0225 03/04/17 0304 03/05/17 0302  WBC 10.1 9.7 10.4  RBC 3.55* 3.28* 3.41*  HGB 10.4* 9.7* 9.9*  HCT 33.4* 30.7* 31.5*  MCV 94.1 93.6 92.4  MCH 29.3 29.6 29.0  MCHC 31.1 31.6 31.4  RDW 14.8 14.9 14.7  PLT 366 367 399   Cardiac Enzymes Recent Labs  Lab 03/03/2017 2248 03/01/17 0343 03/01/17 1613  TROPONINI 0.05* 0.05* 0.05*    Recent Labs  Lab 03/16/2017 1040  TROPIPOC 0.02    BNP Recent Labs  Lab 03/07/2017 1028  BNP 367.1*    DDimer No results for input(s): DDIMER in the last 168 hours.  Radiology/Studies:   Dg Chest 2 View Result Date: 03/03/2017 CLINICAL DATA:  66 year old male with shortness of Breath for 4 days. EXAM: CHEST  2 VIEW COMPARISON:  03/07/2017 and earlier. FINDINGS: AP and lateral views of the chest. Stable cardiomegaly and mediastinal contours. Prior CABG. Stable lung volumes. No pneumothorax or consolidation. Small bilateral pleural effusions have regressed. Decreased pulmonary vascularity. No pulmonary edema. No  acute osseous abnormality identified. Negative visible bowel gas pattern. IMPRESSION: 1. Regressed small bilateral pleural effusions and pulmonary vascular congestion since 03/03/2017. 2. Stable cardiomegaly.  No new cardiopulmonary abnormality. Electronically  Signed   By: Genevie Ann M.D.   On: 03/03/2017 14:12    Assessment and Plan:   1. SOB, CHF exacerbation 2. CM (mixed)     SOB remains unchanged despite diuresis, remains edematous     C/w primary cardiology team  3. AFlutter, onset weeks ago     Appears typical     CHA2DS2Vasc is at least 3, not on a/c 2/2 hx of GIB (pt declined)     Rate appears fairly well controlled currently on lopressor 25mg  BID  4. HTN     Looks OK with current regime  I do not see any mention hx of AFib, or EKGs with AFib Discussed with the patient, he is leary about anticoagulation understandably though willing to consider short duration to get him through an ablation procedure, would like to discuss with Dr. Rayann Heman further.  He remains quite SOB at rest, exam remains fluid OL, diuretic changed from lasix to demadex today, will defer to primary team    For questions or updates, please contact [place] Please consult [email or web address] for contact info under Cardiology/STEMI.   Signed, Baldwin Jamaica, PA-C  03/05/2017 11:27 AM;    I have seen, examined the patient, and reviewed the above assessment and plan.  Changes to above are made where necessary.  On exam, iRRR.  Pt with very symptomatic atrial flutter (Typical) in the setting of severe lung disease.  Not a long term anticoagulation candidate.  I believe that the best approach for him is short term anticoagulation with atrial flutter ablation. Therapeutic strategies for atrial flutter including medicine (rate control) and TEE guided ablation were discussed in detail with the patient today. Risk, benefits, and alternatives to EP study and radiofrequency ablation were also discussed in detail today. These risks include but are not limited to stroke, bleeding, vascular damage, tamponade, perforation, damage to the heart and other structures, AV block requiring pacemaker, worsening renal function, and death. The patient understands these risk and wishes to  proceed.  Make NPO. He has been started on eliquis 5mg  BID yesterday.   Will plan TEE this am followed by atrial flutter ablation if TEE reveals no LAA thrombus.  Thompson Grayer MD, [facility] 03/02/2017 8:42 AM

## 2017-03-05 NOTE — Care Management Note (Signed)
Case Management Note Marvetta Gibbons RN, BSN Unit 4E-Case Manager [phone]  Patient Details  Name: Jay [name] MRN: [id number] Date of Birth: [birth year]  Subjective/Objective:   Pt admitted with aflutter                 Action/Plan: PTA pt lived at home with mother- per PT eval no recommendations for f/u- anticipate return home- CM to follow.   Expected Discharge Date:                  Expected Discharge Plan:  [place]  In-House Referral:     Discharge planning Services  CM Consult  Post Acute Care Choice:    Choice offered to:     DME Arranged:    DME Agency:     HH Arranged:    [place] Agency:     Status of Service:  In process, will continue to follow  If discussed at Long Length of Stay Meetings, dates discussed:    Discharge Disposition:   Additional Comments:  Dawayne Patricia, RN 03/05/2017, 2:34 PM

## 2017-03-05 NOTE — Progress Notes (Signed)
Occult stool sample is negative.

## 2017-03-05 NOTE — Progress Notes (Signed)
Progress Note  Patient Name: Jay [name] Date of Encounter: 03/05/2017  Primary Cardiologist: Dr. Lorenza Cambridge  Subjective   Shortness of breath present, no chest pain.  Inpatient Medications    Scheduled Meds: . allopurinol  300 mg Oral Daily  . atorvastatin  20 mg Oral Daily  . budesonide (PULMICORT) nebulizer solution  0.5 mg Nebulization BID  . diltiazem  60 mg Oral Q6H  . fesoterodine  4 mg Oral Daily  . furosemide  40 mg Oral BID  . heparin  5,000 Units Subcutaneous Q8H  . Living Better with Heart Failure Book   Does not apply Once  . loratadine  10 mg Oral Daily  . mouth rinse  15 mL Mouth Rinse BID  . metoprolol tartrate  12.5 mg Oral Once  . metoprolol tartrate  25 mg Oral BID  . pantoprazole  40 mg Oral Daily  . potassium chloride  20 mEq Oral BID  . tiotropium  18 mcg Inhalation Daily   Continuous Infusions:  PRN Meds: acetaminophen   Vital Signs    Vitals:   03/05/17 0749 03/05/17 0750 03/05/17 0812 03/05/17 0845  BP:   117/68   Pulse:   (!) 28 89  Resp:   (!) 25 (!) 23  Temp:      TempSrc:   Oral   SpO2: 98% 98% 91% 94%  Weight:      Height:        Intake/Output Summary (Last 24 hours) at 03/05/2017 1004 Last data filed at 03/05/2017 0600 Gross per 24 hour  Intake 802 ml  Output 700 ml  Net 102 ml   Filed Weights   03/03/17 0422 03/04/17 0429 03/05/17 0400  Weight: 243 lb 6.2 oz (110.4 kg) 245 lb 6 oz (111.3 kg) 243 lb 12.8 oz (110.6 kg)    Telemetry    Atrial flutter controlled heart rate-personally Reviewed  ECG    Atrial flutter with rapid ventricular response- Personally Reviewed  Physical Exam   GEN: Well nourished, well developed, in no acute distress obese HEENT: normal  Neck: no JVD, carotid bruits, or masses Cardiac: irreg irreg, normal rate; no murmurs, rubs, or gallops,no edema  Respiratory: Poor air movement bilaterally, wheezes bilaterally, increased work of breathing GI: soft, nontender, nondistended, + BS MS:  no deformity or atrophy  Skin: warm and dry, no rash Neuro:  Alert and Oriented x 3, Strength and sensation are intact Psych: euthymic mood, full affect   Labs    Chemistry Recent Labs  Lab 03/01/17 0343  03/03/17 0225 03/04/17 0304 03/05/17 0302  NA 138   < > 133* 132* 134*  K 3.8   < > 3.5 3.8 4.0  CL 95*   < > 89* 87* 89*  CO2 34*   < > 32 34* 36*  GLUCOSE 138*   < > 119* 119* 133*  BUN 15   < > 21* 25* 31*  CREATININE 1.12   < > 1.13 1.26* 1.27*  CALCIUM 9.2   < > 8.8* 8.8* 9.0  PROT 6.3*  --   --   --   --   ALBUMIN 3.2*  --   --   --   --   AST 24  --   --   --   --   ALT 50  --   --   --   --   ALKPHOS 79  --   --   --   --   BILITOT 0.8  --   --   --   --  GFRNONAA >60   < > >60 59* 58*  GFRAA >60   < > >60 >60 >60  ANIONGAP 9   < > 12 11 9    < > = values in this interval not displayed.     Hematology Recent Labs  Lab 03/03/17 0225 03/04/17 0304 03/05/17 0302  WBC 10.1 9.7 10.4  RBC 3.55* 3.28* 3.41*  HGB 10.4* 9.7* 9.9*  HCT 33.4* 30.7* 31.5*  MCV 94.1 93.6 92.4  MCH 29.3 29.6 29.0  MCHC 31.1 31.6 31.4  RDW 14.8 14.9 14.7  PLT 366 367 399    Cardiac Enzymes Recent Labs  Lab 03/07/2017 2248 03/01/17 0343 03/01/17 1613  TROPONINI 0.05* 0.05* 0.05*    Recent Labs  Lab 03/25/2017 1040  TROPIPOC 0.02     BNP Recent Labs  Lab 03/03/2017 1028  BNP 367.1*     DDimer No results for input(s): DDIMER in the last 168 hours.   Radiology    Dg Chest 2 View  Result Date: 03/03/2017 CLINICAL DATA:  66 year old male with shortness of Breath for 4 days. EXAM: CHEST  2 VIEW COMPARISON:  03/15/2017 and earlier. FINDINGS: AP and lateral views of the chest. Stable cardiomegaly and mediastinal contours. Prior CABG. Stable lung volumes. No pneumothorax or consolidation. Small bilateral pleural effusions have regressed. Decreased pulmonary vascularity. No pulmonary edema. No acute osseous abnormality identified. Negative visible bowel gas pattern. IMPRESSION:  1. Regressed small bilateral pleural effusions and pulmonary vascular congestion since 03/07/2017. 2. Stable cardiomegaly.  No new cardiopulmonary abnormality. Electronically Signed   By: Genevie Ann M.D.   On: 03/03/2017 14:12    Cardiac Studies   Echocardiogram 01/15/17: -EF 40-45%, mildly dilated left atrium.  Patient Profile     66 y.o. 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 He saw Dr.Croitoru in the office 01/04/17 and was noted to be in atrial flutter vs MAT. Dr Sallyanne Kuster recommend starting anticoagulation and setting up TEE guided cardioversion. The pt was reluctant to start anticoagulation secondary to his severe anemia requiring transfusion in 2017. Dr Sallyanne Kuster felt that in the long run he'dprobably do best with radiofrequency ablation, because of hisrisk for recurrent bleeding secondary to Crohn's.Recently readmitted 10/24 with atrial flutter RVR and a/c combined CHF. Given impending knee surgery, decision was made to delay anticoagulation until felt safe - discharged in atrial flutter HR 90s-100s. See in office 11/1 with marked dyspnea, 18lb weight gain, tachycardic despite outpatient doubling of Lasix/added metolazone. Office weight 05/2016 248lb and feeling well; discharged at 245lb on 10/25.    Assessment & Plan    Atrial fibrillation/flutter -Currently on a combination of Cardizem and Lopressor.  Improved heart rate.  Looked at telemetry, in general he has been maintaining heart rates in the 80s.  Occasionally will increase into the low 100s.  This is with activity.  Per review of records therapies for atrial flutter have been limited due to complex anticoagulation situation, patient reluctant because of prior anemia, Crohn's disease and pending upcoming knee surgery.  I agree with my colleagues that think we should have EP discussed this  with him.  Would he be an ablation candidate, especially in light of his resistance to anticoagulation and underlying lung condition? I have called Trish to help facilitate consultation.  Acute on chronic systolic/diastolic heart failure -Admit weight was 258, current weight 243.  Improved.  Lasix 40 p.o. twice daily.  Net negative is 7.1 L  Coronary artery disease status post CABG -Minimally elevated troponin likely demand ischemia in the setting of tachycardia.  No chest pain.  Consider evaluation in the future.  Anemia -Hemoglobin in October 2017 was 9.7.  Most recent of 52.  COPD -2 L at night and at home.  Clearly he is continuing to have trouble moving air on exam.  He has wheezes bilaterally.  This is playing a large role in his dyspnea.  Morbid obesity-2 comorbidities with BMI greater than 35.  Continue to encourage weight loss. Body mass index is 36 kg/m.    For questions or updates, please contact [place] Please consult [email or web address] for contact info under Cardiology/STEMI.      Signed, Candee Furbish, MD  03/05/2017, 10:04 AM

## 2017-03-05 NOTE — Evaluation (Signed)
Physical Therapy Evaluation Patient Details Name: Jay [name] MRN: [id number] DOB: February 10, 1951 Today's Date: 03/05/2017   History of Present Illness  Jay [name] is a 66 y.o. male presenting on 03/03/2017 with CHF exacerbation with acute hypoxic respiratory failure. Per chart review, patient has been having recurrent A. fib with RVR and is not receiving any anticoagulation due to his Crohn's disease. PMH is significant for psoriatic arthritis, COPD, Crohn's disease, a-fib (no anticoagulation), status post CABG x4, OSA on CPAP, chronic combined CHF, obesity.     Clinical Impression  Pt presents with bilateral ankle/foot swelling, pain, and an overall decrease in functional mobility secondary to above. PTA, pt indep with all mobility. Pt declining ambulation this session secondary to bilateral foot swelling, but willing to participate in seated/standing therex, requiring supervision for safety; CHF HEP handout provided. SpO2 remained >90% on 4L O2 [place]. Education on importance of mobility, activity pacing, and fall risk reduction at home. Pt is not interested in outpatient cardiac rehab at this time. Pt would benefit from continued acute PT services to maximize functional mobility and independence prior to d/c home.     Follow Up Recommendations No PT follow up;Supervision - Intermittent    Equipment Recommendations  None recommended by PT    Recommendations for Other Services       Precautions / Restrictions Precautions Precautions: Fall Restrictions Weight Bearing Restrictions: No      Mobility  Bed Mobility Overal bed mobility: Independent             General bed mobility comments: Received sitting in chair; pt reports indep with bed mob  Transfers Overall transfer level: Needs assistance Equipment used: None Transfers: Sit to/from Stand Sit to Stand: Supervision         General transfer comment: Supervision for safety. Pt able to perform repeated sit-to-stands,  reliant on BUEs to push into standing  Ambulation/Gait Ambulation/Gait assistance: Supervision Ambulation Distance (Feet): 3 Feet Assistive device: None Gait Pattern/deviations: Step-through pattern;Antalgic     General Gait Details: Pt declining ambulation secondary to bilateral foot pain/swelling, despite max encouragement. Willing to stand for therex, ambulating short distance in front of chair with no AD and supervision for safety. SpO2 >90% on 4L O2   Stairs            Wheelchair Mobility    Modified Rankin (Stroke Patients Only)       Balance Overall balance assessment: Needs assistance Sitting-balance support: No upper extremity supported;Feet unsupported;Feet supported Sitting balance-Leahy Scale: Good     Standing balance support: No upper extremity supported;During functional activity Standing balance-Leahy Scale: Good                               Pertinent Vitals/Pain Pain Assessment: Faces Faces Pain Scale: Hurts little more Pain Location: Bilateral feet Pain Descriptors / Indicators: Sore;Discomfort Pain Intervention(s): Limited activity within patient's tolerance;Monitored during session    Home Living Family/patient expects to be discharged to:: Private residence Living Arrangements: Spouse/significant other Available Help at Discharge: Family;Available 24 hours/day Type of Home: House Home Access: Level entry     Home Layout: One level Home Equipment: None      Prior Function Level of Independence: Independent         Comments: Immediately PTA, limited household amb secondary to increased WOB     Hand Dominance        Extremity/Trunk Assessment   Upper Extremity Assessment Upper  Extremity Assessment: Overall WFL for tasks assessed    Lower Extremity Assessment Lower Extremity Assessment: Overall WFL for tasks assessed;LLE deficits/detail;RLE deficits/detail RLE Deficits / Details: 2+ edema right ankle/foot LLE  Deficits / Details: 3+ edema left ankle/foot    Cervical / Trunk Assessment Cervical / Trunk Assessment: Normal  Communication   Communication: No difficulties  Cognition Arousal/Alertness: Awake/alert Behavior During Therapy: WFL for tasks assessed/performed Overall Cognitive Status: Within Functional Limits for tasks assessed                                        General Comments      Exercises     Assessment/Plan    PT Assessment Patient needs continued PT services  PT Problem List Decreased activity tolerance;Decreased mobility;Cardiopulmonary status limiting activity;Pain       PT Treatment Interventions DME instruction;Gait training;Stair training;Functional mobility training;Therapeutic activities;Therapeutic exercise;Balance training;Patient/family education    PT Goals (Current goals can be found in the Care Plan section)  Acute Rehab PT Goals Patient Stated Goal: Return home PT Goal Formulation: With patient Time For Goal Achievement: 03/19/17 Potential to Achieve Goals: Good    Frequency Min 3X/week   Barriers to discharge        Co-evaluation               AM-PAC PT "6 Clicks" Daily Activity  Outcome Measure Difficulty turning over in bed (including adjusting bedclothes, sheets and blankets)?: None Difficulty moving from lying on back to sitting on the side of the bed? : None Difficulty sitting down on and standing up from a chair with arms (e.g., wheelchair, bedside commode, etc,.)?: A Little Help needed moving to and from a bed to chair (including a wheelchair)?: A Little Help needed walking in hospital room?: A Little Help needed climbing 3-5 steps with a railing? : A Little 6 Click Score: 20    End of Session   Activity Tolerance: Patient limited by pain Patient left: in chair;with call bell/phone within reach Nurse Communication: Mobility status PT Visit Diagnosis: Other abnormalities of gait and mobility (R26.89)     Time: 6294-7654 PT Time Calculation (min) (ACUTE ONLY): 17 min   Charges:   PT Evaluation $PT Eval Moderate Complexity: 1 Mod     PT G Codes:       Mabeline Caras, PT, DPT Acute Rehab Services  Pager: [place] 03/05/2017, 11:36 AM

## 2017-03-05 NOTE — Progress Notes (Signed)
Family Medicine Teaching Service Daily Progress Note Intern Pager: [phone]  Patient name: Jay [name] Medical record number: [id number] Date of birth: 31-May-1950 Age: 66 y.o. Gender: male  Primary Care Provider: Cathlean Sauer, MD Consultants: cards Code Status: DNR  Pt Overview and Major Events to Date:  Jay [name] is a 66 y.o. male presenting with acute hypoxic respiratory failure. PMH is significant for psoriatic arthritis, COPD, Crohn's disease, atrial fibrillation (no anticoagulation), status post CABG x4, OSA on CPAP, chronic combined congestive heart failure   Assessment and Plan: Jay [name] is a 66 y.o. male presenting with acute hypoxic respiratory failure. PMH is significant for psoriatic arthritis, COPD, Crohn's disease, atrial fibrillation (no anticoagulation), status post CABG x4, OSA on CPAP, chronic combined congestive heart failure   Combined CHF exacerbation with acute hypoxic respiratory failure: EF 40-45% with G1DD (9/18). Dry weight=234 lbs. Exacerbation is most likely secondary to the persistent tachycardia for the past month. Minimally elevated trop (0.05) thought to be secondary to demand ischemia. Requiring 3-4L O2 by [place].  Weight is down 9lbs (254>243 lbs) and UOP 7.3L.  -telemetry  -Lasix 40 mg PO BID  -Continuous pulse ox and supplemental O2 prn  -Cardiology consulted; appreciate recs  -home Toprol XL 25 mg daily changed to succinate 12.5bid -Strict I's and O's -Daily weights  AFib: Currently rate controlled transition today to PO dilt instead of drip. Per chart review, patient has been having recurrent A. fib with RVR and is not receiving any anticoagulation due to his Crohn's disease.  -home Toprol XL 25 mg daily changed to succinate 12.5bid -dilt 60mg  PO q6, d/cing dilt drip -Cardiology consulted -EP consulted to discuss potential ablation -will call pallitaive if EP does not propose intervention  COPD: 2L by [place] at night and prn at  home. No evidence of exacerbation. Former smoker. Patient refuses cpap -Continuous pulse ox -change Dulera 2 puffs BID to flovent 220 2 puffs BID for concern of HR -home Spiriva daily -Titrate oxygen to >90% O2  Normocytic Anemia-Hgb 9.9 down from baseline ~10.5-11.5.  Hx of bleeding and not getting IVF currently so dilution not likely -will discuss with team and evaluate for possible causes as no known bleed currently.  Iron panel significant for low iron (25) and elevated ferritin (1,051) and saturation rations low (8%) -FOBT ordered  Hyponatremia- 134 (slightly resolved from 11/5) patient has not been on IVF and is diuresed 7.2L since admission -will monitor with intervention threshold ~129-130  Crohn's disease Stable  CAD with history of CABG x4 No chest pain. EKG showing atrial flutter but no signs of ischemia.   OSA -CPAP at night  Hx of prostate cancer w/ prostectomy-  -home fesoterodine for incontinence  FEN/GI: low sodium diet/protonix Prophylaxis: subcutaneous hep  Disposition: Pending EP consult, I called the cards fellow overnight to try and ensure EP would drop note.  Subjective:  Patient is frustrated with not having an answer and still having SOB.  He notes edema in feet is worse than normal and per him, not decreaign despite 7L urine output  Objective: Temp:  [97.7 F (36.5 C)-98.9 F (37.2 C)] 98.9 F (37.2 C) (11/06 0002) Pulse Rate:  [58-101] 58 (11/06 0002) Resp:  [16-30] 22 (11/06 0002) BP: (102-118)/(60-88) 106/87 (11/06 0002) SpO2:  [91 %-100 %] 91 % (11/06 0002) Weight:  [245 lb 6 oz (111.3 kg)] 245 lb 6 oz (111.3 kg) (11/05 0429) Physical Exam: General: 66 year old man laying in bed in NAD, uncomfortable Cardiovascular: Tachycardic  and irregularly irregular, no apparent murmurs Respiratory: increased WOB, crackles in the bilateral bases with diminished breath sounds Extremities: 1+ pitting edema to the knees bilaterally, specifically feet  are 2+ Neuro: Alert and oriented x3, no gross neurological deficits Psych: Pleasant, seems somewhat hopeless about health recovery, frustrated with not seeing a recovery in his future  Laboratory: Recent Labs  Lab 03/02/17 0337 03/03/17 0225 03/04/17 0304  WBC 10.6* 10.1 9.7  HGB 10.7* 10.4* 9.7*  HCT 33.9* 33.4* 30.7*  PLT 379 366 367   Recent Labs  Lab 03/01/17 0343 03/02/17 0337 03/03/17 0225 03/04/17 0304  NA 138 136 133* 132*  K 3.8 3.6 3.5 3.8  CL 95* 91* 89* 87*  CO2 34* 34* 32 34*  BUN 15 15 21* 25*  CREATININE 1.12 1.16 1.13 1.26*  CALCIUM 9.2 8.9 8.8* 8.8*  PROT 6.3*  --   --   --   BILITOT 0.8  --   --   --   ALKPHOS 79  --   --   --   ALT 50  --   --   --   AST 24  --   --   --   GLUCOSE 138* 125* 119* 119*   Imaging/Diagnostic Tests: Dg Chest 2 View  Result Date: 03/12/2017 CLINICAL DATA:  Shortness of breath, CHF, worse over the last month EXAM: CHEST  2 VIEW COMPARISON:  Chest x-ray of 02/20/2017 FINDINGS: There is cardiomegaly present with some fullness of the perihilar vasculature possibly indicating mild pulmonary vascular congestion, without definite congestive heart failure. Tiny pleural effusions cannot be excluded with some blunting of the posterior costophrenic angles. No pneumonia is seen. Median sternotomy sutures are present from prior CABG. No acute bony abnormality is seen. IMPRESSION: Cardiomegaly.  Probable mild pulmonary vascular congestion. Electronically Signed   By: Ivar Drape M.D.   On: 03/25/2017 11:07     Sherene Sires, DO 03/05/2017, 1:39 AM PGY-3, [place] Intern pager: [phone], text pages welcome

## 2017-03-06 ENCOUNTER — Ambulatory Visit (HOSPITAL_COMMUNITY): Admit: 2017-03-06 | Payer: Medicare HMO | Admitting: Internal Medicine

## 2017-03-06 ENCOUNTER — Inpatient Hospital Stay (HOSPITAL_COMMUNITY): Payer: Medicare HMO | Admitting: Certified Registered Nurse Anesthetist

## 2017-03-06 ENCOUNTER — Encounter (HOSPITAL_COMMUNITY): Admission: EM | Disposition: E | Payer: Self-pay | Source: Home / Self Care | Attending: Family Medicine

## 2017-03-06 ENCOUNTER — Inpatient Hospital Stay (HOSPITAL_COMMUNITY): Payer: Medicare HMO

## 2017-03-06 DIAGNOSIS — I340 Nonrheumatic mitral (valve) insufficiency: Secondary | ICD-10-CM

## 2017-03-06 DIAGNOSIS — I4892 Unspecified atrial flutter: Secondary | ICD-10-CM

## 2017-03-06 HISTORY — PX: A-FLUTTER ABLATION: EP1230

## 2017-03-06 LAB — CBC
HCT: 29.7 % — ABNORMAL LOW (ref 39.0–52.0)
HEMOGLOBIN: 9.4 g/dL — AB (ref 13.0–17.0)
MCH: 29.7 pg (ref 26.0–34.0)
MCHC: 31.6 g/dL (ref 30.0–36.0)
MCV: 93.7 fL (ref 78.0–100.0)
Platelets: 398 10*3/uL (ref 150–400)
RBC: 3.17 MIL/uL — AB (ref 4.22–5.81)
RDW: 14.6 % (ref 11.5–15.5)
WBC: 11.5 10*3/uL — AB (ref 4.0–10.5)

## 2017-03-06 LAB — BASIC METABOLIC PANEL
ANION GAP: 10 (ref 5–15)
BUN: 33 mg/dL — ABNORMAL HIGH (ref 6–20)
CHLORIDE: 91 mmol/L — AB (ref 101–111)
CO2: 34 mmol/L — ABNORMAL HIGH (ref 22–32)
CREATININE: 1.3 mg/dL — AB (ref 0.61–1.24)
Calcium: 8.8 mg/dL — ABNORMAL LOW (ref 8.9–10.3)
GFR calc non Af Amer: 56 mL/min — ABNORMAL LOW (ref >60)
Glucose, Bld: 130 mg/dL — ABNORMAL HIGH (ref 65–99)
Potassium: 3.9 mmol/L (ref 3.5–5.1)
Sodium: 135 mmol/L (ref 135–145)

## 2017-03-06 LAB — PROTIME-INR
INR: 1.46
PROTHROMBIN TIME: 17.6 s — AB (ref 11.4–15.2)

## 2017-03-06 SURGERY — A-FLUTTER ABLATION
Anesthesia: General

## 2017-03-06 MED ORDER — FUROSEMIDE 10 MG/ML IJ SOLN
40.0000 mg | Freq: Once | INTRAMUSCULAR | Status: AC
  Administered 2017-03-06: 40 mg via INTRAVENOUS
  Filled 2017-03-06: qty 4

## 2017-03-06 MED ORDER — OXYCODONE HCL 5 MG PO TABS: 5.0000 mg | ORAL_TABLET | Freq: Once | ORAL | Status: DC | PRN

## 2017-03-06 MED ORDER — LIDOCAINE HCL (CARDIAC) 20 MG/ML IV SOLN
INTRAVENOUS | Status: DC | PRN
  Administered 2017-03-06: 100 mg via INTRAVENOUS

## 2017-03-06 MED ORDER — SODIUM CHLORIDE 0.9% FLUSH
3.0000 mL | Freq: Two times a day (BID) | INTRAVENOUS | Status: DC
  Administered 2017-03-06 – 2017-03-07 (×3): 3 mL via INTRAVENOUS

## 2017-03-06 MED ORDER — FENTANYL CITRATE (PF) 250 MCG/5ML IJ SOLN
INTRAMUSCULAR | Status: AC
  Filled 2017-03-06: qty 5

## 2017-03-06 MED ORDER — HYDROCODONE-ACETAMINOPHEN 5-325 MG PO TABS
1.0000 | ORAL_TABLET | ORAL | Status: DC | PRN
  Administered 2017-03-07: 2 via ORAL
  Filled 2017-03-06: qty 2

## 2017-03-06 MED ORDER — FENTANYL CITRATE (PF) 100 MCG/2ML IJ SOLN
INTRAMUSCULAR | Status: DC | PRN
  Administered 2017-03-06: 100 ug via INTRAVENOUS
  Administered 2017-03-06: 50 ug via INTRAVENOUS

## 2017-03-06 MED ORDER — ONDANSETRON HCL 4 MG/2ML IJ SOLN: 4.0000 mg | Freq: Once | INTRAMUSCULAR | Status: DC | PRN

## 2017-03-06 MED ORDER — FUROSEMIDE 10 MG/ML IJ SOLN
80.0000 mg | Freq: Two times a day (BID) | INTRAMUSCULAR | Status: DC
  Administered 2017-03-06 – 2017-03-07 (×3): 80 mg via INTRAVENOUS
  Filled 2017-03-06 (×3): qty 8

## 2017-03-06 MED ORDER — LACTATED RINGERS IV SOLN: INTRAVENOUS | Status: DC | PRN

## 2017-03-06 MED ORDER — CARVEDILOL 6.25 MG PO TABS
6.2500 mg | ORAL_TABLET | Freq: Two times a day (BID) | ORAL | Status: DC
  Administered 2017-03-06 – 2017-03-07 (×3): 6.25 mg via ORAL
  Filled 2017-03-06 (×3): qty 1

## 2017-03-06 MED ORDER — MIDAZOLAM HCL 5 MG/5ML IJ SOLN
INTRAMUSCULAR | Status: DC | PRN
  Administered 2017-03-06: 2 mg via INTRAVENOUS

## 2017-03-06 MED ORDER — SODIUM CHLORIDE 0.9 % IV SOLN: 250.0000 mL | INTRAVENOUS | Status: DC | PRN

## 2017-03-06 MED ORDER — SODIUM CHLORIDE 0.9% FLUSH: 3.0000 mL | INTRAVENOUS | Status: DC | PRN

## 2017-03-06 MED ORDER — FENTANYL CITRATE (PF) 100 MCG/2ML IJ SOLN: 25.0000 ug | INTRAMUSCULAR | Status: DC | PRN

## 2017-03-06 MED ORDER — ONDANSETRON HCL 4 MG/2ML IJ SOLN: 4.0000 mg | Freq: Four times a day (QID) | INTRAMUSCULAR | Status: DC | PRN

## 2017-03-06 MED ORDER — ONDANSETRON HCL 4 MG/2ML IJ SOLN
INTRAMUSCULAR | Status: DC | PRN
  Administered 2017-03-06: 4 mg via INTRAVENOUS

## 2017-03-06 MED ORDER — ACETAMINOPHEN 325 MG PO TABS: 650.0000 mg | ORAL_TABLET | ORAL | Status: DC | PRN

## 2017-03-06 MED ORDER — BUPIVACAINE HCL (PF) 0.25 % IJ SOLN
INTRAMUSCULAR | Status: DC | PRN
  Administered 2017-03-06: 20 mL

## 2017-03-06 MED ORDER — OXYCODONE HCL 5 MG/5ML PO SOLN: 5.0000 mg | Freq: Once | ORAL | Status: DC | PRN

## 2017-03-06 MED ORDER — PHENYLEPHRINE HCL 10 MG/ML IJ SOLN
INTRAMUSCULAR | Status: DC | PRN
  Administered 2017-03-06: 75 ug/min via INTRAVENOUS

## 2017-03-06 MED ORDER — SODIUM CHLORIDE 0.9 % IV SOLN: INTRAVENOUS | Status: DC

## 2017-03-06 MED ORDER — PHENYLEPHRINE HCL 10 MG/ML IJ SOLN
INTRAMUSCULAR | Status: DC | PRN
  Administered 2017-03-06 (×4): 80 ug via INTRAVENOUS

## 2017-03-06 MED ORDER — PROPOFOL 10 MG/ML IV BOLUS
INTRAVENOUS | Status: DC | PRN
  Administered 2017-03-06: 120 mg via INTRAVENOUS

## 2017-03-06 MED ORDER — SUCCINYLCHOLINE CHLORIDE 20 MG/ML IJ SOLN
INTRAMUSCULAR | Status: DC | PRN
  Administered 2017-03-06: 100 mg via INTRAVENOUS

## 2017-03-06 MED ORDER — BUPIVACAINE HCL (PF) 0.25 % IJ SOLN
INTRAMUSCULAR | Status: AC
  Filled 2017-03-06: qty 30

## 2017-03-06 MED ORDER — SODIUM CHLORIDE 0.9 % IV SOLN
INTRAVENOUS | Status: DC
  Administered 2017-03-06: 12:00:00 via INTRAVENOUS

## 2017-03-06 MED ORDER — MIDAZOLAM HCL 2 MG/2ML IJ SOLN
INTRAMUSCULAR | Status: AC
  Filled 2017-03-06: qty 2

## 2017-03-06 SURGICAL SUPPLY — 9 items
BLANKET WARM UNDERBOD FULL ACC (MISCELLANEOUS) ×3
CATH EZ STEER NAV 8MM F-J CUR (ABLATOR) ×3
CATH WEBSTER BI DIR CS D-F CRV (CATHETERS) ×3
PACK EP LATEX FREE (CUSTOM PROCEDURE TRAY) ×3
PACK EP LF (CUSTOM PROCEDURE TRAY) ×1
PAD DEFIB LIFELINK (PAD) ×3
PATCH CARTO3 (PAD) ×3
SHEATH PINNACLE 7F 10CM (SHEATH) ×3
SHEATH PINNACLE 8F 10CM (SHEATH) ×3

## 2017-03-06 NOTE — Progress Notes (Signed)
  Echocardiogram Echocardiogram Transesophageal has been performed.  Jay [name] 03/11/2017, 1:20 PM

## 2017-03-06 NOTE — Progress Notes (Signed)
While patient was sleeping O2 sats drop to 60's% on 4 l/m [place] Mouth breathing. Was call to me by  [facility] about this . Patient uses CPAP at home sometimes and has an order for it here. Resp. Ther. Called and coming to put patient on CPAP per previous orders. Breathing Tx. was given By Resp. Ther. And CPAP on at 10 liters. Sats now ranging from 90's to 100 % [place] R.N. Aware

## 2017-03-06 NOTE — Anesthesia Procedure Notes (Addendum)
Procedure Name: Intubation Date/Time: 03/01/2017 12:03 PM Performed by: Axle Parfait T, CRNA Pre-anesthesia Checklist: Patient identified, Emergency Drugs available, Suction available, Patient being monitored and Timeout performed Patient Re-evaluated:Patient Re-evaluated prior to induction Oxygen Delivery Method: Circle system utilized Preoxygenation: Pre-oxygenation with 100% oxygen Induction Type: IV induction, Inhalational induction, Rapid sequence and Cricoid Pressure applied Ventilation: Mask ventilation without difficulty Laryngoscope Size: Mac and 4 Grade View: Grade I Tube type: Oral Tube size: 7.5 mm Number of attempts: 1 Placement Confirmation: ETT inserted through vocal cords under direct vision,  positive ETCO2 and breath sounds checked- equal and bilateral Secured at: 22 cm Tube secured with: Tape Dental Injury: Teeth and Oropharynx as per pre-operative assessment

## 2017-03-06 NOTE — Anesthesia Postprocedure Evaluation (Signed)
Anesthesia Post Note  Patient: Jay [name]  Procedure(s) Performed: A-FLUTTER ABLATION (N/A )     Patient location during evaluation: PACU Anesthesia Type: General Level of consciousness: awake and alert Pain management: pain level controlled Vital Signs Assessment: post-procedure vital signs reviewed and stable Respiratory status: spontaneous breathing, nonlabored ventilation, respiratory function stable and patient connected to nasal cannula oxygen Cardiovascular status: blood pressure returned to baseline and stable Postop Assessment: no apparent nausea or vomiting Anesthetic complications: no    Last Vitals:  Vitals:   03/22/2017 1400 03/25/2017 1415  BP:  123/61  Pulse:  96  Resp:  (!) 26  Temp: (!) 36.3 C   SpO2:  91%    Last Pain:  Vitals:   03/16/2017 0815  TempSrc:   PainSc: 0-No pain                 Catalina Gravel

## 2017-03-06 NOTE — CV Procedure (Signed)
    TEE  Time out performed  Anesthesia - propofol  Ind: AFLUTTER  Findings  - EF 30-35%  - mild mr/tr  - no LA or LAA thombus  OK to proceed with flutter ablation  Candee Furbish, MD

## 2017-03-06 NOTE — Progress Notes (Signed)
Progress Note  Patient Name: Jay [name] Date of Encounter: 03/10/2017  Primary Cardiologist: Dr. Sallyanne Kuster  Subjective   NO CP, tired this AM, remains SOB  Inpatient Medications    Scheduled Meds: . allopurinol  300 mg Oral Daily  . apixaban  5 mg Oral BID  . atorvastatin  20 mg Oral Daily  . budesonide (PULMICORT) nebulizer solution  0.5 mg Nebulization BID  . diltiazem  60 mg Oral Q6H  . fesoterodine  4 mg Oral Daily  . furosemide  40 mg Intravenous Once  . Living Better with Heart Failure Book   Does not apply Once  . loratadine  10 mg Oral Daily  . mouth rinse  15 mL Mouth Rinse BID  . metoprolol tartrate  25 mg Oral BID  . pantoprazole  40 mg Oral Daily  . potassium chloride  20 mEq Oral BID  . tiotropium  18 mcg Inhalation Daily  . torsemide  20 mg Oral BID   Continuous Infusions:  PRN Meds: acetaminophen, albuterol   Vital Signs    Vitals:   03/18/2017 0013 03/16/2017 0520 03/24/2017 0524 03/05/2017 0701  BP: 100/72 108/62    Pulse: 87 (!) 43 66 66  Resp: (!) 28 (!) 24 (!) 26 20  Temp: 97.9 F (36.6 C)  98.9 F (37.2 C)   TempSrc: Oral  Oral   SpO2: 93% 96% 90% 96%  Weight:    243 lb (110.2 kg)  Height:        Intake/Output Summary (Last 24 hours) at 03/17/2017 0904 Last data filed at 03/15/2017 0510 Gross per 24 hour  Intake 510 ml  Output 1200 ml  Net -690 ml   Filed Weights   03/04/17 0429 03/05/17 0400 03/11/2017 0701  Weight: 245 lb 6 oz (111.3 kg) 243 lb 12.8 oz (110.6 kg) 243 lb (110.2 kg)    Telemetry    AFlutter 70's-90's - Personally Reviewed  ECG     no new EKGs- Personally Reviewed  Physical Exam   GEN: Remains SOB, though in no acute distress.   Neck: No JVD Cardiac: iRRR, no murmurs, rubs, or gallops.  Respiratory: diminished throughout, no wheezes appreciated today, soft crackles Left base. GI: Soft, nontender, non-distended  MS: 1+ edema R, trace left; No deformity. Neuro:  Nonfocal  Psych: Normal affect   Labs      Chemistry Recent Labs  Lab 03/01/17 0343  03/04/17 0304 03/05/17 0302 03/20/2017 0216  NA 138   < > 132* 134* 135  K 3.8   < > 3.8 4.0 3.9  CL 95*   < > 87* 89* 91*  CO2 34*   < > 34* 36* 34*  GLUCOSE 138*   < > 119* 133* 130*  BUN 15   < > 25* 31* 33*  CREATININE 1.12   < > 1.26* 1.27* 1.30*  CALCIUM 9.2   < > 8.8* 9.0 8.8*  PROT 6.3*  --   --   --   --   ALBUMIN 3.2*  --   --   --   --   AST 24  --   --   --   --   ALT 50  --   --   --   --   ALKPHOS 79  --   --   --   --   BILITOT 0.8  --   --   --   --   GFRNONAA >60   < > 59*  58* 56*  GFRAA >60   < > >60 >60 >60  ANIONGAP 9   < > 11 9 10    < > = values in this interval not displayed.     Hematology Recent Labs  Lab 03/04/17 0304 03/05/17 0302 03/05/2017 0216  WBC 9.7 10.4 11.5*  RBC 3.28* 3.41* 3.17*  HGB 9.7* 9.9* 9.4*  HCT 30.7* 31.5* 29.7*  MCV 93.6 92.4 93.7  MCH 29.6 29.0 29.7  MCHC 31.6 31.4 31.6  RDW 14.9 14.7 14.6  PLT 367 399 398    Cardiac Enzymes Recent Labs  Lab 03/09/2017 2248 03/01/17 0343 03/01/17 1613  TROPONINI 0.05* 0.05* 0.05*    Recent Labs  Lab 03/14/2017 1040  TROPIPOC 0.02     BNP Recent Labs  Lab 03/24/2017 1028  BNP 367.1*     DDimer No results for input(s): DDIMER in the last 168 hours.   Radiology    No results found.  Cardiac Studies   2D Echo 01/15/17 Study Conclusions - Left ventricle: The cavity size was moderately dilated. Wall thickness was increased in a pattern of mild LVH. Systolic function was mildly to moderately reduced. The estimated ejection fraction was in the range of 40% to 45%. Diffuse hypokinesis. Doppler parameters are consistent with abnormal left ventricular relaxation (grade 1 diastolic dysfunction). Doppler parameters are consistent with high ventricular filling pressure. - Aortic valve: There was trivial regurgitation. - Left atrium: The atrium was mildly dilated. (52mm) - Right ventricle: The cavity size was mildly  dilated. - Right atrium: The atrium was mildly dilated. Impressions: - Mild to moderate global reduction in LV systolic function; mild diastolic dysfunction with elevated LV fillin pressure; moderate LVE; mild LVH; mild biatrial enlargement; mild RVE.    Patient Profile     66 y.o. male with a hx of CAD/CABG 2007, cath 2008 2/4 were occluded, last ischemic evaluation by stress ini 2016 noted scar only, no ischemia, chronic CHF (ischemic/nonischemic) last EF 40-45%, known PVCs, (saw Dr. Lovena Le in consult 2016 for syncope,  NSVT and PVCs), HTN, severe COPD (has home O2, rarely uses), OSA w/CPAP, anemia with dx of chron's colitis, Sept 2017 required transfusion, admitted with CHF exacerbation and AFlutter  Assessment & Plan    1. SOB, CHF exacerbation 2. CM (mixed)     SOB remains unchanged despite diuresis, remains edematous, though a little less this AM     C/w primary cardiology team  Weight likely inaccurate Fluid negative cumulatively 7,835ml  Will give IV lasix this AM, remains SOB, likely is multifactorial, though patient feels strongly arrhythmia is primary source of his SOB  3. AFlutter, onset weeks ago     Appears typical     CHA2DS2Vasc is at least 3, not on a/c 2/2 hx of GIB (pt declined)     D/w patient need for at least short term a/c for ablation, he is agreeable, understands rational discussed, he denies any GI issues since January of this year.     Rate appears fairly well controlled currently on lopressor 25mg  BID/diltiazem 60mg  q6     Eliquis started yesterday  Will plan for TEE/EPS w/ablation today, risks and benefits of both procedures were discussed with the patient today, he is aware of plans for anesthesia and is agreeable to proceed.    4. HTN     Looks OK with current regime    For questions or updates, please contact Ila Please consult [email or web address] for contact info under Cardiology/STEMI.  Signed, Baldwin [place], PA-C   03/14/2017, 9:04 AM

## 2017-03-06 NOTE — Interval H&P Note (Signed)
History and Physical Interval Note:  03/20/2017 10:53 AM  Jay [name]  has presented today for surgery, with the diagnosis of aflutter  The various methods of treatment have been discussed with the patient and family. After consideration of risks, benefits and other options for treatment, the patient has consented to  Procedure(s): A-FLUTTER ABLATION (N/A) as a surgical intervention .  The patient's history has been reviewed, patient examined, no change in status, stable for surgery.  I have reviewed the patient's chart and labs.  Questions were answered to the patient's satisfaction.     Thompson Grayer

## 2017-03-06 NOTE — Progress Notes (Signed)
Site area:Right femoral vein  Site Prior to Removal: level 0  Pressure Applied For  20 min  Minutes Beginning at 1313  Manual: yes, [place] [facility]  Patient Status During Pull: intubated, sedated  Post Pull Groin Site: level 0  Post Pull Instructions Given:  yes, will need reinforcement.  Dressing Applied: Gauze, tegaderm

## 2017-03-06 NOTE — Transfer of Care (Signed)
Immediate Anesthesia Transfer of Care Note  Patient: [name]  Procedure(s) Performed: A-FLUTTER ABLATION (N/A )  Patient Location: PACU  Anesthesia Type:General  Level of Consciousness: awake and alert   Airway & Oxygen Therapy: Patient Spontanous Breathing and Patient connected to face mask oxygen  Post-op Assessment: Report given to RN and Post -op Vital signs reviewed and stable  Post vital signs: Reviewed and stable  Last Vitals:  Vitals:   03/10/2017 0941 03/05/2017 1005  BP:  (!) 131/56  Pulse:  93  Resp:    Temp:    SpO2: 92%     Last Pain:  Vitals:   03/04/2017 0815  TempSrc:   PainSc: 0-No pain      Patients Stated Pain Goal: 2 (00/51/10 2111)  Complications: No apparent anesthesia complications

## 2017-03-06 NOTE — Progress Notes (Signed)
Family Medicine Teaching Service Daily Progress Note Intern Pager: [phone]  Patient name: Jay [name] Medical record number: [id number] Date of birth: 03-09-1951 Age: 66 y.o. Gender: male  Primary Care Provider: Cathlean Sauer, MD Consultants: cards Code Status: DNR  Pt Overview and Major Events to Date:  Jay [name] is a 66 y.o. male presenting with acute hypoxic respiratory failure. PMH is significant for psoriatic arthritis, COPD, Crohn's disease, atrial fibrillation (no anticoagulation), status post CABG x4, OSA on CPAP, chronic combined congestive heart failure   Assessment and Plan: Jay [name] is a 66 y.o. male presenting with acute hypoxic respiratory failure. PMH is significant for psoriatic arthritis, COPD, Crohn's disease, atrial fibrillation (no anticoagulation), status post CABG x4, OSA on CPAP, chronic combined congestive heart failure   Combined CHF exacerbation with acute hypoxic respiratory failure: unchanged status overnight EF 40-45% with G1DD (9/18). Dry weight=234 lbs. Exacerbation is most likely secondary to the persistent tachycardia for the past month. Minimally elevated trop (0.05) thought to be secondary to demand ischemia. Requiring 3-4L O2 by [place].  11/7 Morning weights/and overnight output unreliable due to charting errors but prior status was Weight is down 9lbs (254>243 lbs) and UOP 7.3L.  -telemetry  -torsemide 20mg  BID -Continuous pulse ox and supplemental O2 prn  -Cardiology consulted; appreciate recs  -metoprolol 25 bid -Strict I's and O's -Daily weights  AFib: Currently rate controlled. Per chart review, patient has been having recurrent A. fib with RVR and is not receiving any anticoagulation due to his Crohn's disease.  -metoprolol 25mg  BID -dilt 60mg  PO q6 -Cardiology consulted -EP consulted, PA would like to discuss with doc but patient agrees to anticoagulation for ablation -will call pallitaive if EP doc does not propose  intervention  COPD: 2L by [place] at night and prn at home. No evidence of exacerbation. Former smoker. Patient refuses cpap -Continuous pulse ox -change Dulera 2 puffs BID to flovent 220 2 puffs BID for concern of HR -albuterol q4prn nebs -home Spiriva daily -Titrate oxygen to >90% O2  Normocytic Anemia-Hgb 9.4 down from baseline ~10.5-11.5.  Hx of bleeding and not getting IVF currently so dilution not likely, fobt neg -will discuss with team and evaluate for possible causes as no known bleed currently.  Iron panel significant for low iron (25) and elevated ferritin (1,051) and saturation rations low (8%)  Hyponatremia- resolved  Crohn's disease Stable  CAD with history of CABG x4 No chest pain. EKG showing atrial flutter but no signs of ischemia.   OSA -CPAP at night  Hx of prostate cancer w/ prostectomy-  -home fesoterodine for incontinence  FEN/GI: low sodium diet/protonix Prophylaxis: subcutaneous hep  Disposition: Pending EP consult, I called the cards fellow overnight to try and ensure EP would drop note.  Subjective:  Patient is frustrated with not having an answer and still having SOB.  He notes edema in feet is worse than normal and per him, not decreaign despite 7L urine output  Objective: Temp:  [97.8 F (36.6 C)-99 F (37.2 C)] 98.9 F (37.2 C) (11/07 0524) Pulse Rate:  [25-95] 66 (11/07 0701) Resp:  [19-28] 20 (11/07 0701) BP: (100-117)/(62-77) 108/62 (11/07 0520) SpO2:  [66 %-98 %] 96 % (11/07 0701) FiO2 (%):  [4 %] 4 % (11/07 0013) Weight:  [243 lb (110.2 kg)] 243 lb (110.2 kg) (11/07 0701) Physical Exam: General: 66 year old man sleeping in chair, uncomfortable Cardiovascular: Tachycardic and irregularly irregular, no apparent murmurs Respiratory: increased WOB, crackles in the bilateral bases with diminished  breath sounds Extremities: 1+ pitting edema to the knees bilaterally, specifically feet are 2+ Neuro: Alert and oriented x3, no gross  neurological deficits Psych: Pleasant, seems somewhat hopeless about health recovery, frustrated with not seeing a recovery in his future  Laboratory: Recent Labs  Lab 03/04/17 0304 03/05/17 0302 03/29/2017 0216  WBC 9.7 10.4 11.5*  HGB 9.7* 9.9* 9.4*  HCT 30.7* 31.5* 29.7*  PLT 367 399 398   Recent Labs  Lab 03/01/17 0343  03/04/17 0304 03/05/17 0302 03/05/2017 0216  NA 138   < > 132* 134* 135  K 3.8   < > 3.8 4.0 3.9  CL 95*   < > 87* 89* 91*  CO2 34*   < > 34* 36* 34*  BUN 15   < > 25* 31* 33*  CREATININE 1.12   < > 1.26* 1.27* 1.30*  CALCIUM 9.2   < > 8.8* 9.0 8.8*  PROT 6.3*  --   --   --   --   BILITOT 0.8  --   --   --   --   ALKPHOS 79  --   --   --   --   ALT 50  --   --   --   --   AST 24  --   --   --   --   GLUCOSE 138*   < > 119* 133* 130*   < > = values in this interval not displayed.   Imaging/Diagnostic Tests: Dg Chest 2 View  Result Date: 03/19/2017 CLINICAL DATA:  Shortness of breath, CHF, worse over the last month EXAM: CHEST  2 VIEW COMPARISON:  Chest x-ray of 02/20/2017 FINDINGS: There is cardiomegaly present with some fullness of the perihilar vasculature possibly indicating mild pulmonary vascular congestion, without definite congestive heart failure. Tiny pleural effusions cannot be excluded with some blunting of the posterior costophrenic angles. No pneumonia is seen. Median sternotomy sutures are present from prior CABG. No acute bony abnormality is seen. IMPRESSION: Cardiomegaly.  Probable mild pulmonary vascular congestion. Electronically Signed   By: Ivar Drape M.D.   On: 03/17/2017 11:07     Sherene Sires, DO 03/12/2017, 7:08 AM PGY-1, [place] Intern pager: [phone], text pages welcome

## 2017-03-06 NOTE — Anesthesia Preprocedure Evaluation (Addendum)
Anesthesia Evaluation  Patient identified by MRN, date of birth, ID band Patient awake    Reviewed: Allergy & Precautions, H&P , NPO status , Patient's Chart, lab work & pertinent test results, reviewed documented beta blocker date and time   Airway Mallampati: II   Neck ROM: full    Dental  (+) Teeth Intact Bottom permanent bridge:   Pulmonary sleep apnea, Continuous Positive Airway Pressure Ventilation and Oxygen sleep apnea , COPD,  COPD inhaler and oxygen dependent, former smoker,    breath sounds clear to auscultation       Cardiovascular hypertension, Pt. on home beta blockers and Pt. on medications + CAD, + Past MI, + CABG and +CHF  + dysrhythmias Atrial Fibrillation  Rhythm:irregular Rate:Normal  Echo 01/15/17: Study Conclusions  - Left ventricle: The cavity size was moderately dilated. Wall   thickness was increased in a pattern of mild LVH. Systolic   function was mildly to moderately reduced. The estimated ejection   fraction was in the range of 40% to 45%. Diffuse hypokinesis.   Doppler parameters are consistent with abnormal left ventricular   relaxation (grade 1 diastolic dysfunction). Doppler parameters   are consistent with high ventricular filling pressure. - Aortic valve: There was trivial regurgitation. - Left atrium: The atrium was mildly dilated. - Right ventricle: The cavity size was mildly dilated. - Right atrium: The atrium was mildly dilated.    Neuro/Psych negative neurological ROS  negative psych ROS   GI/Hepatic GERD  Medicated,  Endo/Other  obese  Renal/GU Renal InsufficiencyRenal disease     Musculoskeletal  (+) Arthritis ,   Abdominal   Peds  Hematology  (+) Blood dyscrasia, anemia ,   Anesthesia Other Findings   Reproductive/Obstetrics                           Anesthesia Physical Anesthesia Plan  ASA: III  Anesthesia Plan: General   Post-op Pain  Management:    Induction: Intravenous  PONV Risk Score and Plan: 2 and Ondansetron, Treatment may vary due to age or medical condition and Dexamethasone  Airway Management Planned: Simple Face Mask  Additional Equipment:   Intra-op Plan:   Post-operative Plan:   Informed Consent: I have reviewed the patients History and Physical, chart, labs and discussed the procedure including the risks, benefits and alternatives for the proposed anesthesia with the patient or authorized representative who has indicated his/her understanding and acceptance.   Dental advisory given  Plan Discussed with: CRNA, Anesthesiologist and Surgeon  Anesthesia Plan Comments:        Anesthesia Quick Evaluation

## 2017-03-06 NOTE — Progress Notes (Signed)
PT Cancellation Note  Patient Details Name: PAULANTHONY GLEAVES MRN: [id number] DOB: 03-13-1951   Cancelled Treatment:    Reason Eval/Treat Not Completed: Medical issues which prohibited therapy(Pt just returned from ablation and on bed rest until 1930.)  Janna Arch, SPTA  Janna Arch 03/05/2017, 4:02 PM

## 2017-03-06 NOTE — Progress Notes (Signed)
S/p successful CTI ablation for typical atrial flutter.   Respiratory status is tenuous. CVP noted to be quite high during the procedure. EF reduced by TEE.  Would advise aggressive diuresis over the next 2-3 days, as renal function allows. Stop diltiazem/ metoprolol and start coreg 6.25mg  BID Consider ACE inhibitor/ ARB if renal function remains stable.  Continue uninterrupted eliquis x 4 weeks.  Call Dr Rayann Heman with any cardiac/ EP concerns over the next 24 hours.  Thompson Grayer MD, [facility] 03/25/2017 1:28 PM

## 2017-03-06 NOTE — Progress Notes (Addendum)
RT called for pt sats in the rnand Patient stated that Pt has an order for CPAP. Placed pt on Dream station of BI-LEVEL setting of 18/5 and 6L bled in.

## 2017-03-07 ENCOUNTER — Encounter (HOSPITAL_COMMUNITY): Admission: EM | Disposition: E | Payer: Self-pay | Source: Home / Self Care | Attending: Family Medicine

## 2017-03-07 ENCOUNTER — Encounter (HOSPITAL_COMMUNITY): Payer: Self-pay | Admitting: Internal Medicine

## 2017-03-07 LAB — BASIC METABOLIC PANEL
ANION GAP: 9 (ref 5–15)
BUN: 37 mg/dL — ABNORMAL HIGH (ref 6–20)
CALCIUM: 8.9 mg/dL (ref 8.9–10.3)
CO2: 34 mmol/L — ABNORMAL HIGH (ref 22–32)
Chloride: 92 mmol/L — ABNORMAL LOW (ref 101–111)
Creatinine, Ser: 1.37 mg/dL — ABNORMAL HIGH (ref 0.61–1.24)
GFR calc Af Amer: >60 mL/min (ref >60)
GFR, EST NON AFRICAN AMERICAN: 53 mL/min — AB (ref >60)
Glucose, Bld: 108 mg/dL — ABNORMAL HIGH (ref 65–99)
POTASSIUM: 4.4 mmol/L (ref 3.5–5.1)
SODIUM: 135 mmol/L (ref 135–145)

## 2017-03-07 LAB — CBC
HEMATOCRIT: 29.3 % — AB (ref 39.0–52.0)
HEMOGLOBIN: 9.2 g/dL — AB (ref 13.0–17.0)
MCH: 30 pg (ref 26.0–34.0)
MCHC: 31.4 g/dL (ref 30.0–36.0)
MCV: 95.4 fL (ref 78.0–100.0)
Platelets: 373 10*3/uL (ref 150–400)
RBC: 3.07 MIL/uL — ABNORMAL LOW (ref 4.22–5.81)
RDW: 15.6 % — AB (ref 11.5–15.5)
WBC: 9.8 10*3/uL (ref 4.0–10.5)

## 2017-03-07 SURGERY — ECHOCARDIOGRAM, TRANSESOPHAGEAL
Anesthesia: Moderate Sedation

## 2017-03-07 MED ORDER — LISINOPRIL 10 MG PO TABS
10.0000 mg | ORAL_TABLET | Freq: Every day | ORAL | Status: DC
  Administered 2017-03-07: 10 mg via ORAL
  Filled 2017-03-07: qty 1

## 2017-03-07 NOTE — Discharge Instructions (Signed)
Post procedure care instructions No driving for 4 days. No lifting over 5 lbs for 1 week. No vigorous or sexual activity for 1 week. You may return to work on 03/13/17. Keep procedure site clean & dry. If you notice increased pain, swelling, bleeding or pus, call/return!  You may shower, but no soaking baths/hot tubs/pools for 1 week.   ===========================================================================================  Information on my medicine - ELIQUIS (apixaban)  This medication education was reviewed with me or my healthcare representative as part of my discharge preparation.  The pharmacist that spoke with me during my hospital stay was:  Arty Baumgartner, [facility]  Why was Eliquis prescribed for you? Eliquis was prescribed for you to reduce the risk of a blood clot forming that can cause a stroke if you have a medical condition called atrial fibrillation (a type of irregular heartbeat).  What do You need to know about Eliquis ? Take your Eliquis TWICE DAILY - one tablet in the morning and one tablet in the evening with or without food. If you have difficulty swallowing the tablet whole please discuss with your pharmacist how to take the medication safely.  Take Eliquis exactly as prescribed by your doctor and DO NOT stop taking Eliquis without talking to the doctor who prescribed the medication.  Stopping may increase your risk of developing a stroke.  Refill your prescription before you run out.  After discharge, you should have regular check-up appointments with your healthcare provider that is prescribing your Eliquis.  In the future your dose may need to be changed if your kidney function or weight changes by a significant amount or as you get older.  What do you do if you miss a dose? If you miss a dose, take it as soon as you remember on the same day and resume taking twice daily.  Do not take more than one dose of ELIQUIS at the same time to make up a missed  dose.  Important Safety Information A possible side effect of Eliquis is bleeding. You should call your healthcare provider right away if you experience any of the following: ? Bleeding from an injury or your nose that does not stop. ? Unusual colored urine (red or dark brown) or unusual colored stools (red or black). ? Unusual bruising for unknown reasons. ? A serious fall or if you hit your head (even if there is no bleeding).  Some medicines may interact with Eliquis and might increase your risk of bleeding or clotting while on Eliquis. To help avoid this, consult your healthcare provider or pharmacist prior to using any new prescription or non-prescription medications, including herbals, vitamins, non-steroidal anti-inflammatory drugs (NSAIDs) and supplements.  This website has more information on Eliquis (apixaban): http://www.eliquis.com/eliquis/home

## 2017-03-07 NOTE — Progress Notes (Signed)
Physical Therapy Treatment Patient Details Name: Jay [name] MRN: [id number] DOB: [birth year] Today's Date: 03/19/2017    History of Present Illness Jay [name] is a 66 y.o. male presenting on 03/25/2017 with CHF exacerbation with acute hypoxic respiratory failure. Per chart review, patient has been having recurrent A. fib with RVR and is not receiving any anticoagulation due to his Crohn's disease. PMH is significant for psoriatic arthritis, COPD, Crohn's disease, a-fib (no anticoagulation), status post CABG x4, OSA on CPAP, chronic combined CHF, obesity.     PT Comments    Pt needing 4L O2 for amb to maintain sats > 90%, but overall mobilizing well and demonstrated adequate distance for necessary home ambulation.     Follow Up Recommendations  No PT follow up;Supervision - Intermittent     Equipment Recommendations  None recommended by PT    Recommendations for Other Services       Precautions / Restrictions Precautions Precautions: None Restrictions Weight Bearing Restrictions: No    Mobility  Bed Mobility               General bed mobility comments: Received sitting in chair; pt reports indep with bed mob  Transfers Overall transfer level: Independent Equipment used: None Transfers: Sit to/from Stand Sit to Stand: Independent            Ambulation/Gait Ambulation/Gait assistance: Supervision Ambulation Distance (Feet): 60 Feet Assistive device: None(held onto O2 tank) Gait Pattern/deviations: Step-through pattern;Antalgic     General Gait Details: pt agreeable to amb furthest distance necessary for home ambulation; O2 sats on 4LO2 remained > 93%   Stairs            Wheelchair Mobility    Modified Rankin (Stroke Patients Only)       Balance                                            Cognition Arousal/Alertness: Awake/alert Behavior During Therapy: WFL for tasks assessed/performed Overall Cognitive Status:  Within Functional Limits for tasks assessed                                        Exercises      General Comments General comments (skin integrity, edema, etc.): reinforced regular exercise and activity at d/c; pt declining additional services at this time      Pertinent Vitals/Pain Pain Assessment: No/denies pain    Home Living                      Prior Function            PT Goals (current goals can now be found in the care plan section) Acute Rehab PT Goals Patient Stated Goal: Return home PT Goal Formulation: With patient Time For Goal Achievement: 03/19/17 Potential to Achieve Goals: Good Progress towards PT goals: Progressing toward goals    Frequency    Min 3X/week      PT Plan Current plan remains appropriate    Co-evaluation              AM-PAC PT "6 Clicks" Daily Activity  Outcome Measure  Difficulty turning over in bed (including adjusting bedclothes, sheets and blankets)?: None Difficulty moving from lying on back to sitting on the side  of the bed? : None Difficulty sitting down on and standing up from a chair with arms (e.g., wheelchair, bedside commode, etc,.)?: None Help needed moving to and from a bed to chair (including a wheelchair)?: None Help needed walking in hospital room?: A Little Help needed climbing 3-5 steps with a railing? : A Little 6 Click Score: 22    End of Session Equipment Utilized During Treatment: Gait belt;Oxygen Activity Tolerance: Patient tolerated treatment well Patient left: in chair;with call bell/phone within reach Nurse Communication: Mobility status PT Visit Diagnosis: Other abnormalities of gait and mobility (R26.89)     Time: 1324-4010 PT Time Calculation (min) (ACUTE ONLY): 23 min  Charges:  $Gait Training: 8-22 mins $Therapeutic Activity: 8-22 mins                    G Codes:       Laureen Abrahams, PT, DPT [phone]    Faustino Congress K 03/01/2017, 3:11  PM

## 2017-03-07 NOTE — Care Management Important Message (Signed)
Important Message  Patient Details  Name: Jay [name] MRN: [id number] Date of Birth: Jul 26, 1950   Medicare Important Message Given:  Yes    Nathen May 03/23/2017, 12:56 PM

## 2017-03-07 NOTE — Progress Notes (Signed)
Family Medicine Teaching Service Daily Progress Note Intern Pager: [phone]  Patient name: Jay [name] Medical record number: [id number] Date of birth: 09-06-1950 Age: 66 y.o. Gender: male  Primary Care Provider: Cathlean Sauer, MD Consultants: cards Code Status: DNR  Pt Overview and Major Events to Date:  Jay [name] is a 66 y.o. male presenting with acute hypoxic respiratory failure. PMH is significant for psoriatic arthritis, COPD, Crohn's disease, atrial fibrillation (no anticoagulation), status post CABG x4, OSA on CPAP, chronic combined congestive heart failure   Assessment and Plan: Jay [name] is a 66 y.o. male presenting with acute hypoxic respiratory failure. PMH is significant for psoriatic arthritis, COPD, Crohn's disease, atrial fibrillation (no anticoagulation), status post CABG x4, OSA on CPAP, chronic combined congestive heart failure   Combined CHF exacerbation with acute hypoxic respiratory failure: unchanged status overnight EF 30-35% with G1DD (9/18). Dry weight=234 lbs. Needed cpap overnight.  Requiring 3-4L O2 by [place].  Questionable weights/I/O (down 9lbs (254>245 lbs) and UOP 8.1L.)  -telemetry  -IV lasix 80 BID -Continuous pulse ox and supplemental O2 prn  -Cardiology consulted; appreciate recs  -carvedilol 6.25 -Strict I's and O's -Daily weights  AFib: s/p ablation in NSR, had tachycardia into 100s overnght. Will receive 4wks anticoagulation due to his Crohn's disease.  -carvedilol 6.25 -Cardiology consulted  COPD: 2L by [place] at night and prn at home. No evidence of exacerbation. Former smoker. Patient generally refuses cpap (but needed overnight) -Continuous pulse ox -change Dulera 2 puffs BID to flovent 220 2 puffs BID for concern of HR -albuterol q4prn nebs (not requesting) -home Spiriva daily -Titrate oxygen to >90% O2  Normocytic Anemia-Morning labs pending.  Hgb 9.2 down from baseline ~10.5-11.5.  Hx of bleeding and not getting IVF  currently so dilution not likely, fobt neg -Threshold for transfusion closer to 8.  Iron panel significant for low iron (25) and elevated ferritin (1,051) and saturation rations low (8%)  Hyponatremia- resolved  Crohn's disease Stable  CAD with history of CABG x4 No chest pain. EKG showing atrial flutter but no signs of ischemia.   OSA -CPAP at night  Hx of prostate cancer w/ prostectomy-  -home fesoterodine for incontinence  FEN/GI: low sodium diet/protonix Prophylaxis: subcutaneous hep  Disposition: Pending cards/EP clearance, likely more diuresis  Subjective:  Stated he felt considerably better since the ablation.  Complained about being pressured to wear cpap overnight without being taught how to take it on and off.  Felt he was also benig denied access to his incontinence underwear.   We restated he has a right to be taught about equipment he is using and to access his items.  Objective: Temp:  [97.4 F (36.3 C)-98.7 F (37.1 C)] 98.4 F (36.9 C) (11/08 0530) Pulse Rate:  [66-110] 91 (11/08 0530) Resp:  [18-30] 21 (11/08 0530) BP: (89-131)/(42-73) 115/70 (11/08 0530) SpO2:  [87 %-97 %] 95 % (11/08 0530) Weight:  [243 lb (110.2 kg)-245 lb 9.6 oz (111.4 kg)] 245 lb 9.6 oz (111.4 kg) (11/08 0210) Physical Exam: General: 66 year old man sleeping in chair, uncomfortable Cardiovascular: RRR, no apparent murmurs Respiratory: increased WOB, crackles in the bilateral bases with diminished breath sounds Extremities: 1+ edema to the knees bilaterally, specifically feet are 2+ Neuro:  no gross neurological deficits Psych: Pleasant, appropriate  Laboratory: Recent Labs  Lab 03/04/17 0304 03/05/17 0302 03/01/2017 0216  WBC 9.7 10.4 11.5*  HGB 9.7* 9.9* 9.4*  HCT 30.7* 31.5* 29.7*  PLT 367 399 398   Recent Labs  Lab 03/01/17 0343  03/04/17 0304 03/05/17 0302 03/21/2017 0216  NA 138   < > 132* 134* 135  K 3.8   < > 3.8 4.0 3.9  CL 95*   < > 87* 89* 91*  CO2 34*    < > 34* 36* 34*  BUN 15   < > 25* 31* 33*  CREATININE 1.12   < > 1.26* 1.27* 1.30*  CALCIUM 9.2   < > 8.8* 9.0 8.8*  PROT 6.3*  --   --   --   --   BILITOT 0.8  --   --   --   --   ALKPHOS 79  --   --   --   --   ALT 50  --   --   --   --   AST 24  --   --   --   --   GLUCOSE 138*   < > 119* 133* 130*   < > = values in this interval not displayed.   Imaging/Diagnostic Tests: Dg Chest 2 View  Result Date: 03/16/2017 CLINICAL DATA:  Shortness of breath, CHF, worse over the last month EXAM: CHEST  2 VIEW COMPARISON:  Chest x-ray of 02/20/2017 FINDINGS: There is cardiomegaly present with some fullness of the perihilar vasculature possibly indicating mild pulmonary vascular congestion, without definite congestive heart failure. Tiny pleural effusions cannot be excluded with some blunting of the posterior costophrenic angles. No pneumonia is seen. Median sternotomy sutures are present from prior CABG. No acute bony abnormality is seen. IMPRESSION: Cardiomegaly.  Probable mild pulmonary vascular congestion. Electronically Signed   By: Ivar Drape M.D.   On: 03/10/2017 11:07     Sherene Sires, DO 03/25/2017, 6:58 AM PGY-1, [place] Intern pager: [phone], text pages welcome

## 2017-03-07 NOTE — Progress Notes (Signed)
Per insurance check for Eliquis   # 5. S/W CHRISTOPHER @ [place] [facility] # [phone]    1. ELIQUIS 5 MG BID   COVER- YES  CO-PAY- $ 10.00  TIER- 3 DRUG  PRIOR APPROVAL- NO    PREFERRED PHARMACY : [facility] AND  [facility]

## 2017-03-07 NOTE — Progress Notes (Addendum)
Progress Note  Patient Name: Jay [name] Date of Encounter: 03/21/2017  Primary Cardiologist: Dr. Sallyanne Kuster  Subjective   No CP, still SOB but he says he thinks he feels slight improved this AM, denies any pain, bleeding at R groin/procedure site  Inpatient Medications    Scheduled Meds: . allopurinol  300 mg Oral Daily  . apixaban  5 mg Oral BID  . atorvastatin  20 mg Oral Daily  . budesonide (PULMICORT) nebulizer solution  0.5 mg Nebulization BID  . carvedilol  6.25 mg Oral BID WC  . fesoterodine  4 mg Oral Daily  . furosemide  80 mg Intravenous BID  . Living Better with Heart Failure Book   Does not apply Once  . loratadine  10 mg Oral Daily  . pantoprazole  40 mg Oral Daily  . potassium chloride  20 mEq Oral BID  . sodium chloride flush  3 mL Intravenous Q12H  . tiotropium  18 mcg Inhalation Daily   Continuous Infusions: . sodium chloride     PRN Meds: sodium chloride, acetaminophen, albuterol, HYDROcodone-acetaminophen, ondansetron (ZOFRAN) IV, sodium chloride flush   Vital Signs    Vitals:   03/05/2017 0100 03/12/2017 0210 03/01/2017 0530 03/02/2017 0733  BP: 113/73  115/70 111/78  Pulse: (!) 107  91 91  Resp: (!) 24  (!) 21   Temp:   98.4 F (36.9 C)   TempSrc:   Oral   SpO2: 97%  95%   Weight:  245 lb 9.6 oz (111.4 kg)    Height:        Intake/Output Summary (Last 24 hours) at 03/10/2017 0844 Last data filed at 03/22/2017 0158 Gross per 24 hour  Intake 635 ml  Output 925 ml  Net -290 ml   Filed Weights   03/05/17 0400 03/25/2017 0701 03/06/2017 0210  Weight: 243 lb 12.8 oz (110.6 kg) 243 lb (110.2 kg) 245 lb 9.6 oz (111.4 kg)    Telemetry    AFlutter 70's-90's - Personally Reviewed  ECG     no new EKGs- Personally Reviewed  Physical Exam   GEN: Remains SOB, though in no acute distress, OOB to chair eating breakfast Neck: No JVD Cardiac: RRR, extrasystoles, no murmurs, rubs, or gallops.  Respiratory: diminished throughout, no wheezes  appreciated today, soft crackles Left base. GI: Soft, nontender, non-distended  MS: 1+ edema R, trace left; No deformity. Neuro:  Nonfocal  Psych: Normal affect   Labs    Chemistry Recent Labs  Lab 03/01/17 0343  03/05/17 0302 03/20/2017 0216 03/12/2017 0651  NA 138   < > 134* 135 135  K 3.8   < > 4.0 3.9 4.4  CL 95*   < > 89* 91* 92*  CO2 34*   < > 36* 34* 34*  GLUCOSE 138*   < > 133* 130* 108*  BUN 15   < > 31* 33* 37*  CREATININE 1.12   < > 1.27* 1.30* 1.37*  CALCIUM 9.2   < > 9.0 8.8* 8.9  PROT 6.3*  --   --   --   --   ALBUMIN 3.2*  --   --   --   --   AST 24  --   --   --   --   ALT 50  --   --   --   --   ALKPHOS 79  --   --   --   --   BILITOT 0.8  --   --   --   --  GFRNONAA >60   < > 58* 56* 53*  GFRAA >60   < > >60 >60 >60  ANIONGAP 9   < > 9 10 9    < > = values in this interval not displayed.     Hematology Recent Labs  Lab 03/05/17 0302 03/23/2017 0216 03/27/2017 0651  WBC 10.4 11.5* 9.8  RBC 3.41* 3.17* 3.07*  HGB 9.9* 9.4* 9.2*  HCT 31.5* 29.7* 29.3*  MCV 92.4 93.7 95.4  MCH 29.0 29.7 30.0  MCHC 31.4 31.6 31.4  RDW 14.7 14.6 15.6*  PLT 399 398 373    Cardiac Enzymes Recent Labs  Lab 03/20/2017 2248 03/01/17 0343 03/01/17 1613  TROPONINI 0.05* 0.05* 0.05*    Recent Labs  Lab 03/24/2017 1040  TROPIPOC 0.02     BNP Recent Labs  Lab 03/27/2017 1028  BNP 367.1*     DDimer No results for input(s): DDIMER in the last 168 hours.   Radiology    No results found.  Cardiac Studies   2D Echo 01/15/17 Study Conclusions - Left ventricle: The cavity size was moderately dilated. Wall thickness was increased in a pattern of mild LVH. Systolic function was mildly to moderately reduced. The estimated ejection fraction was in the range of 40% to 45%. Diffuse hypokinesis. Doppler parameters are consistent with abnormal left ventricular relaxation (grade 1 diastolic dysfunction). Doppler parameters are consistent with high ventricular  filling pressure. - Aortic valve: There was trivial regurgitation. - Left atrium: The atrium was mildly dilated. (71mm) - Right ventricle: The cavity size was mildly dilated. - Right atrium: The atrium was mildly dilated. Impressions: - Mild to moderate global reduction in LV systolic function; mild diastolic dysfunction with elevated LV fillin pressure; moderate LVE; mild LVH; mild biatrial enlargement; mild RVE.    Patient Profile     66 y.o. male with a hx of CAD/CABG 2007, cath 2008 2/4 were occluded, last ischemic evaluation by stress ini 2016 noted scar only, no ischemia, chronic CHF (ischemic/nonischemic) last EF 40-45%, known PVCs, (saw Dr. Lovena Le in consult 2016 for syncope,  NSVT and PVCs), HTN, severe COPD (has home O2, rarely uses), OSA w/CPAP, anemia with dx of chron's colitis, Sept 2017 required transfusion, admitted with CHF exacerbation and AFlutter  Assessment & Plan    1. SOB  2. CM (mixed), CHF exacerbation 3. Severe COPD, has home O2 though reports at baseline infrequent need     SOB remains though he says he feels a little better this AM  Weight down 13lbs by today's weight from admission Fluid negative cumulatively -8178ml  Recommend continued aggressive diuresis as renal function allows, will defer to primary cardiac team ongoing management   3. AFlutter, onset weeks ago     Appears typical     CHA2DS2Vasc is at least 3, not on a/c 2/2 hx of GIB (pt declined)     D/w patient need for at least short term a/c for ablation, he is agreeable, understands rational discussed, he denies any GI issues since January of this year.     Eliquis started this admission     S/p TEE >> EPS/Ablation of his AFlutter yesterday with Dr. Marlowe Sax     Remains in SR this AM, frequent PVCs (has hx of this)     Needs to continue Eliquis uninterrupted   EP follow up has been arranged Activity restrictions/site care were reviewed with the patient   4. HTN     Looks  OK with current regime  EP service remains available, please recall if needed.     For questions or updates, please contact [place] Please consult [email or web address] for contact info under Cardiology/STEMI.      Signed, Baldwin Jamaica, PA-C  03/21/2017, 8:44 AM     I have seen, examined the patient, and reviewed the above assessment and plan.  Changes to above are made where necessary.  Doing very well s/p atrial flutter ablation.  Remains volume overloaded.  Would continue IV diuresis for another 24 hours.  Given acutely depressed EF (likely due to atrial flutter), would continue coreg and add ACe inhinitor/ ARB if not contraindicated.  General cardiology to follow with you.  Do not miss any eliquis for any reason for the next 4 weeks post ablation.  Follow-up with me in the office in 4 weeks  Electrophysiology team to see as needed while here. Please call with questions.   Co Sign: Thompson Grayer, MD 03/15/2017 10:58 AM

## 2017-03-08 ENCOUNTER — Inpatient Hospital Stay (HOSPITAL_COMMUNITY): Payer: Medicare HMO

## 2017-03-08 LAB — COMPREHENSIVE METABOLIC PANEL
ALBUMIN: 2.9 g/dL — AB (ref 3.5–5.0)
ALK PHOS: 86 U/L (ref 38–126)
ALT: 47 U/L (ref 17–63)
AST: 40 U/L (ref 15–41)
Anion gap: 10 (ref 5–15)
BILIRUBIN TOTAL: 0.9 mg/dL (ref 0.3–1.2)
BUN: 53 mg/dL — AB (ref 6–20)
CALCIUM: 9.1 mg/dL (ref 8.9–10.3)
CO2: 33 mmol/L — AB (ref 22–32)
Chloride: 89 mmol/L — ABNORMAL LOW (ref 101–111)
Creatinine, Ser: 2.59 mg/dL — ABNORMAL HIGH (ref 0.61–1.24)
GFR calc Af Amer: 28 mL/min — ABNORMAL LOW (ref >60)
GFR calc non Af Amer: 25 mL/min — ABNORMAL LOW (ref >60)
GLUCOSE: 127 mg/dL — AB (ref 65–99)
Potassium: 4.9 mmol/L (ref 3.5–5.1)
SODIUM: 132 mmol/L — AB (ref 135–145)
TOTAL PROTEIN: 6 g/dL — AB (ref 6.5–8.1)

## 2017-03-08 LAB — CBC
HEMATOCRIT: 28 % — AB (ref 39.0–52.0)
HEMOGLOBIN: 8.5 g/dL — AB (ref 13.0–17.0)
MCH: 28.5 pg (ref 26.0–34.0)
MCHC: 30.4 g/dL (ref 30.0–36.0)
MCV: 94 fL (ref 78.0–100.0)
Platelets: 379 10*3/uL (ref 150–400)
RBC: 2.98 MIL/uL — AB (ref 4.22–5.81)
RDW: 14.9 % (ref 11.5–15.5)
WBC: 13 10*3/uL — AB (ref 4.0–10.5)

## 2017-03-08 LAB — LACTIC ACID, PLASMA: Lactic Acid, Venous: 1.1 mmol/L (ref 0.5–1.9)

## 2017-03-08 MED ORDER — SODIUM CHLORIDE 0.9 % IV BOLUS (SEPSIS): 250.0000 mL | Freq: Once | INTRAVENOUS | Status: DC

## 2017-03-30 NOTE — Progress Notes (Addendum)
2145- resting in chair, unable to get comfortable. Assisted back to bed with 1 assist. VSS. Denies pain or any needs at this time.   2245- Asleep on right side in no visible distress. Respirations shallow/unlabored. VSS; O2-94% on 4L [place].   2350- Woke pt from sleep to administer scheduled meds. PO meds administered without difficulty (see MAR). Dyspnea noted upon assessment. Inquired if ready for RT to place CPAP. Pt agreeable. RT notified.    00:10- VSS; O2 fluctuating 87-95% on 4L [place] while awaiting RT to place CPAP. Continues to deny pain.   00:35- BP-84/44 (monitor). Obtained manual BP-78/46. Shallow/labored respirations with increasing dyspnea at rest. Encouraged slow, deep breaths in nose/out mouth. Placed 2nd call to RT informing of increased dyspnea/shallow/labored breathing/inability to get comfortable. RT in route.   00:45- RT @ bedside. CPAP placed. O2 sats stable.  [00:50-1:10am]- BP cycled q5 min's with ongoing/worsening hypotension. Remains asymptomatic denying dizziness/light-headedness. Tolerating CPAP with no issue reporting mask is "tolerable tonight." Respirations even/unlabored, with no dyspnea observed. Neuro intact; A&O following commands. Skin warm and dry. Diminished lung sounds auscultated throughout.   1am- sats remain stable on CPAP, 88-93%, however, BP is continuing to drop; current- 68/28 (MAP-40). In house cards fellow paged. Charge nurse updated on status and pending call back from cardiology.  1:10am- Manual BP-64/40. Charge nurse notified & rapid response called. Continues to be asymptomatic, resting in no visible distress. A&O, following commands when prompted.   1:13am- RRT to bedside; assessed pt. Remains hypotensive.   1:28am- Call back received from cards fellow. Notified s/p ablation 11/7, right groin site level 0. Brief history/hospital course provided leading into current status. Fellow to come see pt.  1:40am- Cards fellow to Bedside. Post assessment &  discussion, concluded no fluid resuscitation warranted @ this time, d/t hx CHF & remaining asymptomatic.    1:45am- Primary team paged. Call back received. Dr. Grandville Silos informed of ongoing/worsening hypotension, respiratory distress prior to CPAP. No orders received.  2am- BP-64/42. Teaching service paged. Dr. Grandville Silos returned page. Expressed concern with BP remaining so low regardless of pt remaining asymptomatic. Requested MD to bedside for further guidance on care.  2:30am- Dr. Legrand Como to bedside. MD, rapid response nurse, & myself reviewed & discussed pt status. CBC, CMP, & lactic acid ordered.   2:35am- SPO2- 93%-CPAP. Resting but easy to arouse. Continues to answer questions appropriately; A&O.  2:39am- Labs collected.  2:43am- Requests bedpan stating he's going to have BM. Placed on bedpan x2 assist. Pt states he needs privacy and asks staff to exit room.  2:58am- Antonique, RN, entered room to check on pt. Pt noted in bed still using bedpan. States he's not finished & needs more time.   3:01am: This RN began to enter room and did not see pt in bed. Inquired w/ Antonique (RN covering my patients to allow me 10 mins off unit to eat) if pt was in restroom. Simultaneously code blue phone rang. At that time, I scanned the room and noted pt face-down, CPAP still in place, on the other side of the bed. Called for STAT assistance. Pupils fixed & dilated upon initial assessment. Skin dusky/grey. Large amount of odorous stool noted on floor & chuck in bed.  3:02am- With assistance from several other staff, pt turned onto his back. Pt was not breathing and did not have a pulse. Per pt wishes with known DNR code status, resuscitation efforts were not initiated. Time of death pronounced by this RN and Glenford Peers, RN. Dr.  Thompson notified and post-mortem care initiated. Provided MD with phone numbers for emergency contacts, [facility] (ex-wife) & Jadene Pierini (mother). MD to  reach out and notify to family.    5:15am- [facility] contacted for new referral (see post-mortem checklist for details).  6:30am- Family arrived to unit. Dr. Grandville Silos notified as requested upon their arrival. MD discussed hospital course and informed family of death. Belongings gathered and returned to family. Funeral arrangement information gathered and updated in chart. Family did not wish to see pt corpse (remained in room [id number] while awaiting family arrival). Family denied any questions or concerns prior to departure.   7am- Body transferred to morgue.

## 2017-03-30 NOTE — Death Summary Note (Signed)
DEATH SUMMARY   Patient Details  Name: Jay [name] MRN: [id number] DOB: 02-12-1951  Admission/Discharge Information   Admit Date:  03/24/2017  Date of Death: Date of Death: April 01, 2017  Time of Death: Time of Death: 0302  Length of Stay: 8  Referring Physician: Cathlean Sauer, MD   Reason(s) for Hospitalization  Respiratory failure in setting of afib/copd/chf  Diagnoses  Preliminary cause of death:  Secondary Diagnoses (including complications and co-morbidities):  Principal Problem:   Atrial flutter ([place]) Active Problems:   Cardiomyopathy, ischemic, ejection fraction 40-45%   Sleep apnea   COPD (chronic obstructive pulmonary disease) ([place])   Acute exacerbation of CHF (congestive heart failure) ([facility])   CHF (congestive heart failure) ([place])   SOB (shortness of breath)   Brief Hospital Course (including significant findings, care, treatment, and services provided and events leading to death)  Jay [name] is a 66 y.o. year old male who was admitted for acute respiratory failure in the setting of Afib w/o RVR and COPD with CHF fluid overload.  Was rate controlled, diuresed and underwent an ablation procedure as his afib was persistent.  Overnight on 11/8 into morning of April 02, 2023 he had a rapid response called to his room and was stabilized.   He wanted to use the restroom afterward and asked for privacy.  He was on telemonitoring and later his nurse observed his heart monitor show his heart rate rapidly slow and stop. He did not have a pulse and was not breathing when staff got to his room and per his wishes, which were confirmed multiple times during the hospitalization, resuscitation was not attempted.    Pertinent Labs and Studies  Significant Diagnostic Studies Dg Chest 2 View  Result Date: 03/03/2017 CLINICAL DATA:  66 year old male with shortness of Breath for 4 days. EXAM: CHEST  2 VIEW COMPARISON:  March 24, 2017 and earlier. FINDINGS: AP and lateral views of the chest.  Stable cardiomegaly and mediastinal contours. Prior CABG. Stable lung volumes. No pneumothorax or consolidation. Small bilateral pleural effusions have regressed. Decreased pulmonary vascularity. No pulmonary edema. No acute osseous abnormality identified. Negative visible bowel gas pattern. IMPRESSION: 1. Regressed small bilateral pleural effusions and pulmonary vascular congestion since 03-24-17. 2. Stable cardiomegaly.  No new cardiopulmonary abnormality. Electronically Signed   By: Genevie Ann M.D.   On: 03/03/2017 14:12   Dg Chest 2 View  Result Date: 03/24/17 CLINICAL DATA:  Shortness of breath, CHF, worse over the last month EXAM: CHEST  2 VIEW COMPARISON:  Chest x-ray of 02/20/2017 FINDINGS: There is cardiomegaly present with some fullness of the perihilar vasculature possibly indicating mild pulmonary vascular congestion, without definite congestive heart failure. Tiny pleural effusions cannot be excluded with some blunting of the posterior costophrenic angles. No pneumonia is seen. Median sternotomy sutures are present from prior CABG. No acute bony abnormality is seen. IMPRESSION: Cardiomegaly.  Probable mild pulmonary vascular congestion. Electronically Signed   By: Ivar Drape M.D.   On: 03-24-17 11:07   Dg Chest Portable 1 View  Result Date: 02/20/2017 CLINICAL DATA:  66 year old male with increased heart rate and shortness of breath for the past 4 days. EXAM: PORTABLE CHEST 1 VIEW COMPARISON:  Chest x-ray 02/15/2015. FINDINGS: Lung volumes are normal. No consolidative airspace disease. No pleural effusions. Mild linear scarring in the mid lungs bilaterally, similar to the prior examinations. Mild pulmonary venous congestion, without frank pulmonary edema. Mild cardiomegaly. Upper mediastinal contours are within normal limits. Aortic atherosclerosis. Status post median sternotomy for CABG. Several  broken upper median sternotomy wires are again noted. IMPRESSION: 1. No radiographic evidence  of acute cardiopulmonary disease. 2. Cardiomegaly. 3. Aortic atherosclerosis. Electronically Signed   By: Vinnie Langton M.D.   On: 02/20/2017 11:54    Microbiology Recent Results (from the past 240 hour(s))  MRSA PCR Screening     Status: None   Collection Time: 03/02/17  5:12 AM  Result Value Ref Range Status   MRSA by PCR NEGATIVE NEGATIVE Final    Comment:        The GeneXpert MRSA Assay (FDA approved for NASAL specimens only), is one component of a comprehensive MRSA colonization surveillance program. It is not intended to diagnose MRSA infection nor to guide or monitor treatment for MRSA infections.     Lab Basic Metabolic Panel: Recent Labs  Lab 03/02/17 1618  03/04/17 0304 03/05/17 0302 03/16/2017 0216 03/26/2017 0651 03-09-2017 0243  NA  --    < > 132* 134* 135 135 132*  K  --    < > 3.8 4.0 3.9 4.4 4.9  CL  --    < > 87* 89* 91* 92* 89*  CO2  --    < > 34* 36* 34* 34* 33*  GLUCOSE  --    < > 119* 133* 130* 108* 127*  BUN  --    < > 25* 31* 33* 37* 53*  CREATININE  --    < > 1.26* 1.27* 1.30* 1.37* 2.59*  CALCIUM  --    < > 8.8* 9.0 8.8* 8.9 9.1  MG 1.5*  --   --   --   --   --   --    < > = values in this interval not displayed.   Liver Function Tests: Recent Labs  Lab 03-09-2017 0243  AST 40  ALT 47  ALKPHOS 86  BILITOT 0.9  PROT 6.0*  ALBUMIN 2.9*   No results for input(s): LIPASE, AMYLASE in the last 168 hours. No results for input(s): AMMONIA in the last 168 hours. CBC: Recent Labs  Lab 03/04/17 0304 03/05/17 0302 03/12/2017 0216 03/29/2017 0651 09-Mar-2017 0243  WBC 9.7 10.4 11.5* 9.8 13.0*  HGB 9.7* 9.9* 9.4* 9.2* 8.5*  HCT 30.7* 31.5* 29.7* 29.3* 28.0*  MCV 93.6 92.4 93.7 95.4 94.0  PLT 367 399 398 373 379   Cardiac Enzymes: Recent Labs  Lab 03/01/17 1613  TROPONINI 0.05*   Sepsis Labs: Recent Labs  Lab 03/05/17 0302 03/10/2017 0216 03/21/2017 0651 2017-03-09 0243  WBC 10.4 11.5* 9.8 13.0*  LATICACIDVEN  --   --   --  1.1     Procedures/Operations  Ablation w/ TEE   Sherene Sires 03/09/2017, 8:11 AM

## 2017-03-30 NOTE — Progress Notes (Signed)
Patient found deceased on floor at bedside no pulse at 3:02 a.m. Patient code status was DNR. Patient recently seen by rapid response due to low blood pressure. Cardiology and Dr. Grandville Silos from family medicine had been to the room to evaluate patient. Patient admitted for COPD, CHF, and had an ablation yesterday. Medical examiner called to determine if patient was a ME case. Spoke to [employer] with [facility] office and he said circumstances did not meet guidelines for an ME case.

## 2017-03-30 NOTE — Progress Notes (Addendum)
FPTS Interim Progress Note  S:Patient has been hypotensive 70s/50s, dispute multiple rechecks. He is not in any pain. Pt was having difficulty breathing, but improved when he used his  CPAP. He is AxO3  O: BP (!) 77/46   Pulse 87   Temp 98.8 F (37.1 C) (Oral)   Resp 19   Ht 5\' 9"  (1.753 m)   Wt 245 lb 9.6 oz (111.4 kg)   SpO2 95%   BMI 36.27 kg/m   CVS: hypotensive, rrr, no mrg Lungs: CTAB MSK: 1+ edema, extremities appropriately warm. 2+dp  A/P: Pt is receiving dieresis due to CHF exacerbation. Trace edema, but does not appear overly fluid overloaded. Will check lactic acid to assess for infectious causes possibly leading to Shock. Will check CMP to check albumin levels to assess for third spacing and Cr to assess renal function. To date, pt has not had elevated WBC or been febrile during this admission. Will give gentle fluid bolus due to CHF (EF 30-35%) and repeat chest xray.  If lactic acid elevated, will consider infectious work up including CXR, blood cultures x2, UA and urine culture and broad spectrum antibiotics.   Bonnita Hollow, MD 03-30-17, 2:49 AM PGY-1, [facility] pager [phone]

## 2017-03-30 NOTE — Progress Notes (Signed)
RT called by RN to place Pt on CPAP. Placed pt on CPAP with BILEVEL setting of 18/5 with 5L bled through. Pt tolerating well, Vital signs stable. RT to monitor.

## 2017-03-30 NOTE — Progress Notes (Signed)
Received page of patient death. Upon speaking to RN, patient had requested be on bedpan for BM and was mentating well.  He helped assist the nurse reposition himself on the bed. He then asked for a moment of privacy to use the restroom. A few moments later after the nurse stepped outside, the patient was observed on telemetry to have his heart rate dropping. The nurse then went to check on the patient where she discovered him on floor, pulseless. He was a DNR. Code blue was not initiated.   Family to be called and informed.  Attending physician notified.

## 2017-03-30 NOTE — Progress Notes (Addendum)
Called by charge RN about patient having low blood pressures SBP 60-80s, checked manually by primary RN, I arrived at 113.  Patient was resting with CPAP on, + neuro intact, alert, follows commands, breathing comfortably on CPAP, sats were anywhere from 86-93%, skin warm and dry, trace edema in extremities, lung sounds diminished but good air movement overall.   HR maintained in the 90s SR with PVCs.  Cardiology was paged first, patient had an ablation done yesterday, groin site was intact per nursing.  Cards Fellow at bedside, patient was assessed and primary team was contact.  FMTS was contacted, I updated the resident personally as well ( we were both called away to another RRT call).    FMTS resident and I returned, patient was assessed again, we agreed that patient did not appear symptomatic from the low blood pressures.  Given that earlier in the night, per his RN, he did have some respiratory distress and needed to be placed on CPAP, labs were ordered  ( CMP and Lactic) to see if fluid resuscitation was needed. Patient was bladder scanned ~ 200cc present.   Labs were ordered, waiting for further orders once labs result.   End Time 230

## 2017-03-30 DEATH — deceased

## 2017-04-04 ENCOUNTER — Ambulatory Visit: Payer: Medicare HMO | Admitting: Cardiovascular Disease

## 2017-04-08 ENCOUNTER — Ambulatory Visit: Payer: Medicare HMO | Admitting: Internal Medicine

## 2017-05-06 IMAGING — NM NM MISC PROCEDURE
6 series · 36 of 36 positions shown · non-contrast
Comparison: none

[Series 1: wbr rest · 6.40mm/px · 6 of 61 frames shown]
[frame 6/61]
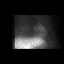
[frame 16/61]
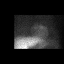
[frame 26/61]
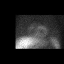
[frame 36/61]
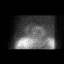
[frame 46/61]
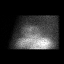
[frame 56/61]
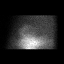

[Series 1: wbr_r-proj_st wbr rest · 6.40mm/px · 6 of 64 frames shown]
[frame 6/64]
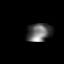
[frame 16/64]
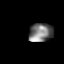
[frame 27/64]
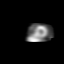
[frame 38/64]
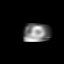
[frame 48/64]
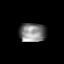
[frame 59/64]
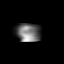

[Series 2: wbr stress-gsp · 6.40mm/px · 6 of 378 frames shown]
[frame 32/378  full-range]
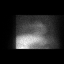
[frame 95/378  full-range]
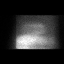
[frame 158/378  full-range]
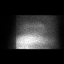
[frame 221/378  full-range]
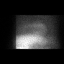
[frame 284/378  full-range]
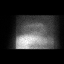
[frame 347/378  full-range]
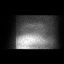

[Series 2: wbr_s-proj_st wbr stress-gsp · 6.40mm/px · 6 of 512 frames shown]
[frame 43/512]
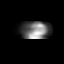
[frame 128/512]
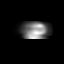
[frame 214/512]
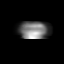
[frame 299/512]
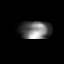
[frame 384/512]
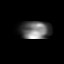
[frame 470/512]
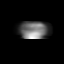

[Series 3: wbr stress-sum-em · 6.40mm/px · 6 of 64 frames shown]
[frame 6/64]
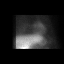
[frame 16/64]
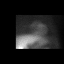
[frame 27/64]
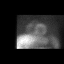
[frame 38/64]
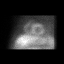
[frame 48/64]
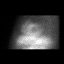
[frame 59/64]
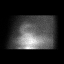

[Series 3: wbr_s-proj_st wbr stress-sum-em · 6.40mm/px · 6 of 64 frames shown]
[frame 6/64]
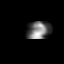
[frame 16/64]
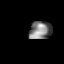
[frame 27/64]
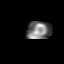
[frame 38/64]
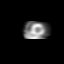
[frame 48/64]
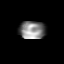
[frame 59/64]
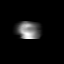

[36 of 36 positions shown; findings below may reference images not displayed]

Canned report from images found in remote index.

Refer to host system for actual result text.
